# Patient Record
Sex: Female | Born: 1975 | Race: White | Hispanic: No | Marital: Married | Smoking: Never smoker
Health system: Southern US, Community
[De-identification: ages and names within clinical notes are randomized; demographics above are authoritative.]

## PROBLEM LIST (undated history)

## (undated) DIAGNOSIS — N390 Urinary tract infection, site not specified: Secondary | ICD-10-CM

## (undated) DIAGNOSIS — Z992 Dependence on renal dialysis: Secondary | ICD-10-CM

## (undated) DIAGNOSIS — T8859XA Other complications of anesthesia, initial encounter: Secondary | ICD-10-CM

## (undated) DIAGNOSIS — N2581 Secondary hyperparathyroidism of renal origin: Secondary | ICD-10-CM

## (undated) DIAGNOSIS — F419 Anxiety disorder, unspecified: Secondary | ICD-10-CM

## (undated) DIAGNOSIS — IMO0001 Reserved for inherently not codable concepts without codable children: Secondary | ICD-10-CM

## (undated) DIAGNOSIS — G43909 Migraine, unspecified, not intractable, without status migrainosus: Secondary | ICD-10-CM

## (undated) DIAGNOSIS — Z87442 Personal history of urinary calculi: Secondary | ICD-10-CM

## (undated) DIAGNOSIS — R519 Headache, unspecified: Secondary | ICD-10-CM

## (undated) DIAGNOSIS — T4145XA Adverse effect of unspecified anesthetic, initial encounter: Secondary | ICD-10-CM

## (undated) DIAGNOSIS — R51 Headache: Secondary | ICD-10-CM

## (undated) DIAGNOSIS — I10 Essential (primary) hypertension: Secondary | ICD-10-CM

## (undated) DIAGNOSIS — M1990 Unspecified osteoarthritis, unspecified site: Secondary | ICD-10-CM

## (undated) DIAGNOSIS — K219 Gastro-esophageal reflux disease without esophagitis: Secondary | ICD-10-CM

## (undated) DIAGNOSIS — E875 Hyperkalemia: Secondary | ICD-10-CM

## (undated) DIAGNOSIS — N186 End stage renal disease: Secondary | ICD-10-CM

## (undated) DIAGNOSIS — D509 Iron deficiency anemia, unspecified: Secondary | ICD-10-CM

## (undated) DIAGNOSIS — Z87440 Personal history of urinary (tract) infections: Secondary | ICD-10-CM

## (undated) HISTORY — PX: KIDNEY TRANSPLANT: SHX239

## (undated) HISTORY — DX: Essential (primary) hypertension: I10

## (undated) HISTORY — DX: Secondary hyperparathyroidism of renal origin: N25.81

## (undated) HISTORY — DX: Urinary tract infection, site not specified: N39.0

## (undated) HISTORY — PX: DILATION AND CURETTAGE OF UTERUS: SHX78

---

## 1998-09-30 ENCOUNTER — Emergency Department (HOSPITAL_COMMUNITY): Admission: EM | Admit: 1998-09-30 | Discharge: 1998-09-30 | Payer: Self-pay | Admitting: Emergency Medicine

## 1999-02-24 ENCOUNTER — Encounter: Payer: Self-pay | Admitting: Emergency Medicine

## 1999-02-24 ENCOUNTER — Emergency Department (HOSPITAL_COMMUNITY): Admission: EM | Admit: 1999-02-24 | Discharge: 1999-02-24 | Payer: Self-pay | Admitting: Emergency Medicine

## 1999-08-26 ENCOUNTER — Encounter: Payer: Self-pay | Admitting: Emergency Medicine

## 1999-08-26 ENCOUNTER — Emergency Department (HOSPITAL_COMMUNITY): Admission: EM | Admit: 1999-08-26 | Discharge: 1999-08-26 | Payer: Self-pay | Admitting: Emergency Medicine

## 2000-04-01 ENCOUNTER — Emergency Department (HOSPITAL_COMMUNITY): Admission: EM | Admit: 2000-04-01 | Discharge: 2000-04-01 | Payer: Self-pay | Admitting: Emergency Medicine

## 2001-03-09 ENCOUNTER — Encounter: Payer: Self-pay | Admitting: Obstetrics and Gynecology

## 2001-03-09 ENCOUNTER — Encounter (INDEPENDENT_AMBULATORY_CARE_PROVIDER_SITE_OTHER): Payer: Self-pay | Admitting: Specialist

## 2001-03-09 ENCOUNTER — Ambulatory Visit (HOSPITAL_COMMUNITY): Admission: AD | Admit: 2001-03-09 | Discharge: 2001-03-09 | Payer: Self-pay | Admitting: Obstetrics and Gynecology

## 2001-04-24 ENCOUNTER — Inpatient Hospital Stay (HOSPITAL_COMMUNITY): Admission: EM | Admit: 2001-04-24 | Discharge: 2001-04-28 | Payer: Self-pay | Admitting: Emergency Medicine

## 2001-04-27 ENCOUNTER — Encounter: Payer: Self-pay | Admitting: Internal Medicine

## 2001-05-09 HISTORY — PX: TUBAL LIGATION: SHX77

## 2001-08-13 ENCOUNTER — Emergency Department (HOSPITAL_COMMUNITY): Admission: EM | Admit: 2001-08-13 | Discharge: 2001-08-13 | Payer: Self-pay

## 2001-09-07 ENCOUNTER — Encounter: Payer: Self-pay | Admitting: Emergency Medicine

## 2001-09-07 ENCOUNTER — Emergency Department (HOSPITAL_COMMUNITY): Admission: EM | Admit: 2001-09-07 | Discharge: 2001-09-07 | Payer: Self-pay

## 2003-05-10 HISTORY — PX: LAPAROSCOPIC CHOLECYSTECTOMY: SUR755

## 2004-05-09 HISTORY — PX: ABDOMINAL HYSTERECTOMY: SHX81

## 2005-03-20 ENCOUNTER — Emergency Department: Payer: Self-pay | Admitting: Unknown Physician Specialty

## 2005-11-09 ENCOUNTER — Emergency Department: Payer: Self-pay | Admitting: Emergency Medicine

## 2005-11-21 ENCOUNTER — Emergency Department: Payer: Self-pay | Admitting: Unknown Physician Specialty

## 2007-02-03 ENCOUNTER — Emergency Department (HOSPITAL_COMMUNITY): Admission: EM | Admit: 2007-02-03 | Discharge: 2007-02-03 | Payer: Self-pay | Admitting: Emergency Medicine

## 2007-09-28 ENCOUNTER — Emergency Department (HOSPITAL_COMMUNITY): Admission: EM | Admit: 2007-09-28 | Discharge: 2007-09-28 | Payer: Self-pay | Admitting: Emergency Medicine

## 2008-02-29 ENCOUNTER — Emergency Department (HOSPITAL_COMMUNITY): Admission: EM | Admit: 2008-02-29 | Discharge: 2008-02-29 | Payer: Self-pay | Admitting: Emergency Medicine

## 2008-06-16 ENCOUNTER — Ambulatory Visit (HOSPITAL_COMMUNITY): Admission: RE | Admit: 2008-06-16 | Discharge: 2008-06-16 | Payer: Self-pay | Admitting: Neurosurgery

## 2009-04-17 ENCOUNTER — Emergency Department (HOSPITAL_COMMUNITY): Admission: EM | Admit: 2009-04-17 | Discharge: 2009-04-17 | Payer: Self-pay | Admitting: *Deleted

## 2010-02-16 ENCOUNTER — Emergency Department (HOSPITAL_COMMUNITY): Admission: EM | Admit: 2010-02-16 | Discharge: 2010-02-16 | Payer: Self-pay | Admitting: Emergency Medicine

## 2010-05-09 HISTORY — PX: SHOULDER ARTHROSCOPY W/ ROTATOR CUFF REPAIR: SHX2400

## 2010-07-22 LAB — CBC
HCT: 39.9 % (ref 36.0–46.0)
MCH: 31.5 pg (ref 26.0–34.0)
MCV: 90.8 fL (ref 78.0–100.0)
Platelets: 284 10*3/uL (ref 150–400)
RBC: 4.4 MIL/uL (ref 3.87–5.11)
WBC: 6.5 10*3/uL (ref 4.0–10.5)

## 2010-07-22 LAB — URINE CULTURE
Colony Count: NO GROWTH
Culture  Setup Time: 201110120140

## 2010-07-22 LAB — BASIC METABOLIC PANEL
BUN: 11 mg/dL (ref 6–23)
CO2: 23 mEq/L (ref 19–32)
Chloride: 108 mEq/L (ref 96–112)
Creatinine, Ser: 1.43 mg/dL — ABNORMAL HIGH (ref 0.4–1.2)
GFR calc Af Amer: 51 mL/min — ABNORMAL LOW (ref >60)
Potassium: 3 mEq/L — ABNORMAL LOW (ref 3.5–5.1)

## 2010-07-22 LAB — URINE MICROSCOPIC-ADD ON

## 2010-07-22 LAB — URINALYSIS, ROUTINE W REFLEX MICROSCOPIC
Bilirubin Urine: NEGATIVE
Glucose, UA: NEGATIVE mg/dL
Hgb urine dipstick: NEGATIVE
Ketones, ur: NEGATIVE mg/dL
Protein, ur: NEGATIVE mg/dL
pH: 6.5 (ref 5.0–8.0)

## 2010-07-22 LAB — DIFFERENTIAL
Eosinophils Absolute: 0 10*3/uL (ref 0.0–0.7)
Eosinophils Relative: 1 % (ref 0–5)
Lymphocytes Relative: 22 % (ref 12–46)
Lymphs Abs: 1.4 10*3/uL (ref 0.7–4.0)
Monocytes Absolute: 0.4 10*3/uL (ref 0.1–1.0)

## 2010-09-24 NOTE — Op Note (Signed)
[place]  Patient:    Valerie Yates, Valerie Yates Visit Number: [id number] MRN: [id number]          Service Type: OBS Location: [place] Attending Physician:  Renella Cunas Dictated by:   Daleen Bo Lyn Hollingshead, M.D. Proc. Date: 03/09/01 Admit Date:  03/09/2001 Discharge Date: 03/09/2001                             Operative Report  PREOPERATIVE DIAGNOSIS:       Incomplete abortion.  POSTOPERATIVE DIAGNOSIS:      Incomplete abortion.  OPERATION:                    Dilatation and evacuation.  SURGEON:                      Daleen Bo. Lyn Hollingshead, M.D.  ANESTHESIA:                   MAC with 1% Xylocaine paracervical block -                               Heriberto Antigua, M.D.  ESTIMATED BLOOD LOSS:         Less than 50 cc.  INDICATIONS AND CONSENT:      The patient is a 35 year old single white female, gravida 2, para 0, SAB 1, with an incomplete abortion.  Details are dictated in the History and Physical.  Blood type is Rh-positive.  Dilatation and evacuation was discussed with the patient.  Potential risks and complications have been discussed including, but not limited to infection, uterine perforation, bladder, bladder, or ureteral damage, bleeding requiring transfusion of blood products with possible transfusion reaction of HIV and hepatitis acquisition, DVT, PE, and pneumonia, hysterectomy, and synechiae formation.  All questions were answered and consent signed on the chart.  DESCRIPTION OF PROCEDURE:     The patient was taken to the operating room where she was given sedation.  She was then placed in the dorsolithotomy position where she was gently prepped.  The bladder was straight catheterized and she was draped in a sterile fashion.  The patient had been given IV antibiotics preoperatively.  Bivalve speculum was placed in the vagina and the anterior cervical lip was injected with 1% Xylocaine.  It was then grasped with the single tooth  tenaculum.  A total of 20 cc of 1% plain Xylocaine was used to place a paracervical block at the 2, 4, 5, 7, 8, and 10 oclock positions.  The cervix was gently progressively dilated to a 31 Pratt dilator. A #7 curved curet is placed in the uterine cavity and suction curettage was carried out.  Alternating suction and sharp curettage was carried out until the cavity feels clean.  Twenty units of Pitocin are added to a new bag of IV fluids after the initial pass of the suction curet.  Good hemostasis is noted. All instruments are removed.  All counts are correct.  The patient is awakened and taken to the recovery room in stable condition. Blood type is Rh-positive.  The patient will be discharged home on Methergine 0.2 mg p.o. t.i.d. for two days, Darvocet-N 100 #6 one p.o. q.6h. p.r.n., ibuprofen 600 mg p.o. q.6h. p.r.n., and doxycycline 100 mg p.o. b.i.d. Followup is in the office in two weeks. Dictated by:  Daleen Bo Lyn Hollingshead, M.D. Attending Physician:  Renella Cunas DD:  03/09/01 TD:  03/12/01 Job: [id number]

## 2010-09-24 NOTE — Discharge Summary (Signed)
[place]  Patient:    Valerie Yates, Valerie Yates Visit Number: [id number] MRN: [id number]          Service Type: MED Location: [id number] Attending Physician:  Thomes Lolling Dictated by:   Harlow Asa, M.D. Admit Date:  04/24/2001 Discharge Date: 04/28/2001                             Discharge Summary  PROBLEM LIST:  Pyelonephritis.  MEDICATIONS AT DISCHARGE: 1. Tequin 400 mg x5 days. 2. Vicodin 5/500 one tablet q.6h. p.r.n. pain. 3. Tylenol as needed for pain.  The patient was reminded that Vicodin has    Tylenol in it and to include this in the total amount of Tylenol she was    taking in a day. 4. Phenergan 25 mg q.6h. p.r.n. pain.  ADMISSION HISTORY:  Ms. Valerie Yates is a 35 year old woman with little significant past medical history who came to the emergency room with three to four day history of increasing frequency, urgency and dysuria in addition to headache, fever and chills.  These came on very suddenly and got much worse in the time before the admission. She had several episodes of nausea and vomiting the night before admission and was unable to keep anything down. She also reported right flank pain and abdominal pain over the same time.  The pain was crampy but constant.  She reported a history of urinary tract infections but never anything quite that severe.  PHYSICAL EXAMINATION:  GENERAL APPEARANCE:  This is a 35 year old woman in moderate distress. She was sleepy but oriented and appropriate.  VITAL SIGNS:  Admission temperature 102.5 with T-max of 104, blood pressure 122/80, pulse 140, respiratory rate 20, O2 sat 100% on room air.  CHEST:  Clear to auscultation bilaterally, positive CVA tenderness on the right.  CARDIOVASCULAR:  Her heart was tachy but regular.  ABDOMEN:  Tender throughout the right side to deep palpation but was nondistended and had positive bowel sounds and no masses or  organomegaly.  EXTREMITIES: There was no edema.  The remainder of the exam was unremarkable except that she was multiply tattooed.  LABS ON ADMISSION:  White count 7.9, hemoglobin 14.3, platelets 246.  Sodium 135, potassium 3.0, chloride 103, bicarb 22, BUN 5, creatinine 0.7, glucose 120.  HOSPITAL COURSE BY PROBLEM LIST:  FEVER/CHILLS/FLANK PAIN:  This was consistent with pyelonephritis, almost certainly caused by filiform bacteria. She was admitted to the hospital and started on Tequin 400 mg IV q.d. which will be changed to p.o. when she was able to tolerate it.  Blood and urine cultures were also drawn.  Her relative leukopenia of 7.9 was odd given her apparent pyelonephritis.  She consented to an HIV test which was negative.  On the day after admission, her T-max was 101.2 and she felt somewhat better. She had some nausea and vomiting overnight.  Blood and urine cultures were still pending.  On April 26, 2001, she continued to improve slowly and her headache became worse.  She was started on MS-Contin for pain control with IV morphine for breakthrough pain.  On April 27, 2001, the headache remained and had not gotten any better but the pain in her abdomen and flank was nearly gone.  Urine culture showed E. coli and Klebsiella, both of which were broadly sensitive.  Her T-max was 102.4.  Despite her headaches, her neck was supple and she showed no additional signs  of meningitis.  She later complained of point tenderness in the back of her head and epidural abscess was thought to be a remote possibility so an MRI of her head was ordered.  MRI of the head was performed and did not show an epidural abscess. However, it did show a 6 mm nonenhancing abnormality in the left centrum semiovale which followed a fluid signal.  Differential through the radiologist for this included a small area of encephalomalacia secondary to previous trauma or ischemia or possibly a neural  epithelial cyst of benign significance and abscess was thought to be unlikely and the low grade neoplastic process could not be entirely excluded.  The recommendation was to follow up an MRI in four to six months.  With the MRI showing no acute abnormalities and her being afebrile overnight on the April 27, 2001, she was discharged home on April 28, 2001, with a home course of Tequin.  FOLLOW-UP:  Ms. Valerie Yates was to call [phone] to make an appointment in January to see Harlow Asa, M.D., for routine care and for follow-up.  She was also strongly encouraged to remember to follow up on the MRI in four to six months. Dictated by:   Harlow Asa, M.D. Attending Physician:  Thomes Lolling DD:  06/12/01 TD:  06/12/01 Job: [id number]

## 2010-09-24 NOTE — H&P (Signed)
[facility]  Patient:    Valerie Yates, Valerie Yates Visit Number: [id number] MRN: [id number]          Service Type: Attending:  Daleen Bo. Lyn Hollingshead, M.D. Dictated by:   Daleen Bo Lyn Hollingshead, M.D. Adm. Date:  03/09/01                           History and Physical  CHIEF COMPLAINT:              Probable incomplete miscarriage and pelvic pain.  HISTORY OF PRESENT ILLNESS:   This patient is a 35 year old single white female, G2, P0, with an LMP of December 18, 2000.  She initially presented to my office on January 29, 2001 with complaints of cramping but no vaginal bleeding.  Ultrasound on that day revealed an intrauterine gestational sac consistent with 6 weeks and 0 days.  There was a yolk sac seen but no fetal pole.  Blood type is O-positive and quantitative hCG on January 29, 2001 was 18,914.  Repeat hCG on January 31, 2001 was 23,801.  Followup ultrasound on February 05, 2001 reveals probable two yolk sacs consistent with an early twin gestation.  The primary gestational sac measurement is consistent with 7 weeks and 0 days.  A fetal pole could not be seen on the ultrasound of February 05, 2001.  There was a question of a heart rate in the 70s noted on the ultrasound on February 02, 2001.  On February 05, 2001, a right corpus luteum cyst measuring 1.8 cm in greatest dimension was also noted.  Finally, quantitative hCG on February 06, 2001 is [id number].  This was carefully discussed with the patient.  As of February 08, 2001, patient reported a couple of episodes of cramping which have resolved and no heavy bleeding.  Options of management were discussed and the patient wanted to pursue expectant management.  She was asked to return five or six days later for a repeat quantitative hCG.  Patient then presents to our office the next time on March 08, 2001.  She reported pelvic pain and bad dyspareunia upon intercourse one or two days prior.  She reports the  "condom" had broken and [facility]. Quantitative hCG on March 08, 2001 is 42.8.  She was advised not to take the birth control pills at that point.  Evaluation in the office today reveals the patient to be complaining of headaches, nausea, but no vomiting, fatigue with swelling of her legs.  She has had bad pelvic pain which comes and goes, sometimes doubling her up on the bed.  She states her abdomen feels "quishy" when pressure is applied.  She had intercourse, as mentioned above, which was extremely painful.  While she feels chilled sometimes, she denies shaking chills.  Has had normal bowel and bladder function and is able to tolerate liquids, although she does have some nausea with food.  Her last food was a biscuit at 9:30 this morning and a Mountain Dew at 11:30 this morning.  PAST MEDICAL HISTORY:         1. History of "bad nerves" with no current                                  therapy.  2. History of chronic anemia.                               3. All four wisdom teeth are now impacted and                                  need to be removed.  PAST SURGICAL HISTORY:        Surgery on her right arm.  SOCIAL HISTORY:               One pack of cigarettes every four days.  Social consumption of alcohol.  Denies drug abuse.  MEDICATIONS:                  Prenatal vitamins and Advil p.r.n.  ALLERGIES:                    ERYTHROMYCIN and CODEINE leading to unknown reactions; PENICILLIN leading to hives.  She has had Keflex, amoxicillin and Vicodin, all without problems.  FAMILY HISTORY:               Noncontributory.  REVIEW OF SYSTEMS:            Negative except as above.  PHYSICAL EXAMINATION:  VITAL SIGNS:                  Blood pressure 108/64, temperature 99.2 in the office.  HEENT/NECK:                   Without thyromegaly.  LUNGS:                        Clear to  auscultation.  HEART:                        Regular rate and rhythm.  BACK:                         Without CVA tenderness.  BREASTS:                      Not examined.  ABDOMEN:                      Flat, soft, with normal bowel sounds, mild-to-moderate suprapubic tenderness without rebound.  PELVIC:                       Vulva, vagina and cervix without lesion. Malodorous brown discharge from the cervix.  Uterus is anteverted, 4 to 6 weeks in size, mobile and mild-to-moderately tender.  Adnexa mildly tender without masses.  EXTREMITIES:                  Lower extremities without cords, tenderness, edema.  Negative Homans bilaterally.  LABORATORIES:                 Blood type is O-positive.  ASSESSMENT:                   Probable incomplete abortion, possible endometritis.  PLAN:                         Observation, IV fluids, Cefotan 2 g IV q.12h., CBC,  quantitative hCG, hold-clot ordered, gonorrhea and Chlamydia cultures of the cervix will be done prior to antibiotics, pelvic ultrasound, n.p.o., will reevaluate pending above data. Dictated by:   Daleen Bo Lyn Hollingshead, M.D. Attending:  Daleen Bo Lyn Hollingshead, M.D. DD:  03/09/01 TD:  03/09/01 Job: [id number]

## 2011-02-17 LAB — COMPREHENSIVE METABOLIC PANEL
AST: 79 — ABNORMAL HIGH
Albumin: 3.9
BUN: 10
Calcium: 9.1
Chloride: 106
Creatinine, Ser: 0.97
GFR calc Af Amer: >60
GFR calc non Af Amer: >60
Total Bilirubin: 0.7

## 2011-02-17 LAB — CBC
HCT: 41.8
MCHC: 34.3
MCV: 88
Platelets: 264
RDW: 12.3
WBC: 5.6

## 2011-02-17 LAB — DIFFERENTIAL
Basophils Absolute: 0
Lymphocytes Relative: 24
Lymphs Abs: 1.3
Monocytes Absolute: 0.7
Neutro Abs: 3.5

## 2011-02-17 LAB — URINALYSIS, ROUTINE W REFLEX MICROSCOPIC
Nitrite: NEGATIVE
Specific Gravity, Urine: 1.014
Urobilinogen, UA: 1
pH: 6

## 2011-02-17 LAB — URINE MICROSCOPIC-ADD ON

## 2011-02-17 LAB — LIPASE, BLOOD: Lipase: 30

## 2013-08-20 ENCOUNTER — Emergency Department: Payer: Self-pay | Admitting: Emergency Medicine

## 2013-08-21 LAB — URINALYSIS, COMPLETE
BACTERIA: NONE SEEN
BILIRUBIN, UR: NEGATIVE
Glucose,UR: NEGATIVE mg/dL (ref 0–75)
KETONE: NEGATIVE
NITRITE: NEGATIVE
PH: 6 (ref 4.5–8.0)
PROTEIN: NEGATIVE
SPECIFIC GRAVITY: 1.009 (ref 1.003–1.030)
Squamous Epithelial: 1

## 2014-02-09 ENCOUNTER — Emergency Department (HOSPITAL_COMMUNITY)
Admission: EM | Admit: 2014-02-09 | Discharge: 2014-02-09 | Disposition: A | Discharge status: Home / Self Care | Payer: BC Managed Care – PPO | Attending: Emergency Medicine | Admitting: Emergency Medicine

## 2014-02-09 ENCOUNTER — Encounter (HOSPITAL_COMMUNITY): Payer: Self-pay | Admitting: Emergency Medicine

## 2014-02-09 DIAGNOSIS — Y9289 Other specified places as the place of occurrence of the external cause: Secondary | ICD-10-CM | POA: Diagnosis not present

## 2014-02-09 DIAGNOSIS — N189 Chronic kidney disease, unspecified: Secondary | ICD-10-CM | POA: Diagnosis not present

## 2014-02-09 DIAGNOSIS — X58XXXA Exposure to other specified factors, initial encounter: Secondary | ICD-10-CM | POA: Insufficient documentation

## 2014-02-09 DIAGNOSIS — Y9389 Activity, other specified: Secondary | ICD-10-CM | POA: Insufficient documentation

## 2014-02-09 DIAGNOSIS — S39012A Strain of muscle, fascia and tendon of lower back, initial encounter: Secondary | ICD-10-CM | POA: Diagnosis not present

## 2014-02-09 DIAGNOSIS — Z880 Allergy status to penicillin: Secondary | ICD-10-CM | POA: Diagnosis not present

## 2014-02-09 DIAGNOSIS — S3992XA Unspecified injury of lower back, initial encounter: Secondary | ICD-10-CM | POA: Diagnosis present

## 2014-02-09 MED ORDER — DIAZEPAM 5 MG PO TABS
5.0000 mg | ORAL_TABLET | Freq: Once | ORAL | Status: AC
  Administered 2014-02-09: 5 mg via ORAL
  Filled 2014-02-09: qty 1

## 2014-02-09 MED ORDER — DIAZEPAM 5 MG PO TABS: 5.0000 mg | ORAL_TABLET | Freq: Two times a day (BID) | ORAL | Status: DC

## 2014-02-09 MED ORDER — HYDROCODONE-ACETAMINOPHEN 5-325 MG PO TABS
2.0000 | ORAL_TABLET | Freq: Once | ORAL | Status: AC
  Administered 2014-02-09: 2 via ORAL
  Filled 2014-02-09: qty 2

## 2014-02-09 NOTE — ED Notes (Signed)
Declined W/C at D/C and was escorted to lobby by RN. 

## 2014-02-09 NOTE — ED Notes (Signed)
Pt reports back pain since SAT.

## 2014-02-09 NOTE — Discharge Instructions (Signed)
Take Valium as needed as directed for muscle spasm. No driving or operating heavy machinery while taking valium. This medication may cause drowsiness. Rest, continue to apply ice alternated with heat, and take tylenol. Spasticity Spasticity is a condition in which certain muscles contract continuously. This causes stiffness or tightness of the muscles. It may interfere with movement, speech, and manner of walking. CAUSES  This condition is usually caused by damage to the portion of the brain or spinal cord that controls voluntary movement. It may occur in association with:  Spinal cord injury.  Multiple sclerosis.  Cerebral palsy.  Brain damage due to lack of oxygen.  Brain trauma.  Severe head injury.  Metabolic diseases such as:  Adrenoleukodystrophy.  ALS [place] Cerise Gehrig's disease).  Phenylketonuria. SYMPTOMS   Increased muscle tone (hypertonicity).  A series of rapid muscle contractions (clonus).  Exaggerated deep tendon reflexes.  Muscle spasms.  Involuntary crossing of the legs (scissoring).  Fixed joints. The degree of spasticity varies. It ranges from mild muscle stiffness to severe, painful, and uncontrollable muscle spasms. It can interfere with rehabilitation in patients with certain disorders. It often interferes with daily activities. TREATMENT  Treatment may include:  Medications.  Physical therapy regimens. They may include muscle stretching and range of motion exercises. These help prevent shrinkage or shortening of muscles. They also help reduce the severity of symptoms.  Surgery. This may be recommended for tendon release or to sever the nerve-muscle pathway. PROGNOSIS  The outcome for those with spasticity depends on:  Severity of the spasticity.  Associated disorder(s). Document Released: 04/15/2002 Document Revised: 07/18/2011 Document Reviewed: 07/09/2013 [facility] Patient Information 2015 [place], [place]. This information is not intended to  replace advice given to you by your health care provider. Make sure you discuss any questions you have with your health care provider. Lumbosacral Strain Lumbosacral strain is a strain of any of the parts that make up your lumbosacral vertebrae. Your lumbosacral vertebrae are the bones that make up the lower third of your backbone. Your lumbosacral vertebrae are held together by muscles and tough, fibrous tissue (ligaments).  CAUSES  A sudden blow to your back can cause lumbosacral strain. Also, anything that causes an excessive stretch of the muscles in the low back can cause this strain. This is typically seen when people exert themselves strenuously, fall, lift heavy objects, bend, or crouch repeatedly. RISK FACTORS  Physically demanding work.  Participation in pushing or pulling sports or sports that require a sudden twist of the back (tennis, golf, baseball).  Weight lifting.  Excessive lower back curvature.  Forward-tilted pelvis.  Weak back or abdominal muscles or both.  Tight hamstrings. SIGNS AND SYMPTOMS  Lumbosacral strain may cause pain in the area of your injury or pain that moves (radiates) down your leg.  DIAGNOSIS Your health care provider can often diagnose lumbosacral strain through a physical exam. In some cases, you may need tests such as X-ray exams.  TREATMENT  Treatment for your lower back injury depends on many factors that your clinician will have to evaluate. However, most treatment will include the use of anti-inflammatory medicines. HOME CARE INSTRUCTIONS   Avoid hard physical activities (tennis, racquetball, waterskiing) if you are not in proper physical condition for it. This may aggravate or create problems.  If you have a back problem, avoid sports requiring sudden body movements. Swimming and walking are generally safer activities.  Maintain good posture.  Maintain a healthy weight.  For acute conditions, you may put ice on the  injured area.  Put  ice in a plastic bag.  Place a towel between your skin and the bag.  Leave the ice on for 20 minutes, 2-3 times a day.  When the low back starts healing, stretching and strengthening exercises may be recommended. SEEK MEDICAL CARE IF:  Your back pain is getting worse.  You experience severe back pain not relieved with medicines. SEEK IMMEDIATE MEDICAL CARE IF:   You have numbness, tingling, weakness, or problems with the use of your arms or legs.  There is a change in bowel or bladder control.  You have increasing pain in any area of the body, including your belly (abdomen).  You notice shortness of breath, dizziness, or feel faint.  You feel sick to your stomach (nauseous), are throwing up (vomiting), or become sweaty.  You notice discoloration of your toes or legs, or your feet get very cold. MAKE SURE YOU:   Understand these instructions.  Will watch your condition.  Will get help right away if you are not doing well or get worse. Document Released: 02/02/2005 Document Revised: 04/30/2013 Document Reviewed: 12/12/2012 [facility] Patient Information 2015 [place], [place]. This information is not intended to replace advice given to you by your health care provider. Make sure you discuss any questions you have with your health care provider.

## 2014-02-09 NOTE — ED Provider Notes (Signed)
CSN: [id number]     Arrival date & time 02/09/14  [id number] History   First MD Initiated Contact with Patient 02/09/14 0902     Chief Complaint  Patient presents with  . Back Pain     (Consider location/radiation/quality/duration/timing/severity/associated sxs/prior Treatment) HPI Comments: This is a 38 year old female with a past medical history of chronic kidney disease who presents to the emergency department complaining of back pain x1 day. Patient reports yesterday she was bending over to this abrupt her right boot when she felt a "twisting" sensation on the right side of her back. She has been experiencing constant pain since, described as sharp, worse when leaning on her right side, relieved by sitting on the left. Pain radiates into her right thigh.. States this does not feel like her prior kidney pain. Denies saddle anesthesia, numbness or tingling down her extremities, loss of control bowels or bladder. She has tried taking Tylenol with minimal relief.  The history is provided by the patient.    History reviewed. No pertinent past medical history. History reviewed. No pertinent past surgical history. History reviewed. No pertinent family history. History  Substance Use Topics  . Smoking status: Never Smoker   . Smokeless tobacco: Never Used  . Alcohol Use: No   OB History   Grav Para Term Preterm Abortions TAB SAB Ect Mult Living                 Review of Systems  Musculoskeletal: Positive for back pain.  All other systems reviewed and are negative.     Allergies  Penicillins  Home Medications   Prior to Admission medications   Medication Sig Start Date End Date Taking? Authorizing Provider  diazepam (VALIUM) 5 MG tablet Take 1 tablet (5 mg total) by mouth 2 (two) times daily. 02/09/14   Illene Labrador, PA-C   BP 150/100  Pulse 69  Temp(Src) 98.7 F (37.1 C) (Oral)  Resp 14  Ht 5\' 8"  (1.727 m)  Wt 170 lb (77.111 kg)  BMI 25.85 kg/m2  SpO2 100% Physical Exam    Nursing note and vitals reviewed. Constitutional: She is oriented to person, place, and time. She appears well-developed and well-nourished. No distress.  HENT:  Head: Normocephalic and atraumatic.  Mouth/Throat: Oropharynx is clear and moist.  Eyes: Conjunctivae are normal.  Neck: Normal range of motion. Neck supple. No spinous process tenderness and no muscular tenderness present.  Cardiovascular: Normal rate, regular rhythm and normal heart sounds.   Pulmonary/Chest: Effort normal and breath sounds normal. No respiratory distress.  Abdominal: Soft. Bowel sounds are normal. There is no tenderness.  Musculoskeletal: Normal range of motion. She exhibits no edema.       Back:  No spinous process tenderness.  Neurological: She is alert and oriented to person, place, and time. She has normal strength.  Strength lower extremities 5/5 and equal bilateral. Sensation intact. Normal gait.  Skin: Skin is warm and dry. No rash noted. She is not diaphoretic.  Psychiatric: She has a normal mood and affect. Her behavior is normal.    ED Course  Procedures (including critical care time) Labs Review Labs Reviewed - No data to display  Imaging Review No results found.   EKG Interpretation None      MDM   Final diagnoses:  Lumbosacral strain, initial encounter   Patient with back pain after mechanical injury. She is nontoxic appearing and in no apparent distress. Afebrile, hypertensive, vitals otherwise stable. No red flags concerning patient's back  pain. No s/s of central cord compression or cauda equina. Lower extremities are neurovascularly intact and patient is ambulating without difficulty. Treat with valium, continue tylenol. Advised ice/heat. Pain meds given in ED, husband is driving. Stable for d/c. Return precautions given. Patient states understanding of treatment care plan and is agreeable.  Illene Labrador, PA-C 02/09/14 [phone]

## 2014-02-11 NOTE — ED Provider Notes (Signed)
Medical screening examination/treatment/procedure(s) were performed by non-physician practitioner and as supervising physician I was immediately available for consultation/collaboration.  Richarda Blade, MD 02/11/14 [phone]

## 2014-03-26 ENCOUNTER — Encounter: Payer: Self-pay | Admitting: Vascular Surgery

## 2014-03-26 ENCOUNTER — Other Ambulatory Visit: Payer: Self-pay

## 2014-03-26 DIAGNOSIS — Z01810 Encounter for preprocedural cardiovascular examination: Secondary | ICD-10-CM

## 2014-03-26 DIAGNOSIS — N184 Chronic kidney disease, stage 4 (severe): Secondary | ICD-10-CM

## 2014-04-08 HISTORY — PX: TRANSTHORACIC ECHOCARDIOGRAM: SHX275

## 2014-04-25 ENCOUNTER — Encounter (HOSPITAL_COMMUNITY): Payer: BC Managed Care – PPO

## 2014-04-25 ENCOUNTER — Ambulatory Visit: Payer: BC Managed Care – PPO | Admitting: Vascular Surgery

## 2014-04-25 ENCOUNTER — Other Ambulatory Visit (HOSPITAL_COMMUNITY): Payer: BC Managed Care – PPO

## 2014-08-08 HISTORY — PX: NM MYOVIEW LTD: HXRAD82

## 2014-10-07 ENCOUNTER — Encounter (HOSPITAL_COMMUNITY): Payer: Self-pay | Admitting: Emergency Medicine

## 2014-10-07 ENCOUNTER — Emergency Department (HOSPITAL_COMMUNITY): Payer: BLUE CROSS/BLUE SHIELD

## 2014-10-07 DIAGNOSIS — Z87440 Personal history of urinary (tract) infections: Secondary | ICD-10-CM | POA: Insufficient documentation

## 2014-10-07 DIAGNOSIS — Z862 Personal history of diseases of the blood and blood-forming organs and certain disorders involving the immune mechanism: Secondary | ICD-10-CM | POA: Diagnosis not present

## 2014-10-07 DIAGNOSIS — Z8639 Personal history of other endocrine, nutritional and metabolic disease: Secondary | ICD-10-CM | POA: Insufficient documentation

## 2014-10-07 DIAGNOSIS — N184 Chronic kidney disease, stage 4 (severe): Secondary | ICD-10-CM | POA: Insufficient documentation

## 2014-10-07 DIAGNOSIS — R0789 Other chest pain: Secondary | ICD-10-CM | POA: Diagnosis not present

## 2014-10-07 DIAGNOSIS — I129 Hypertensive chronic kidney disease with stage 1 through stage 4 chronic kidney disease, or unspecified chronic kidney disease: Secondary | ICD-10-CM | POA: Diagnosis not present

## 2014-10-07 DIAGNOSIS — R1111 Vomiting without nausea: Secondary | ICD-10-CM | POA: Insufficient documentation

## 2014-10-07 DIAGNOSIS — Z79899 Other long term (current) drug therapy: Secondary | ICD-10-CM | POA: Diagnosis not present

## 2014-10-07 DIAGNOSIS — Z87891 Personal history of nicotine dependence: Secondary | ICD-10-CM | POA: Insufficient documentation

## 2014-10-07 DIAGNOSIS — Z880 Allergy status to penicillin: Secondary | ICD-10-CM | POA: Diagnosis not present

## 2014-10-07 DIAGNOSIS — R079 Chest pain, unspecified: Secondary | ICD-10-CM | POA: Diagnosis present

## 2014-10-07 DIAGNOSIS — R51 Headache: Secondary | ICD-10-CM | POA: Insufficient documentation

## 2014-10-07 LAB — BASIC METABOLIC PANEL
Anion gap: 10 (ref 5–15)
BUN: 59 mg/dL — ABNORMAL HIGH (ref 6–20)
CO2: 23 mmol/L (ref 22–32)
Calcium: 8.2 mg/dL — ABNORMAL LOW (ref 8.9–10.3)
Chloride: 105 mmol/L (ref 101–111)
Creatinine, Ser: 4.42 mg/dL — ABNORMAL HIGH (ref 0.44–1.00)
GFR calc Af Amer: 13 mL/min — ABNORMAL LOW (ref >60)
GFR calc non Af Amer: 12 mL/min — ABNORMAL LOW (ref >60)
GLUCOSE: 92 mg/dL (ref 65–99)
POTASSIUM: 4.2 mmol/L (ref 3.5–5.1)
Sodium: 138 mmol/L (ref 135–145)

## 2014-10-07 LAB — CBC
HEMATOCRIT: 32.2 % — AB (ref 36.0–46.0)
Hemoglobin: 10.4 g/dL — ABNORMAL LOW (ref 12.0–15.0)
MCH: 29.9 pg (ref 26.0–34.0)
MCHC: 32.3 g/dL (ref 30.0–36.0)
MCV: 92.5 fL (ref 78.0–100.0)
PLATELETS: 258 10*3/uL (ref 150–400)
RBC: 3.48 MIL/uL — AB (ref 3.87–5.11)
RDW: 13 % (ref 11.5–15.5)
WBC: 12.9 10*3/uL — ABNORMAL HIGH (ref 4.0–10.5)

## 2014-10-07 LAB — I-STAT TROPONIN, ED: TROPONIN I, POC: 0 ng/mL (ref 0.00–0.08)

## 2014-10-07 NOTE — ED Notes (Signed)
Pt has stage 5 kidney disease and her bp is elevated today. Pt c/o chest heaviness, difficulty breathing and pain all over. Denies fevers/chills.

## 2014-10-08 ENCOUNTER — Emergency Department (HOSPITAL_COMMUNITY)
Admission: EM | Admit: 2014-10-08 | Discharge: 2014-10-08 | Disposition: A | Discharge status: Home / Self Care | Payer: BLUE CROSS/BLUE SHIELD | Attending: Emergency Medicine | Admitting: Emergency Medicine

## 2014-10-08 DIAGNOSIS — R51 Headache: Secondary | ICD-10-CM

## 2014-10-08 DIAGNOSIS — R0789 Other chest pain: Secondary | ICD-10-CM

## 2014-10-08 DIAGNOSIS — R519 Headache, unspecified: Secondary | ICD-10-CM

## 2014-10-08 MED ORDER — ACETAMINOPHEN 325 MG PO TABS
650.0000 mg | ORAL_TABLET | Freq: Once | ORAL | Status: AC
  Administered 2014-10-08: 650 mg via ORAL
  Filled 2014-10-08: qty 2

## 2014-10-08 MED ORDER — DIPHENHYDRAMINE HCL 50 MG/ML IJ SOLN
25.0000 mg | Freq: Once | INTRAMUSCULAR | Status: AC
  Administered 2014-10-08: 25 mg via INTRAVENOUS
  Filled 2014-10-08: qty 1

## 2014-10-08 MED ORDER — HYDROMORPHONE HCL 1 MG/ML IJ SOLN
0.5000 mg | Freq: Once | INTRAMUSCULAR | Status: AC
  Administered 2014-10-08: 0.5 mg via INTRAVENOUS
  Filled 2014-10-08: qty 1

## 2014-10-08 MED ORDER — SODIUM CHLORIDE 0.9 % IV BOLUS (SEPSIS)
500.0000 mL | Freq: Once | INTRAVENOUS | Status: AC
  Administered 2014-10-08: 500 mL via INTRAVENOUS

## 2014-10-08 MED ORDER — METOCLOPRAMIDE HCL 5 MG/ML IJ SOLN
5.0000 mg | Freq: Once | INTRAMUSCULAR | Status: AC
  Administered 2014-10-08: 5 mg via INTRAVENOUS
  Filled 2014-10-08: qty 2

## 2014-10-08 NOTE — ED Provider Notes (Signed)
CSN: [id number]     Arrival date & time 10/07/14  1944 History  This chart was scribed for Valerie Pert, MD by Meriel Pica, ED Scribe. This patient was seen in room A12C/A12C and the patient's care was started 12:46 AM.    Chief Complaint  Patient presents with  . Hypertension  . Chest Pain  . Headache   Patient is a 39 y.o. female presenting with hypertension, chest pain, and headaches. The history is provided by the patient. No language interpreter was used.  Hypertension Associated symptoms include chest pain and headaches. Pertinent negatives include no abdominal pain and no shortness of breath.  Chest Pain Associated symptoms: headache and vomiting   Associated symptoms: no abdominal pain, no back pain, no cough, no fever, no nausea and no shortness of breath   Headache Pain location:  Generalized Radiates to:  Does not radiate Severity currently:  10/10 Onset quality:  Gradual Timing:  Constant Progression:  Worsening Chronicity:  Recurrent Associated symptoms: myalgias and vomiting   Associated symptoms: no abdominal pain, no back pain, no congestion, no cough, no diarrhea, no fever, no nausea, no neck pain and no sore throat     HPI Comments: Valerie Yates is a 39 y.o. female, with a PMHx of migraines and CKD, who presents to the Emergency Department complaining of a gradually worsening, 10/10 headache onset earlier today after she had her blood drawn. Pt notes she is on the transplant list for a kidney. She reports associated vomiting, myalgias, HTN, and CP. Pt's BP is currently 156/52mmHg. The pt notes that she had CP earlier today but that it has since resolved. She reports that she does not believe the myalgias to be related to her current headache when compared with previous similar symptoms. Pt is on 80 mg of Lasix BID. She has not taken anything for her headache pta. She denies fevers, diarrhea, dysuria, or anuria. She reports she has no PMHx of recent surgeries, use of  oral estrogen contraception, history of blood clots, or recent travels. Pt reports she has had a hysterectomy.   Past Medical History  Diagnosis Date  . Chronic kidney disease (CKD)     Stage IV  . Hypertension   . Anemia   . 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 UTI (lower urinary tract infection)     recurrent   History reviewed. No pertinent past surgical history. Family History  Problem Relation Age of Onset  . Cancer Mother     Breast   History  Substance Use Topics  . Smoking status: Former Smoker    Quit date: 11/24/2002  . Smokeless tobacco: Never Used  . Alcohol Use: No   OB History    No data available     Review of Systems  Constitutional: Negative for fever.  HENT: Negative for congestion, rhinorrhea and sore throat.   Eyes: Negative for visual disturbance.  Respiratory: Negative for cough and shortness of breath.   Cardiovascular: Positive for chest pain. Negative for leg swelling.  Gastrointestinal: Positive for vomiting. Negative for nausea, abdominal pain and diarrhea.  Genitourinary: Negative for dysuria.  Musculoskeletal: Positive for myalgias. Negative for back pain and neck pain.  Skin: Negative for rash.  Neurological: Positive for headaches.  Hematological: Does not bruise/bleed easily.  Psychiatric/Behavioral: Negative for confusion.  All other systems reviewed and are negative.   Allergies  Penicillins; Erythromycin; and Morphine sulfate  Home Medications   Prior to Admission medications  Medication Sig Start Date End Date Taking? Authorizing Provider  ALPRAZolam Duanne Moron) 0.5 MG tablet Take 0.5 mg by mouth 3 (three) times daily as needed for anxiety.   Yes Historical Provider, MD  carvedilol (COREG) 25 MG tablet Take 25 mg by mouth 2 (two) times daily with a meal.   Yes Historical Provider, MD  furosemide (LASIX) 40 MG tablet Take 80 mg by mouth 2 (two) times daily.    Yes Historical Provider, MD    BP 146/98 mmHg  Pulse 74  Temp(Src) 97.9 F (36.6 C) (Oral)  Resp 18  Ht 5\' 6"  (1.676 m)  Wt 176 lb 6.4 oz (80.015 kg)  BMI 28.49 kg/m2  SpO2 100%  Physical Exam  Constitutional: She is oriented to person, place, and time. She appears well-developed and well-nourished. No distress.  HENT:  Head: Normocephalic and atraumatic.  Mouth/Throat: Oropharynx is clear and moist.  Eyes: Conjunctivae and EOM are normal. Pupils are equal, round, and reactive to light.  Neck: Normal range of motion. Neck supple. No tracheal deviation present.  Cardiovascular: Normal rate and normal heart sounds.  Exam reveals no gallop and no friction rub.   No murmur heard. Pulmonary/Chest: Breath sounds normal. No respiratory distress.  Abdominal: Soft. She exhibits no distension. There is no tenderness. There is no rebound and no guarding.  Musculoskeletal: Normal range of motion. She exhibits no edema or tenderness.  Neurological: She is alert and oriented to person, place, and time.  alert, oriented x3 speech: normal in context and clarity memory: intact grossly cranial nerves II-XII: intact motor strength: full proximally and distally no involuntary movements or tremors sensation: intact to light touch diffusely  cerebellar: finger-to-nose and heel-to-shin intact gait: normal forwards and backwards  Skin: Skin is warm and dry.  Psychiatric: She has a normal mood and affect. Her behavior is normal.  Nursing note and vitals reviewed.   ED Course  Procedures  DIAGNOSTIC STUDIES: Oxygen Saturation is 100% on RA, normal by my interpretation.    COORDINATION OF CARE: 12:52 AM Discussed treatment plan which includes to give fluids, pain medication, and benadryl with pt. Pt acknowledges and agrees to plan.   Labs Review Labs Reviewed  CBC - Abnormal; Notable for the following:    WBC 12.9 (*)    RBC 3.48 (*)    Hemoglobin 10.4 (*)    HCT 32.2 (*)    All other components within normal limits   BASIC METABOLIC PANEL - Abnormal; Notable for the following:    BUN 59 (*)    Creatinine, Ser 4.42 (*)    Calcium 8.2 (*)    GFR calc non Af Amer 12 (*)    GFR calc Af Amer 13 (*)    All other components within normal limits  I-STAT TROPOININ, ED    Imaging Review Dg Chest 2 View  10/07/2014   CLINICAL DATA:  Substernal chest pain and pressure associated with headache and shortness of breath. Current history of hypertension.  EXAM: CHEST  2 VIEW  COMPARISON:  06/17/2013 and earlier, [place]: Cardiomediastinal silhouette unremarkable, unchanged. Lungs clear. Bronchovascular markings normal. Pulmonary vascularity normal. No visible pleural effusions. No pneumothorax. Visualized bony thorax intact.  IMPRESSION: No acute cardiopulmonary disease.  Stable examination.   Electronically Signed   By: Evangeline Dakin M.D.   On: 10/07/2014 20:59     EKG Interpretation   Date/Time:  Tuesday Oct 07 2014 19:58:45 EDT Ventricular Rate:  79 PR Interval:  146 QRS Duration:  64 QT Interval:  386 QTC Calculation: 442 R Axis:   76 Text Interpretation:  Normal sinus rhythm Normal ECG Confirmed by Carlise Stofer   MD, Rylea Selway ([id number]) on 10/08/2014 12:13:43 AM      MDM   Final diagnoses:  Headache, unspecified headache type  Other chest pain    3:14 AM 39 y.o. female here with gradual onset headache after blood draw today. She has known chronic kidney disease and is on the transplant list. She also notes some chest heaviness earlier in the day which has since resolved. She does have a history of headaches and this headache is consistent with previous headaches although more intense. It was gradual in onset. She denies any fevers. Vital signs unremarkable here. She has a normal neurologic exam. We'll treat with headache cocktail.  3:14 AM: No previous creatinine to compare to but likely consistent with her chronic kidney disease. Headache resolving in the patient's feeling much better  and would like to go home. Low risk for MACE per HEART score. I have discussed the diagnosis/risks/treatment options with the patient and believe the pt to be eligible for discharge home to follow-up with her pcp as needed. We also discussed returning to the ED immediately if new or worsening sx occur. We discussed the sx which are most concerning (e.g., return of cp, sob, fever, worsening HA) that necessitate immediate return. Medications administered to the patient during their visit and any new prescriptions provided to the patient are listed below.  Medications given during this visit Medications  sodium chloride 0.9 % bolus 500 mL (500 mLs Intravenous New Bag/Given 10/08/14 0121)  metoCLOPramide (REGLAN) injection 5 mg (5 mg Intravenous Given 10/08/14 0119)  diphenhydrAMINE (BENADRYL) injection 25 mg (25 mg Intravenous Given 10/08/14 0120)  HYDROmorphone (DILAUDID) injection 0.5 mg (0.5 mg Intravenous Given 10/08/14 0119)  acetaminophen (TYLENOL) tablet 650 mg (650 mg Oral Given 10/08/14 0120)    New Prescriptions   No medications on file      I personally performed the services described in this documentation, which was scribed in my presence. The recorded information has been reviewed and is accurate.    Valerie Pert, MD 10/08/14 [phone]

## 2014-12-05 ENCOUNTER — Other Ambulatory Visit: Payer: Self-pay | Admitting: *Deleted

## 2014-12-05 DIAGNOSIS — Z01810 Encounter for preprocedural cardiovascular examination: Secondary | ICD-10-CM

## 2014-12-05 DIAGNOSIS — N184 Chronic kidney disease, stage 4 (severe): Secondary | ICD-10-CM

## 2015-01-13 ENCOUNTER — Encounter: Payer: Self-pay | Admitting: Vascular Surgery

## 2015-01-13 ENCOUNTER — Encounter: Payer: Self-pay | Admitting: Dietician

## 2015-01-13 ENCOUNTER — Encounter: Payer: BLUE CROSS/BLUE SHIELD | Attending: Family Medicine | Admitting: Dietician

## 2015-01-13 VITALS — Ht 67.5 in | Wt 157.0 lb

## 2015-01-13 DIAGNOSIS — Z713 Dietary counseling and surveillance: Secondary | ICD-10-CM | POA: Insufficient documentation

## 2015-01-13 DIAGNOSIS — N184 Chronic kidney disease, stage 4 (severe): Secondary | ICD-10-CM | POA: Diagnosis present

## 2015-01-13 NOTE — Progress Notes (Addendum)
  Medical Nutrition Therapy:  Appt start time: 1400 end time:  [id number].   Assessment:  Primary concerns today: Patient is here alone.  She has a hx of Stage 4 CKD and hyperparathyroidism.  She is on the transplant list through [facility].  She will be getting a fistula put in for HD access next week.   Hx also includes:  HTN since age 39.  Labs: 12/17/14:  BUN 55, Creat 6.4, Potassium 4.2.  Phos 6.3, Iron study WNL. Glucose 118 but she had a steroid injection about 1 week prior.  Her weight was 157 lbs today.  Decreased 9 lbs in the past 6 weeks and decreased about 19 lbs in the past 3 months.  Weight loss partially due to not knowing what to eat and being overly restrictive.  Poor appetite "for years".  Mainly eats only 1 meal per day and limited snacks if any.  She was 110 lbs 3 years ago and increased to 176 lbs due to unknown cause.    Patient lives with her husband.  She has a 86 yo son not in the house.  She does the shopping and cooking.  Her husband is traveling for work every week.  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  She does all of the shopping and cooking.  Her husband is very supportive.  She does not smoke.    01/20/15:  Patient called with questions regarding specific foods and phosphorus content.  Questions answered.  Preferred Learning Style:   No preference indicated   Learning Readiness:   Ready  In progress  MEDICATIONS: see list   DIETARY INTAKE: Wakes 3 am, at work at 4 am-noon.   She does not eat pork, seafood or eggs, does not eat chocolate. She stopped drinking dark soda.   Likes a lot of vegetables.  24-hr recall:  B ( AM): 1/2 shot espresso with 2 oz. soy milk or on Weekends:  Rice krispies with almond milk Snk ( AM): none L ( PM): none Snk ( PM): none D ( PM): 6 oz steak, lite butter, summer squash, fruit or sherbet Snk ( PM): none Beverages: Water, occasional sprite or gingerale, occasional espresso with soy milk   Usual physical activity: ADL's,  Dance, walks  Estimated energy needs: 2000 calories 50-60 g protein  Progress Towards Goal(s):  In progress.   Nutritional Diagnosis:  NB-1.1 Food and nutrition-related knowledge deficit As related to diet for stage 4 CKD.  As evidenced by patient report.    Intervention:  Nutrition education regarding a 2 gm sodium, low phosphorus, 50 gm protein diet.  For stage 4 CKD.  We discussed the importance of increased nutrition, ideas for packing lunch and snacks and maintaining caloric intake.  She has been on the [employer] and has found recipes appropriate for renal problems.    Continue the 2 gram sodium, low phosphorus, 50 gram protein. Be sure to get in the nutrition that you need to maintain your weight.    Teaching Method Utilized:  Visual Auditory  Handouts given during visit include:  Choose a meal book adapted to current needs  Barriers to learning/adherence to lifestyle change: none  Demonstrated degree of understanding via:  Teach Back   Monitoring/Evaluation:  Dietary intake, exercise, and body weight prn.

## 2015-01-13 NOTE — Patient Instructions (Signed)
Continue the 2 gram sodium, low phosphorus, 50 gram protein. Be sure to get in the nutrition that you need to maintain your weight.

## 2015-01-14 ENCOUNTER — Ambulatory Visit (HOSPITAL_COMMUNITY)
Admission: RE | Admit: 2015-01-14 | Discharge: 2015-01-14 | Disposition: A | Discharge status: Home / Self Care | Payer: BLUE CROSS/BLUE SHIELD | Source: Ambulatory Visit | Attending: Vascular Surgery | Admitting: Vascular Surgery

## 2015-01-14 ENCOUNTER — Ambulatory Visit (INDEPENDENT_AMBULATORY_CARE_PROVIDER_SITE_OTHER): Payer: BLUE CROSS/BLUE SHIELD | Admitting: Vascular Surgery

## 2015-01-14 ENCOUNTER — Ambulatory Visit (INDEPENDENT_AMBULATORY_CARE_PROVIDER_SITE_OTHER)
Admission: RE | Admit: 2015-01-14 | Discharge: 2015-01-14 | Disposition: A | Discharge status: Home / Self Care | Payer: BLUE CROSS/BLUE SHIELD | Source: Ambulatory Visit | Attending: Vascular Surgery | Admitting: Vascular Surgery

## 2015-01-14 ENCOUNTER — Encounter: Payer: Self-pay | Admitting: *Deleted

## 2015-01-14 ENCOUNTER — Encounter: Payer: Self-pay | Admitting: Vascular Surgery

## 2015-01-14 VITALS — BP 132/92 | HR 67 | Temp 97.7°F | Resp 14 | Ht 67.5 in | Wt 158.0 lb

## 2015-01-14 DIAGNOSIS — N184 Chronic kidney disease, stage 4 (severe): Secondary | ICD-10-CM | POA: Diagnosis not present

## 2015-01-14 DIAGNOSIS — Z01810 Encounter for preprocedural cardiovascular examination: Secondary | ICD-10-CM | POA: Insufficient documentation

## 2015-01-14 DIAGNOSIS — Z0279 Encounter for issue of other medical certificate: Secondary | ICD-10-CM

## 2015-01-14 NOTE — Progress Notes (Signed)
[facility]  Patient name: Valerie Yates MRN: [id number] DOB: [birth year] Sex: female  REASON FOR CONSULT: evaluate for hemodialysis access  HPI: Valerie Yates is a 39 y.o. female who was referred for evaluation or hemodialysis access. Her end-stage renal disease is likely secondary to hypertension and non-steroidal anti-inflammatory use for migraines. She has had some fatigue and occasional nausea. She denies any other uremic symptoms.  I have reviewed the records sent from [facility]. She has stage IV chronic kidney disease. This may be related to previous episodes of pyelonephritis or possibly chronic glomerulonephritis. In addition she has a history of hypertension and recurrent UTIs.   Past Medical History  Diagnosis Date  . Chronic kidney disease (CKD)     Stage IV  . Hypertension   . Anemia   . 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 UTI (lower urinary tract infection)     recurrent  . Hyperparathyroidism due to renal insufficiency    Family History  Problem Relation Age of Onset  . Cancer Mother     Breast   SOCIAL HISTORY: Social History  Substance Use Topics  . Smoking status: Former Smoker    Quit date: 11/24/2002  . Smokeless tobacco: Never Used  . Alcohol Use: No   Allergies  Allergen Reactions  . Penicillins Hives  . Erythromycin Rash    Acne Aid-Dermatological  . Morphine Sulfate Rash    Concentrate-Analgesics-Opioid    Current Outpatient Prescriptions  Medication Sig Dispense Refill  . ALPRAZolam (XANAX) 0.5 MG tablet Take 0.5 mg by mouth 3 (three) times daily as needed for anxiety.    . calcitRIOL (ROCALTROL) 0.25 MCG capsule Take 0.25 mcg by mouth daily.    . carvedilol (COREG) 25 MG tablet Take 25 mg by mouth 2 (two) times daily with a meal.    . cloNIDine HCl (KAPVAY) 0.1 MG TB12 ER tablet Take by mouth 3 (three) times daily.    . furosemide (LASIX) 40 MG tablet Take 80  mg by mouth 2 (two) times daily.      No current facility-administered medications for this visit.   REVIEW OF SYSTEMS: Valu.Nieves ] denotes positive finding; [  ] denotes negative finding  CARDIOVASCULAR:  [ ]  chest pain   [ ]  chest pressure   [ ]  palpitations   [ ]  orthopnea   [ ]  dyspnea on exertion   [ ]  claudication   [ ]  rest pain   [ ]  DVT   [ ]  phlebitis PULMONARY:   [ ]  productive cough   [ ]  asthma   [ ]  wheezing NEUROLOGIC:   [ ]  weakness  [ ]  paresthesias  [ ]  aphasia  [ ]  amaurosis  [ ]  dizziness HEMATOLOGIC:   [ ]  bleeding problems   [ ]  clotting disorders MUSCULOSKELETAL:  [ ]  joint pain   [ ]  joint swelling [ ]  leg swelling GASTROINTESTINAL: [ ]   blood in stool  [ ]   hematemesis GENITOURINARY:  [ ]   dysuria  [ ]   hematuria PSYCHIATRIC:  [ ]  history of major depression INTEGUMENTARY:  [ ]  rashes  [ ]  ulcers CONSTITUTIONAL:  [ ]  fever   [ ]  chills  PHYSICAL EXAM: Filed Vitals:   01/14/15 0940  BP: 132/92  Pulse: 67  Temp: 97.7 F (36.5 C)  Resp: 14  Height: 5' 7.5" (1.715 m)  Weight: 158 lb (71.668 kg)  SpO2: 100%   GENERAL: The  patient is a well-nourished female, in no acute distress. The vital signs are documented above. CARDIAC: There is a regular rate and rhythm.  VASCULAR: I do not detect carotid bruits. She has palpable radial pulses bilaterally. PULMONARY: There is good air exchange bilaterally without wheezing or rales. ABDOMEN: Soft and non-tender with normal pitched bowel sounds.  MUSCULOSKELETAL: There are no major deformities or cyanosis. NEUROLOGIC: No focal weakness or paresthesias are detected. SKIN: There are no ulcers or rashes noted. PSYCHIATRIC: The patient has a normal affect.  DATA:   HER GFR ON 10/07/2014 WAS 12  I have independently interpreted her upper extremity vein mapping which shows that on the left side her forearm cephalic vein looks reasonable in size. No upper arm cephalic vein can be identified. The basilic vein on the left looks  reasonable in size. On the right side the cephalic vein looks small in both the forearm and the upper arm. The basilic vein looks reasonable in size.  I have independently interpreted her upper extremity arterial duplex Which shows triphasic Doppler signals in the radial and ulnar positions bilaterally.  MEDICAL ISSUES:  STAGE IV CHRONIC KIDNEY DISEASE: Based on her vein map, she may be a candidate for a left radiocephalic AV fistula. If this vein is not adequate she could potentially have a left basilic vein transposition. If neither are adequate I would recommend placement of a left arm graft given that her GFR is 12. I have explained the indications for placement of an AV fistula or AV graft. I've explained that if at all possible we will place an AV fistula.  I have reviewed the risks of placement of an AV fistula including but not limited to: failure of the fistula to mature, need for subsequent interventions, and thrombosis. In addition I have reviewed the potential complications of placement of an AV graft. These risks include, but are not limited to, graft thrombosis, graft infection, wound healing problems, bleeding, arm swelling, and steal syndrome. All the patient's questions were answered and they are agreeable to proceed with surgery. Her surgery is scheduled for 01/27/2015.   [name] [facility] [place]: [phone]

## 2015-01-15 ENCOUNTER — Other Ambulatory Visit: Payer: Self-pay

## 2015-01-23 ENCOUNTER — Encounter (HOSPITAL_COMMUNITY): Payer: Self-pay

## 2015-01-23 ENCOUNTER — Encounter (HOSPITAL_COMMUNITY)
Admission: RE | Admit: 2015-01-23 | Discharge: 2015-01-23 | Disposition: A | Discharge status: Home / Self Care | Payer: BLUE CROSS/BLUE SHIELD | Source: Ambulatory Visit | Attending: Vascular Surgery | Admitting: Vascular Surgery

## 2015-01-23 DIAGNOSIS — Z01818 Encounter for other preprocedural examination: Secondary | ICD-10-CM | POA: Insufficient documentation

## 2015-01-23 DIAGNOSIS — N184 Chronic kidney disease, stage 4 (severe): Secondary | ICD-10-CM | POA: Insufficient documentation

## 2015-01-23 HISTORY — DX: Other complications of anesthesia, initial encounter: T88.59XA

## 2015-01-23 HISTORY — DX: Anxiety disorder, unspecified: F41.9

## 2015-01-23 HISTORY — DX: Headache, unspecified: R51.9

## 2015-01-23 HISTORY — DX: Headache: R51

## 2015-01-23 HISTORY — DX: Reserved for inherently not codable concepts without codable children: IMO0001

## 2015-01-23 HISTORY — DX: Adverse effect of unspecified anesthetic, initial encounter: T41.45XA

## 2015-01-23 NOTE — Pre-Procedure Instructions (Signed)
    Valerie Yates  01/23/2015      [facility] [place] [address] [place] [id number] [place]) [place] [address] Phone: [phone] Fax: [phone]    Your procedure is scheduled on 01-27-2015   Tuesday .  Report to [facility] Admitting at 12:50 PM .  Call this number if you have problems the morning of surgery:  [phone]   Remember:  Do not eat food or drink liquids after midnight.   Take these medicines the morning of surgery with A SIP OF WATER Alprazolam(xanax) if needed,carvedilol(coreg),Clonidine(Kapvay)   Do not wear jewelry, make-up or nail polish.  Do not wear lotions, powders, or perfumes.  You may not wear deodorant.  Do not shave 48 hours prior to surgery.     Do not bring valuables to the hospital.  [facility] is not responsible for any belongings or valuables.  Contacts, dentures or bridgework may not be worn into surgery.      Patients discharged the day of surgery will not be allowed to drive home.    Special instructions:  See Attached  Sheet :Preparing for Surgery" for instructions on CHG shower      Please read over the following fact sheets that you were given. Pain Booklet and Surgical Site Infection Prevention

## 2015-01-27 ENCOUNTER — Encounter (HOSPITAL_COMMUNITY): Payer: Self-pay | Admitting: *Deleted

## 2015-01-27 ENCOUNTER — Ambulatory Visit (HOSPITAL_COMMUNITY): Payer: BLUE CROSS/BLUE SHIELD | Admitting: Certified Registered"

## 2015-01-27 ENCOUNTER — Ambulatory Visit (HOSPITAL_COMMUNITY)
Admission: RE | Admit: 2015-01-27 | Discharge: 2015-01-27 | Disposition: A | Discharge status: Home / Self Care | Payer: BLUE CROSS/BLUE SHIELD | Source: Ambulatory Visit | Attending: Vascular Surgery | Admitting: Vascular Surgery

## 2015-01-27 ENCOUNTER — Other Ambulatory Visit: Payer: Self-pay | Admitting: *Deleted

## 2015-01-27 ENCOUNTER — Encounter (HOSPITAL_COMMUNITY)
Admission: RE | Disposition: A | Discharge status: Home / Self Care | Payer: Self-pay | Source: Ambulatory Visit | Attending: Vascular Surgery

## 2015-01-27 DIAGNOSIS — N2581 Secondary hyperparathyroidism of renal origin: Secondary | ICD-10-CM | POA: Insufficient documentation

## 2015-01-27 DIAGNOSIS — N184 Chronic kidney disease, stage 4 (severe): Secondary | ICD-10-CM | POA: Insufficient documentation

## 2015-01-27 DIAGNOSIS — Z87891 Personal history of nicotine dependence: Secondary | ICD-10-CM | POA: Insufficient documentation

## 2015-01-27 DIAGNOSIS — K219 Gastro-esophageal reflux disease without esophagitis: Secondary | ICD-10-CM | POA: Diagnosis not present

## 2015-01-27 DIAGNOSIS — I129 Hypertensive chronic kidney disease with stage 1 through stage 4 chronic kidney disease, or unspecified chronic kidney disease: Secondary | ICD-10-CM | POA: Diagnosis not present

## 2015-01-27 DIAGNOSIS — Z79899 Other long term (current) drug therapy: Secondary | ICD-10-CM | POA: Diagnosis not present

## 2015-01-27 DIAGNOSIS — N186 End stage renal disease: Secondary | ICD-10-CM

## 2015-01-27 DIAGNOSIS — Z4931 Encounter for adequacy testing for hemodialysis: Secondary | ICD-10-CM

## 2015-01-27 HISTORY — PX: AV FISTULA PLACEMENT: SHX1204

## 2015-01-27 LAB — POCT I-STAT 4, (NA,K, GLUC, HGB,HCT)
Glucose, Bld: 93 mg/dL (ref 65–99)
HEMATOCRIT: 38 % (ref 36.0–46.0)
HEMOGLOBIN: 12.9 g/dL (ref 12.0–15.0)
Potassium: 3.5 mmol/L (ref 3.5–5.1)
Sodium: 140 mmol/L (ref 135–145)

## 2015-01-27 SURGERY — ARTERIOVENOUS (AV) FISTULA CREATION
Anesthesia: Monitor Anesthesia Care | Laterality: Left

## 2015-01-27 MED ORDER — CHLORHEXIDINE GLUCONATE CLOTH 2 % EX PADS: 6.0000 | MEDICATED_PAD | Freq: Once | CUTANEOUS | Status: DC

## 2015-01-27 MED ORDER — PROMETHAZINE HCL 25 MG/ML IJ SOLN: 6.2500 mg | INTRAMUSCULAR | Status: DC | PRN

## 2015-01-27 MED ORDER — DIPHENHYDRAMINE HCL 25 MG PO CAPS
ORAL_CAPSULE | ORAL | Status: AC
  Filled 2015-01-27: qty 1

## 2015-01-27 MED ORDER — FENTANYL CITRATE (PF) 100 MCG/2ML IJ SOLN
INTRAMUSCULAR | Status: AC
  Administered 2015-01-27: 25 ug via INTRAVENOUS
  Filled 2015-01-27: qty 2

## 2015-01-27 MED ORDER — LIDOCAINE HCL (CARDIAC) 20 MG/ML IV SOLN
INTRAVENOUS | Status: DC | PRN
  Administered 2015-01-27: 50 mg via INTRAVENOUS

## 2015-01-27 MED ORDER — PROPOFOL 10 MG/ML IV BOLUS
INTRAVENOUS | Status: AC
  Filled 2015-01-27: qty 20

## 2015-01-27 MED ORDER — ONDANSETRON HCL 4 MG/2ML IJ SOLN
INTRAMUSCULAR | Status: AC
  Administered 2015-01-27: 8 mg via INTRAVENOUS
  Filled 2015-01-27: qty 2

## 2015-01-27 MED ORDER — THROMBIN 20000 UNITS EX SOLR
CUTANEOUS | Status: AC
  Filled 2015-01-27: qty 20000

## 2015-01-27 MED ORDER — VANCOMYCIN HCL IN DEXTROSE 1-5 GM/200ML-% IV SOLN: 1000.0000 mg | INTRAVENOUS | Status: DC

## 2015-01-27 MED ORDER — METOPROLOL TARTRATE 1 MG/ML IV SOLN
INTRAVENOUS | Status: DC | PRN
  Administered 2015-01-27 (×2): 2.5 mg via INTRAVENOUS

## 2015-01-27 MED ORDER — PROPOFOL INFUSION 10 MG/ML OPTIME
INTRAVENOUS | Status: DC | PRN
  Administered 2015-01-27: 100 ug/kg/min via INTRAVENOUS

## 2015-01-27 MED ORDER — LIDOCAINE-EPINEPHRINE (PF) 1 %-1:200000 IJ SOLN
INTRAMUSCULAR | Status: AC
  Filled 2015-01-27: qty 30

## 2015-01-27 MED ORDER — KETAMINE HCL 100 MG/ML IJ SOLN
INTRAMUSCULAR | Status: AC
  Filled 2015-01-27: qty 1

## 2015-01-27 MED ORDER — GLYCOPYRROLATE 0.2 MG/ML IJ SOLN
INTRAMUSCULAR | Status: DC | PRN
  Administered 2015-01-27: 0.2 mg via INTRAVENOUS

## 2015-01-27 MED ORDER — OXYCODONE-ACETAMINOPHEN 5-325 MG PO TABS: 1.0000 | ORAL_TABLET | Freq: Four times a day (QID) | ORAL | Status: DC | PRN

## 2015-01-27 MED ORDER — VANCOMYCIN HCL IN DEXTROSE 1-5 GM/200ML-% IV SOLN
INTRAVENOUS | Status: AC
  Administered 2015-01-27: 40 mg via INTRAVENOUS
  Filled 2015-01-27: qty 200

## 2015-01-27 MED ORDER — LIDOCAINE-EPINEPHRINE (PF) 1 %-1:200000 IJ SOLN
INTRAMUSCULAR | Status: DC | PRN
  Administered 2015-01-27: 30 mL

## 2015-01-27 MED ORDER — LIDOCAINE HCL (PF) 1 % IJ SOLN
INTRAMUSCULAR | Status: DC | PRN
  Administered 2015-01-27: 30 mL

## 2015-01-27 MED ORDER — HEPARIN SODIUM (PORCINE) 1000 UNIT/ML IJ SOLN
INTRAMUSCULAR | Status: DC | PRN
  Administered 2015-01-27: 6000 [IU] via INTRAVENOUS

## 2015-01-27 MED ORDER — ONDANSETRON HCL 4 MG/2ML IJ SOLN
INTRAMUSCULAR | Status: AC
  Filled 2015-01-27: qty 2

## 2015-01-27 MED ORDER — KETAMINE HCL 10 MG/ML IJ SOLN
INTRAMUSCULAR | Status: DC | PRN
  Administered 2015-01-27 (×2): 10 mg via INTRAVENOUS
  Administered 2015-01-27: 20 mg via INTRAVENOUS
  Administered 2015-01-27: 30 mg via INTRAVENOUS
  Administered 2015-01-27: 10 mg via INTRAVENOUS
  Administered 2015-01-27: 20 mg via INTRAVENOUS

## 2015-01-27 MED ORDER — PROTAMINE SULFATE 10 MG/ML IV SOLN
INTRAVENOUS | Status: DC | PRN
  Administered 2015-01-27: 30 mg via INTRAVENOUS

## 2015-01-27 MED ORDER — MEPERIDINE HCL 25 MG/ML IJ SOLN: 6.2500 mg | INTRAMUSCULAR | Status: DC | PRN

## 2015-01-27 MED ORDER — MIDAZOLAM HCL 2 MG/2ML IJ SOLN: 0.5000 mg | Freq: Once | INTRAMUSCULAR | Status: DC | PRN

## 2015-01-27 MED ORDER — ONDANSETRON HCL 4 MG/2ML IJ SOLN
4.0000 mg | Freq: Once | INTRAMUSCULAR | Status: AC
  Administered 2015-01-27: 4 mg via INTRAVENOUS
  Filled 2015-01-27: qty 2

## 2015-01-27 MED ORDER — PAPAVERINE HCL 30 MG/ML IJ SOLN
INTRAMUSCULAR | Status: DC | PRN
  Administered 2015-01-27: 2 mL via INTRAVENOUS

## 2015-01-27 MED ORDER — PAPAVERINE HCL 30 MG/ML IJ SOLN
INTRAMUSCULAR | Status: AC
  Filled 2015-01-27: qty 2

## 2015-01-27 MED ORDER — LIDOCAINE HCL (PF) 1 % IJ SOLN
INTRAMUSCULAR | Status: AC
  Filled 2015-01-27: qty 30

## 2015-01-27 MED ORDER — SODIUM CHLORIDE 0.9 % IV SOLN
INTRAVENOUS | Status: DC
  Administered 2015-01-27 (×2): via INTRAVENOUS

## 2015-01-27 MED ORDER — FENTANYL CITRATE (PF) 100 MCG/2ML IJ SOLN
25.0000 ug | INTRAMUSCULAR | Status: DC | PRN
  Administered 2015-01-27 (×2): 25 ug via INTRAVENOUS

## 2015-01-27 MED ORDER — PROPOFOL 10 MG/ML IV BOLUS
INTRAVENOUS | Status: DC | PRN
  Administered 2015-01-27: 10 mg via INTRAVENOUS
  Administered 2015-01-27: 50 mg via INTRAVENOUS
  Administered 2015-01-27: 10 mg via INTRAVENOUS
  Administered 2015-01-27: 100 mg via INTRAVENOUS

## 2015-01-27 MED ORDER — 0.9 % SODIUM CHLORIDE (POUR BTL) OPTIME
TOPICAL | Status: DC | PRN
  Administered 2015-01-27: 1000 mL

## 2015-01-27 MED ORDER — SODIUM CHLORIDE 0.9 % IV SOLN
INTRAVENOUS | Status: DC | PRN
  Administered 2015-01-27: 500 mL

## 2015-01-27 MED ORDER — DEXAMETHASONE SODIUM PHOSPHATE 10 MG/ML IJ SOLN
INTRAMUSCULAR | Status: AC
  Administered 2015-01-27: 10 mg via INTRAVENOUS
  Filled 2015-01-27: qty 1

## 2015-01-27 SURGICAL SUPPLY — 32 items
ARMBAND PINK RESTRICT EXTREMIT (MISCELLANEOUS) ×2 IMPLANT
CANISTER SUCTION 2500CC (MISCELLANEOUS) ×2 IMPLANT
CANNULA VESSEL 3MM 2 BLNT TIP (CANNULA) ×4 IMPLANT
CLIP TI MEDIUM 6 (CLIP) ×2 IMPLANT
CLIP TI WIDE RED SMALL 6 (CLIP) ×6 IMPLANT
COVER PROBE W GEL 5X96 (DRAPES) IMPLANT
DECANTER SPIKE VIAL GLASS SM (MISCELLANEOUS) ×2 IMPLANT
ELECT REM PT RETURN 9FT ADLT (ELECTROSURGICAL) ×2
ELECTRODE REM PT RTRN 9FT ADLT (ELECTROSURGICAL) ×1 IMPLANT
GLOVE BIO SURGEON STRL SZ7.5 (GLOVE) ×2 IMPLANT
GLOVE BIOGEL PI IND STRL 6.5 (GLOVE) ×2 IMPLANT
GLOVE BIOGEL PI IND STRL 8 (GLOVE) ×1 IMPLANT
GLOVE BIOGEL PI IND STRL 8.5 (GLOVE) ×1 IMPLANT
GLOVE BIOGEL PI INDICATOR 6.5 (GLOVE) ×2
GLOVE BIOGEL PI INDICATOR 8 (GLOVE) ×1
GLOVE BIOGEL PI INDICATOR 8.5 (GLOVE) ×1
GOWN STRL REUS W/ TWL LRG LVL3 (GOWN DISPOSABLE) ×3 IMPLANT
GOWN STRL REUS W/TWL LRG LVL3 (GOWN DISPOSABLE) ×3
KIT BASIN OR (CUSTOM PROCEDURE TRAY) ×2 IMPLANT
KIT ROOM TURNOVER OR (KITS) ×2 IMPLANT
LIQUID BAND (GAUZE/BANDAGES/DRESSINGS) ×2 IMPLANT
NS IRRIG 1000ML POUR BTL (IV SOLUTION) ×2 IMPLANT
PACK CV ACCESS (CUSTOM PROCEDURE TRAY) ×2 IMPLANT
PAD ARMBOARD 7.5X6 YLW CONV (MISCELLANEOUS) ×4 IMPLANT
SUT PROLENE 6 0 BV (SUTURE) ×4 IMPLANT
SUT SILK 2 0 SH (SUTURE) ×2 IMPLANT
SUT VIC AB 3-0 SH 27 (SUTURE) ×4
SUT VIC AB 3-0 SH 27X BRD (SUTURE) ×4 IMPLANT
SUT VICRYL 4-0 PS2 18IN ABS (SUTURE) ×6 IMPLANT
SYR 30ML LL (SYRINGE) ×2 IMPLANT
UNDERPAD 30X30 INCONTINENT (UNDERPADS AND DIAPERS) ×2 IMPLANT
WATER STERILE IRR 1000ML POUR (IV SOLUTION) ×2 IMPLANT

## 2015-01-27 NOTE — Progress Notes (Signed)
C/o nausea Dr Glennon Mac called and new orders noted. Pt also reports that she has not been taking her coreg because it drops her bp. She has not taken it in a week. Dr Glennon Mac informed and states to hold coreg.

## 2015-01-27 NOTE — OR Nursing (Signed)
Late entry for delay code documentation. 

## 2015-01-27 NOTE — H&P (View-Only) (Signed)
Vascular and Vein Specialist of [facility]  Patient name: Valerie Yates MRN: [id number] DOB: [birth year] Sex: female  REASON FOR CONSULT: evaluate for hemodialysis access  HPI: Valerie Yates is a 39 y.o. female who was referred for evaluation or hemodialysis access. Her end-stage renal disease is likely secondary to hypertension and non-steroidal anti-inflammatory use for migraines. She has had some fatigue and occasional nausea. She denies any other uremic symptoms.  I have reviewed the records sent from [facility]. She has stage IV chronic kidney disease. This may be related to previous episodes of pyelonephritis or possibly chronic glomerulonephritis. In addition she has a history of hypertension and recurrent UTIs.   Past Medical History  Diagnosis Date  . Chronic kidney disease (CKD)     Stage IV  . Hypertension   . Anemia   . 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 UTI (lower urinary tract infection)     recurrent  . Hyperparathyroidism due to renal insufficiency    Family History  Problem Relation Age of Onset  . Cancer Mother     Breast   SOCIAL HISTORY: Social History  Substance Use Topics  . Smoking status: Former Smoker    Quit date: 11/24/2002  . Smokeless tobacco: Never Used  . Alcohol Use: No   Allergies  Allergen Reactions  . Penicillins Hives  . Erythromycin Rash    Acne Aid-Dermatological  . Morphine Sulfate Rash    Concentrate-Analgesics-Opioid    Current Outpatient Prescriptions  Medication Sig Dispense Refill  . ALPRAZolam (XANAX) 0.5 MG tablet Take 0.5 mg by mouth 3 (three) times daily as needed for anxiety.    . calcitRIOL (ROCALTROL) 0.25 MCG capsule Take 0.25 mcg by mouth daily.    . carvedilol (COREG) 25 MG tablet Take 25 mg by mouth 2 (two) times daily with a meal.    . cloNIDine HCl (KAPVAY) 0.1 MG TB12 ER tablet Take by mouth 3 (three) times daily.    . furosemide (LASIX) 40 MG tablet Take 80  mg by mouth 2 (two) times daily.      No current facility-administered medications for this visit.   REVIEW OF SYSTEMS: Valu.Nieves ] denotes positive finding; [  ] denotes negative finding  CARDIOVASCULAR:  [ ]  chest pain   [ ]  chest pressure   [ ]  palpitations   [ ]  orthopnea   [ ]  dyspnea on exertion   [ ]  claudication   [ ]  rest pain   [ ]  DVT   [ ]  phlebitis PULMONARY:   [ ]  productive cough   [ ]  asthma   [ ]  wheezing NEUROLOGIC:   [ ]  weakness  [ ]  paresthesias  [ ]  aphasia  [ ]  amaurosis  [ ]  dizziness HEMATOLOGIC:   [ ]  bleeding problems   [ ]  clotting disorders MUSCULOSKELETAL:  [ ]  joint pain   [ ]  joint swelling [ ]  leg swelling GASTROINTESTINAL: [ ]   blood in stool  [ ]   hematemesis GENITOURINARY:  [ ]   dysuria  [ ]   hematuria PSYCHIATRIC:  [ ]  history of major depression INTEGUMENTARY:  [ ]  rashes  [ ]  ulcers CONSTITUTIONAL:  [ ]  fever   [ ]  chills  PHYSICAL EXAM: Filed Vitals:   01/14/15 0940  BP: 132/92  Pulse: 67  Temp: 97.7 F (36.5 C)  Resp: 14  Height: 5' 7.5" (1.715 m)  Weight: 158 lb (71.668 kg)  SpO2: 100%   GENERAL: The  patient is a well-nourished female, in no acute distress. The vital signs are documented above. CARDIAC: There is a regular rate and rhythm.  VASCULAR: I do not detect carotid bruits. She has palpable radial pulses bilaterally. PULMONARY: There is good air exchange bilaterally without wheezing or rales. ABDOMEN: Soft and non-tender with normal pitched bowel sounds.  MUSCULOSKELETAL: There are no major deformities or cyanosis. NEUROLOGIC: No focal weakness or paresthesias are detected. SKIN: There are no ulcers or rashes noted. PSYCHIATRIC: The patient has a normal affect.  DATA:   HER GFR ON 10/07/2014 WAS 12  I have independently interpreted her upper extremity vein mapping which shows that on the left side her forearm cephalic vein looks reasonable in size. No upper arm cephalic vein can be identified. The basilic vein on the left looks  reasonable in size. On the right side the cephalic vein looks small in both the forearm and the upper arm. The basilic vein looks reasonable in size.  I have independently interpreted her upper extremity arterial duplex Which shows triphasic Doppler signals in the radial and ulnar positions bilaterally.  MEDICAL ISSUES:  STAGE IV CHRONIC KIDNEY DISEASE: Based on her vein map, she may be a candidate for a left radiocephalic AV fistula. If this vein is not adequate she could potentially have a left basilic vein transposition. If neither are adequate I would recommend placement of a left arm graft given that her GFR is 12. I have explained the indications for placement of an AV fistula or AV graft. I've explained that if at all possible we will place an AV fistula.  I have reviewed the risks of placement of an AV fistula including but not limited to: failure of the fistula to mature, need for subsequent interventions, and thrombosis. In addition I have reviewed the potential complications of placement of an AV graft. These risks include, but are not limited to, graft thrombosis, graft infection, wound healing problems, bleeding, arm swelling, and steal syndrome. All the patient's questions were answered and they are agreeable to proceed with surgery. Her surgery is scheduled for 01/27/2015.   [name] [facility] Specialists of [place]: [phone]

## 2015-01-27 NOTE — Progress Notes (Signed)
Pt had report of some stinging in her eyes, rinsed with normal saline, reports this helped a lot, no report of blurry vision

## 2015-01-27 NOTE — Anesthesia Postprocedure Evaluation (Signed)
  Anesthesia Post-op Note  Patient: Valerie Yates  Procedure(s) Performed: Procedure(s):  BASILIC VEIN TRANSPOSITION  (Left)  Patient Location: PACU  Anesthesia Type:MAC  Level of Consciousness: awake, alert , oriented and patient cooperative  Airway and Oxygen Therapy: Patient Spontanous Breathing  Post-op Pain: none  Post-op Assessment: Post-op Vital signs reviewed, Patient's Cardiovascular Status Stable, Respiratory Function Stable, Patent Airway, No signs of Nausea or vomiting and Pain level controlled              Post-op Vital Signs: Reviewed and stable  Last Vitals:  Filed Vitals:   01/27/15 1745  BP:   Pulse: 88  Temp:   Resp: 15    Complications: No apparent anesthesia complications

## 2015-01-27 NOTE — Op Note (Signed)
    NAME: [name]    MRN: [id number] DOB: 02/21/1976    DATE OF OPERATION: 01/27/2015  PREOP DIAGNOSIS: stage IV chronic kidney disease  POSTOP DIAGNOSIS: same  PROCEDURE: left basilic vein transposition  SURGEON: Judeth Cornfield. Scot Dock, MD, FACS  ASSIST: Gerri Lins PA  ANESTHESIA: local with sedation   EBL: minimal  INDICATIONS: Valerie Yates is a 39 y.o. female who is not yet on dialysis. She presents for new access.  FINDINGS: The only option for a fistula was a basilic vein transposition. The basilic vein was reasonable in size proximally 4 mm.  The brachial artery was small and also spasmed.  TECHNIQUE: The patient was taken to the operating room and sedated by anesthesia. The left upper extremity was prepped and draped in usual sterile fashion. After the skin was anesthetized with 1% lidocaine  An incision was made over the basilic vein just above the antecubital level and here the vein was dissected free. Branches were divided between clips and 3-0 silk ties. Using 2 additional incisions along the medial aspect of the left upper arm the basilic vein was harvested from the antecubital level to the axilla. At this level it joined a confluence of veins. Through the distal incision, the brachial artery was dissected free beneath the fascia. This was an approximate 3 mm artery and with dissection there was some spasm. A tunnel was then created from the distal incision to the axillary incision and the vein had been removed from its bed after being ligated distally. It was marked redundant twisting. It was brought to the tunnel and the patient was heparinized. The brachial artery was clamped proximally and distally and a longitudinal arteriotomy was made. The vein was sewn into side to the artery using continuous 60 proline suture. At the completion was a palpable thrill in the fistula. There was a weak radial and weak ulnar signal with the Doppler. Hemostasis was obtained and the wounds.  Each of the wounds was closed with deep 3-0 Vicryl and skin closed with 4-0 Vicryl. Liquiband was applied. The patient tolerated the procedure well transferred to the recovery room in stable condition. All needle and sponge counts were correct.  [name], MD, FACS [facility] of [facility]  DATE OF DICTATION:   01/27/2015

## 2015-01-27 NOTE — Anesthesia Preprocedure Evaluation (Addendum)
Anesthesia Evaluation  Patient identified by MRN, date of birth, ID band Patient awake    Reviewed: Allergy & Precautions, NPO status , Patient's Chart, lab work & pertinent test results  History of Anesthesia Complications Negative for: history of anesthetic complications  Airway Mallampati: II  TM Distance: >3 FB Neck ROM: Full    Dental  (+) Dental Advisory Given   Pulmonary shortness of breath, former smoker (quit '04),    breath sounds clear to auscultation       Cardiovascular hypertension (off of carvedilol for a few weeks), Pt. on medications (-) angina Rhythm:Regular Rate:Normal     Neuro/Psych  Headaches,    GI/Hepatic Neg liver ROS, GERD  Controlled,  Endo/Other  negative endocrine ROS  Renal/GU Renal disease (no dialysis yet, K+ 3.5)     Musculoskeletal   Abdominal   Peds  Hematology negative hematology ROS (+)   Anesthesia Other Findings   Reproductive/Obstetrics                           Anesthesia Physical Anesthesia Plan  ASA: III  Anesthesia Plan: MAC   Post-op Pain Management:    Induction:   Airway Management Planned: Simple Face Mask and Natural Airway  Additional Equipment:   Intra-op Plan:   Post-operative Plan:   Informed Consent: I have reviewed the patients History and Physical, chart, labs and discussed the procedure including the risks, benefits and alternatives for the proposed anesthesia with the patient or authorized representative who has indicated his/her understanding and acceptance.   Dental advisory given  Plan Discussed with: CRNA and Surgeon  Anesthesia Plan Comments: (Plan routine monitors, MAC)        Anesthesia Quick Evaluation

## 2015-01-27 NOTE — Interval H&P Note (Signed)
History and Physical Interval Note:  01/27/2015 2:39 PM  Valerie Yates  has presented today for surgery, with the diagnosis of Stage IV Chronic Kidney Disease N18.4  The various methods of treatment have been discussed with the patient and family. After consideration of risks, benefits and other options for treatment, the patient has consented to  Procedure(s): RADIOCEPHALIC ARTERIOVENOUS (AV) FISTULA CREATION VERSUS BASILIC VEIN TRANSPOSITION VERSUS ARTERIOVENOUS GRAFT INSERTION (Left) as a surgical intervention .  The patient's history has been reviewed, patient examined, no change in status, stable for surgery.  I have reviewed the patient's chart and labs.  Questions were answered to the patient's satisfaction.     [name]

## 2015-01-27 NOTE — Transfer of Care (Signed)
Immediate Anesthesia Transfer of Care Note  Patient: Valerie Yates  Procedure(s) Performed: Procedure(s):  BASILIC VEIN TRANSPOSITION  (Left)  Patient Location: PACU  Anesthesia Type:MAC  Level of Consciousness: awake, alert  and oriented  Airway & Oxygen Therapy: Patient Spontanous Breathing  Post-op Assessment: Report given to RN  Post vital signs: Reviewed and stable  Last Vitals:  Filed Vitals:   01/27/15 1328  BP: 123/62  Pulse: 77  Temp: 36.9 C  Resp: 18    Complications: No apparent anesthesia complications

## 2015-01-28 ENCOUNTER — Telehealth: Payer: Self-pay | Admitting: Vascular Surgery

## 2015-01-28 ENCOUNTER — Encounter (HOSPITAL_COMMUNITY): Payer: Self-pay | Admitting: Vascular Surgery

## 2015-01-28 NOTE — Telephone Encounter (Signed)
-----   Message from Mena Goes, RN sent at 01/27/2015  5:27 PM EDT ----- Regarding: schedule   ----- Message -----    From: Ulyses Amor, PA-C    Sent: 01/27/2015   5:12 PM      To: Vvs Charge Pool  F/U in 6 weeks with Dr. Scot Dock s/p Brachial basilic AV fistula transposition

## 2015-01-28 NOTE — Telephone Encounter (Signed)
Unable to reach pt by phone, no voicemail. Letter sent to home address, dpm

## 2015-03-05 ENCOUNTER — Telehealth: Payer: Self-pay

## 2015-03-05 NOTE — Telephone Encounter (Signed)
Pt. called to report increased pain in left elbow area; described an intermittent "shooting pain from elbow to top incision."  Also c/o intermittent numbness in left middle, ring, and little finger and hand, especially if her left arm is bent for any length of time.  Stated the numbness improves after she straightens out the arm, but reported she has had difficulty straightening out the left arm, and also c/o intermittent weakness of (L) hand, when trying to pick up a glass.  Also reported a small piece of white string that has shown through the incision at the antecubital area.  Reported the antecubital area is painful, if the string is touched.  Verb. Being very worried about something going wrong.  Does state there is still a thrill in the fistula site.  Stated it is the strongest at the antecubital area.  Appt. given @ 3:15 PM, 10/28, with the nurse practitioner.  Agrees with plan.

## 2015-03-06 ENCOUNTER — Encounter: Payer: Self-pay | Admitting: Family

## 2015-03-06 ENCOUNTER — Ambulatory Visit (INDEPENDENT_AMBULATORY_CARE_PROVIDER_SITE_OTHER): Payer: BLUE CROSS/BLUE SHIELD | Admitting: Family

## 2015-03-06 ENCOUNTER — Ambulatory Visit (HOSPITAL_COMMUNITY)
Admission: RE | Admit: 2015-03-06 | Discharge: 2015-03-06 | Disposition: A | Discharge status: Home / Self Care | Payer: BLUE CROSS/BLUE SHIELD | Source: Ambulatory Visit | Attending: Family | Admitting: Family

## 2015-03-06 VITALS — BP 118/79 | HR 91 | Temp 97.6°F | Resp 16 | Ht 67.0 in | Wt 150.0 lb

## 2015-03-06 DIAGNOSIS — R202 Paresthesia of skin: Secondary | ICD-10-CM

## 2015-03-06 DIAGNOSIS — I770 Arteriovenous fistula, acquired: Secondary | ICD-10-CM

## 2015-03-06 DIAGNOSIS — N184 Chronic kidney disease, stage 4 (severe): Secondary | ICD-10-CM

## 2015-03-06 MED ORDER — OXYCODONE-ACETAMINOPHEN 5-325 MG PO TABS
1.0000 | ORAL_TABLET | Freq: Four times a day (QID) | ORAL | Status: DC | PRN
Start: 2015-03-06 — End: 2015-09-14

## 2015-03-06 NOTE — Progress Notes (Addendum)
    Postoperative Access Visit   History of Present Illness  Valerie Yates is a 39 y.o. year old female patient of Dr. Scot Dock who is s/p left basilic vein transposition on 01/27/2015 for creation of an AV fistula  to use for HD. She is is stage 4 CKD. 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 In addition she has a history of hypertension and recurrent UTIs.  She has had some fatigue and occasional nausea. She denies any other uremic symptoms.  The patient is able to complete their activities of daily living.    She returns today with c/o increased pain in left elbow area; described an intermittent "shooting pain from elbow to top incision."  This pain is 9/10.  She reports taking 1 gram of acetaminophen every 6-8 hours for this pain. The oxycodone/APAP that she was prescribed postoperatively addressed her pain adequately but she has none left. Also c/o intermittent numbness in left middle, ring, and little finger and hand, especially if her left arm is bent for any length of time. Stated the numbness improves after she straightens out the arm, but reported she has had difficulty straightening out the left arm, and also c/o intermittent weakness of (L) hand, when trying to pick up a glass.  Her left hand does not feel cool nor painful. She is right hand dominant. Her left arm incision are healed. She denies fever or chills.  Pt reports that Dr. Clover Mealy told her that as soon as the AV fistula is ready, that she needs to be dialyzed.   For VQI Use Only  PRE-ADM LIVING: Home  AMB STATUS: Ambulatory  Physical Examination Filed Vitals:   03/06/15 1501  BP: 118/79  Pulse: 91  Temp: 97.6 F (36.4 C)  TempSrc: Oral  Resp: 16  Height: 5\' 7"  (1.702 m)  Weight: 150 lb (68.04 kg)  SpO2: 100%   Body mass index is 23.49 kg/(m^2).  Left UE: Incisions are well healed,  left hand/arm skin feels warm, left  hand grip is 3/5, sensation in left digits is intact, palpable thrill at left AVF. Left radial pulse is 1+ palpable.   Medical Decision Making  Valerie Yates is a 39 y.o. year old female who presents s/p  left basilic vein transposition on 01/27/2015 for creation of an AV fistula  to use for HD. She is is stage 4.  Dr. Bridgett Larsson spoke with and examined pt. Oxycodone/APAP 5/325 , one tab po every 6 hours prn pain, disp #20, 0 refills. Surgical site pain vs steal symptoms; will add steal study to already scheduled testing for dialysis fistula duplex and evaluation by Dr. Scot Dock. Our non invasive vascular lab was unable to perform a steal study late this Friday afternoon; pt advised that if her left arm pain worsens despite prescribed analgesic use, or if her left hand becomes cool or more numb or dusky, that she go to the nearest ED and be evaluated. Follow up with Dr. Scot Dock as scheduled 03/10/13 with dialysis fistula duplex, will add steal study.    NICKEL, Sharmon Leyden, RN, MSN, FNP-C [facility] [place] Office: [phone]  03/06/2015, 3:23 PM  Clinic MD: Bridgett Larsson

## 2015-03-06 NOTE — Patient Instructions (Signed)
AV Fistula, Care After Refer to this sheet in the next few weeks. These instructions provide you with information on caring for yourself after your procedure. Your caregiver may also give you more specific instructions. Your treatment has been planned according to current medical practices, but problems sometimes occur. Call your caregiver if you have any problems or questions after your procedure. HOME CARE INSTRUCTIONS   Do not drive a car or take public transportation alone.  Do not drink alcohol.  Only take medicine that has been prescribed by your caregiver.  Do not sign important papers or make important decisions.  Have a responsible person with you.  Ask your caregiver to show you how to check your access at home for a vibration (called a "thrill") or for a sound (called a "bruit" pronounced brew-ee).  Your vein will need time to enlarge and mature so needles can be inserted for dialysis. Follow your caregiver's instructions about what you need to do to make this happen.  Keep dressings clean and dry.  Keep the arm elevated above your heart. Use a pillow.  Rest.  Use the arm as usual for all activities.  Have the stitches or tape closures removed in 10 to 14 days, or as directed by your caregiver.  Do not sleep or lie on the area of the fistula or that arm. This may decrease or stop the blood flow through your fistula.  Do not allow blood pressures to be taken on this arm.  Do not allow blood drawing to be done from the graft.  Do not wear tight clothing around the access site or on the arm.  Avoid lifting heavy objects with the arm that has the fistula.  Do not use creams or lotions over the access site. SEEK MEDICAL CARE IF:   You have a fever.  You have swelling around the fistula that gets worse, or you have new pain.  You have unusual bleeding at the fistula site or from any other area.  You have pus or other drainage at the fistula site.  You have skin  redness or red streaking on the skin around, above, or below the fistula site.  Your access site feels warm.  You have any flu-like symptoms. SEEK IMMEDIATE MEDICAL CARE IF:   You have pain, numbness, or an unusual pale skin on the hand or on the side of your fistula.  You have dizziness or weakness that you have not had before.  You have shortness of breath.  You have chest pain.  Your fistula disconnects or breaks, and there is bleeding that cannot be easily controlled. Call for local emergency medical help. Do not try to drive yourself to the hospital. MAKE SURE YOU  Understand these instructions.  Will watch your condition.  Will get help right away if you are not doing well or get worse.   This information is not intended to replace advice given to you by your health care provider. Make sure you discuss any questions you have with your health care provider.   Document Released: 04/25/2005 Document Revised: 05/16/2014 Document Reviewed: 10/13/2010 Elsevier Interactive Patient Education 2016 [employer]  

## 2015-03-09 ENCOUNTER — Ambulatory Visit (HOSPITAL_COMMUNITY)
Admission: RE | Admit: 2015-03-09 | Discharge: 2015-03-09 | Disposition: A | Discharge status: Home / Self Care | Payer: BLUE CROSS/BLUE SHIELD | Source: Ambulatory Visit | Attending: Vascular Surgery | Admitting: Vascular Surgery

## 2015-03-09 ENCOUNTER — Encounter: Payer: Self-pay | Admitting: Vascular Surgery

## 2015-03-09 DIAGNOSIS — I120 Hypertensive chronic kidney disease with stage 5 chronic kidney disease or end stage renal disease: Secondary | ICD-10-CM | POA: Diagnosis not present

## 2015-03-09 DIAGNOSIS — R938 Abnormal findings on diagnostic imaging of other specified body structures: Secondary | ICD-10-CM | POA: Insufficient documentation

## 2015-03-09 DIAGNOSIS — N186 End stage renal disease: Secondary | ICD-10-CM | POA: Diagnosis not present

## 2015-03-09 DIAGNOSIS — Z4931 Encounter for adequacy testing for hemodialysis: Secondary | ICD-10-CM | POA: Insufficient documentation

## 2015-03-11 ENCOUNTER — Encounter (HOSPITAL_COMMUNITY): Payer: BLUE CROSS/BLUE SHIELD

## 2015-03-11 ENCOUNTER — Encounter: Payer: Self-pay | Admitting: *Deleted

## 2015-03-11 ENCOUNTER — Encounter: Payer: Self-pay | Admitting: Vascular Surgery

## 2015-03-11 ENCOUNTER — Ambulatory Visit (INDEPENDENT_AMBULATORY_CARE_PROVIDER_SITE_OTHER): Payer: BLUE CROSS/BLUE SHIELD | Admitting: Vascular Surgery

## 2015-03-11 VITALS — BP 101/77 | HR 80 | Ht 67.0 in | Wt 151.3 lb

## 2015-03-11 DIAGNOSIS — N184 Chronic kidney disease, stage 4 (severe): Secondary | ICD-10-CM

## 2015-03-11 NOTE — Progress Notes (Signed)
   Patient name: Valerie Yates MRN: [id number] DOB: 1975-06-10 Sex: female  REASON FOR VISIT: Follow up after left basilic vein transposition  HPI: Valerie Yates is a 39 y.o. female who underwent a left basilic vein transposition on 01/27/2015. She was seen on 03/06/2015 in our office by the nurse practitioner. She had some steal symptoms and comes in for a follow up visit. She experiences some paresthesias in her hand after sleeping at night. There is some pain associated with the incision or upper arm but no significant hand pain. She is not yet on dialysis.  Current Outpatient Prescriptions  Medication Sig Dispense Refill  . acetaminophen (TYLENOL) 500 MG tablet Take 1,000 mg by mouth daily as needed for mild pain.    Marland Kitchen ALPRAZolam (XANAX) 0.5 MG tablet Take 0.5 mg by mouth 3 (three) times daily as needed for anxiety.    . calcitRIOL (ROCALTROL) 0.25 MCG capsule Take 0.25 mcg by mouth daily.    . carvedilol (COREG) 25 MG tablet Take 25 mg by mouth 2 (two) times daily with a meal.    . cloNIDine HCl (KAPVAY) 0.1 MG TB12 ER tablet Take 0.1 mg by mouth 2 (two) times daily.     . furosemide (LASIX) 40 MG tablet Take 80 mg by mouth 2 (two) times daily.     . multivitamin (RENA-VIT) TABS tablet Take 1 tablet by mouth daily.    Marland Kitchen oxyCODONE-acetaminophen (PERCOCET/ROXICET) 5-325 MG tablet Take 1 tablet by mouth every 6 (six) hours as needed. 20 tablet 0  . sucroferric oxyhydroxide (VELPHORO) 500 MG chewable tablet Chew 500 mg by mouth 3 (three) times daily with meals.     No current facility-administered medications for this visit.    REVIEW OF SYSTEMS:  [X]  denotes positive finding, [ ]  denotes negative finding Cardiac  Comments:  Chest pain or chest pressure:    Shortness of breath upon exertion:    Short of breath when lying flat:    Irregular heart rhythm:    Constitutional    Fever or chills:      PHYSICAL EXAM: Filed Vitals:   03/11/15 1233  BP: 101/77  Pulse: 80  Height: 5\' 7"  (1.702  m)  Weight: 151 lb 4.8 oz (68.629 kg)  SpO2: 100%    GENERAL: The patient is a well-nourished female, in no acute distress. The vital signs are documented above. CARDIOVASCULAR: There is a regular rate and rhythm. PULMONARY: There is good air exchange bilaterally without wheezing or rales. She has a palpable left radial pulse. She has an excellent thrill in her fistula. Her hand appears adequately perfused.  I reviewed her duplex scan from 03/09/15. Diameters of the fistula range from 0.78-1.1 cm. There was some elevated velocities in the left distal brachial level that may be related to a valve leaflet.  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 We have discussed the option of fistulae ligation versus banding. Currently her symptoms are tolerable and we will try to ride this out. I plan on seeing her back in 3 weeks. She knows to call sooner she has problems. I've encouraged her to continue to exercise her hand and not to elevate her arm.  [name] [facility] of [place]: [phone]

## 2015-03-11 NOTE — Progress Notes (Addendum)
Per Dr. Scot Dock, Patient may return to work on 03-17-15 with no restrictions.

## 2015-04-06 ENCOUNTER — Encounter: Payer: Self-pay | Admitting: Vascular Surgery

## 2015-04-08 ENCOUNTER — Encounter: Payer: Self-pay | Admitting: Vascular Surgery

## 2015-04-08 ENCOUNTER — Ambulatory Visit (INDEPENDENT_AMBULATORY_CARE_PROVIDER_SITE_OTHER): Payer: BLUE CROSS/BLUE SHIELD | Admitting: Vascular Surgery

## 2015-04-08 VITALS — BP 140/97 | HR 83 | Ht 67.0 in | Wt 148.6 lb

## 2015-04-08 DIAGNOSIS — N184 Chronic kidney disease, stage 4 (severe): Secondary | ICD-10-CM

## 2015-04-08 NOTE — Progress Notes (Signed)
Patient name: Valerie Yates MRN: [id number] DOB: 1975-08-17 Sex: female  REASON FOR VISIT: Follow up of left basilic vein transposition  HPI: Valerie Yates is a 39 y.o. female who underwent a left basilic vein transposition on 01/27/2015. I last saw her on 11-16. She had been experiencing some paresthesias in her hand after sleeping at night and also some pain at the incision but no hand pain. She is not yet on dialysis. I reviewed her duplex scan from 03/09/2015. The diameters of the fistula looked very reasonable. There were some elevated velocities at the distal brachial level possibly related to a valve leaflet. I brought her back for a 3 week follow up visit given her steal symptoms.  Her steal symptoms have resolved. She is hopefully to begin dialysis in. She has had some nausea.  Current Outpatient Prescriptions  Medication Sig Dispense Refill  . acetaminophen (TYLENOL) 500 MG tablet Take 1,000 mg by mouth daily as needed for mild pain.    Marland Kitchen ALPRAZolam (XANAX) 0.5 MG tablet Take 0.5 mg by mouth 3 (three) times daily as needed for anxiety.    . B Complex-C-Folic Acid (RENA-VITE RX) 1 MG TABS   1  . calcitRIOL (ROCALTROL) 0.25 MCG capsule Take by mouth daily. 1/2 tablet daily    . carvedilol (COREG) 25 MG tablet Take 25 mg by mouth 2 (two) times daily with a meal.    . cloNIDine HCl (KAPVAY) 0.1 MG TB12 ER tablet Take 0.1 mg by mouth 2 (two) times daily.     . furosemide (LASIX) 40 MG tablet Take 80 mg by mouth 2 (two) times daily.     . multivitamin (RENA-VIT) TABS tablet Take 1 tablet by mouth daily.    Marland Kitchen oxyCODONE-acetaminophen (PERCOCET/ROXICET) 5-325 MG tablet Take 1 tablet by mouth every 6 (six) hours as needed. 20 tablet 0  . sucroferric oxyhydroxide (VELPHORO) 500 MG chewable tablet Chew 500 mg by mouth 3 (three) times daily with meals.     No current facility-administered medications for this visit.    REVIEW OF SYSTEMS:  [X]  denotes positive finding, [ ]  denotes negative  finding Cardiac  Comments:  Chest pain or chest pressure:    Shortness of breath upon exertion:    Short of breath when lying flat:    Irregular heart rhythm:    Constitutional    Fever or chills:      PHYSICAL EXAM: Filed Vitals:   04/08/15 0845  BP: 140/97  Pulse: 83  Height: 5\' 7"  (1.702 m)  Weight: 148 lb 9.6 oz (67.405 kg)  SpO2: 100%    GENERAL: The patient is a well-nourished female, in no acute distress. The vital signs are documented above. CARDIOVASCULAR: There is a regular rate and rhythm. PULMONARY: There is good air exchange bilaterally without wheezing or rales. Her left upper arm fistula has an excellent thrill. She has a palpable left radial pulse.  MEDICAL ISSUES:  STAGE IV CHRONIC KIDNEY DISEASE: Her fistula was placed on 01/27/2015. Normally, especially for a basilic vein transposition, I would prefer to wait 3 months before using it. However, as Dr. Moshe Cipro would like to start dialysis soon I think he would be safe to begin using the fistula on 04/20/2015. I will see her back as needed.  HYPERTENSION: The patient's initial blood pressure today was elevated. We repeated this and this was still elevated. We have encouraged the patient to follow up with their primary care physician for management of their blood pressure.   Scot Dock,  [facility] [employer]: [phone]

## 2015-06-22 ENCOUNTER — Telehealth: Payer: Self-pay

## 2015-06-22 NOTE — Telephone Encounter (Signed)
Called pt. Back.  Advised that the dialysis center will assess fistula site in the AM.  Pt. C/o black and blue discoloration surrounding the left arm with a hard knot on top of the fistula.  Reported that the area is more painful.   Advised if her symptoms are worsening, she will need to go to the ER tonight.   Pt. verb. understanding.

## 2015-06-22 NOTE — Telephone Encounter (Signed)
Phone call from pt.  Reported that her left arm AVF infiltrated at Dialysis on Saturday.  Reported that the infiltration occurred at the end of the treatment, and there was "quite a lot of bleeding, and it swelled as large as a softball."   Reported that today, the fistula area has a "knot that is golf ball sized, is black and blue, hot to touch, and very tender."  Stated there has been 3 episodes that the AVF got infiltrated since she stared HD 7 weeks ago.  Phone call to [facility].  Spoke with nurse, that reported at end of the treatment on Saturday, the pt. Jerked, and the arterial side bled.  Reported there was immediate swelling, and ice was applied.  Stated the swelling had decreased some.  Also reported that the pt. Was advised to come into the Dialysis center today, to have site checked, but was not able to, since she had no transportation.  The nurse from [facility] advised that they would assess it tomorrow, but to go to the ER if symptoms worsened.

## 2015-09-14 ENCOUNTER — Emergency Department (HOSPITAL_COMMUNITY): Payer: Medicare Other

## 2015-09-14 ENCOUNTER — Encounter (HOSPITAL_COMMUNITY): Payer: Self-pay | Admitting: Emergency Medicine

## 2015-09-14 ENCOUNTER — Observation Stay (HOSPITAL_COMMUNITY)
Admission: EM | Admit: 2015-09-14 | Discharge: 2015-09-16 | Disposition: A | Discharge status: Home / Self Care | Payer: Medicare Other | Attending: Oncology | Admitting: Oncology

## 2015-09-14 DIAGNOSIS — A084 Viral intestinal infection, unspecified: Principal | ICD-10-CM | POA: Insufficient documentation

## 2015-09-14 DIAGNOSIS — I120 Hypertensive chronic kidney disease with stage 5 chronic kidney disease or end stage renal disease: Secondary | ICD-10-CM | POA: Insufficient documentation

## 2015-09-14 DIAGNOSIS — D631 Anemia in chronic kidney disease: Secondary | ICD-10-CM | POA: Insufficient documentation

## 2015-09-14 DIAGNOSIS — N186 End stage renal disease: Secondary | ICD-10-CM | POA: Diagnosis present

## 2015-09-14 DIAGNOSIS — Z79899 Other long term (current) drug therapy: Secondary | ICD-10-CM | POA: Insufficient documentation

## 2015-09-14 DIAGNOSIS — K529 Noninfective gastroenteritis and colitis, unspecified: Secondary | ICD-10-CM | POA: Diagnosis present

## 2015-09-14 DIAGNOSIS — F411 Generalized anxiety disorder: Secondary | ICD-10-CM | POA: Insufficient documentation

## 2015-09-14 DIAGNOSIS — R509 Fever, unspecified: Secondary | ICD-10-CM | POA: Diagnosis present

## 2015-09-14 DIAGNOSIS — R079 Chest pain, unspecified: Secondary | ICD-10-CM | POA: Diagnosis present

## 2015-09-14 DIAGNOSIS — E8889 Other specified metabolic disorders: Secondary | ICD-10-CM | POA: Diagnosis not present

## 2015-09-14 DIAGNOSIS — I10 Essential (primary) hypertension: Secondary | ICD-10-CM | POA: Diagnosis present

## 2015-09-14 DIAGNOSIS — R1115 Cyclical vomiting syndrome unrelated to migraine: Secondary | ICD-10-CM | POA: Diagnosis present

## 2015-09-14 DIAGNOSIS — Z992 Dependence on renal dialysis: Secondary | ICD-10-CM | POA: Diagnosis not present

## 2015-09-14 DIAGNOSIS — N2581 Secondary hyperparathyroidism of renal origin: Secondary | ICD-10-CM | POA: Insufficient documentation

## 2015-09-14 HISTORY — DX: End stage renal disease: N18.6

## 2015-09-14 HISTORY — DX: Gastro-esophageal reflux disease without esophagitis: K21.9

## 2015-09-14 HISTORY — DX: Migraine, unspecified, not intractable, without status migrainosus: G43.909

## 2015-09-14 HISTORY — DX: Dependence on renal dialysis: Z99.2

## 2015-09-14 HISTORY — DX: Iron deficiency anemia, unspecified: D50.9

## 2015-09-14 LAB — CBC
HCT: 35.6 % — ABNORMAL LOW (ref 36.0–46.0)
Hemoglobin: 11.4 g/dL — ABNORMAL LOW (ref 12.0–15.0)
MCH: 30.8 pg (ref 26.0–34.0)
MCHC: 32 g/dL (ref 30.0–36.0)
MCV: 96.2 fL (ref 78.0–100.0)
PLATELETS: 233 10*3/uL (ref 150–400)
RBC: 3.7 MIL/uL — AB (ref 3.87–5.11)
RDW: 16 % — ABNORMAL HIGH (ref 11.5–15.5)
WBC: 5.5 10*3/uL (ref 4.0–10.5)

## 2015-09-14 LAB — COMPREHENSIVE METABOLIC PANEL
ALT: 17 U/L (ref 14–54)
AST: 26 U/L (ref 15–41)
Albumin: 3.6 g/dL (ref 3.5–5.0)
Alkaline Phosphatase: 53 U/L (ref 38–126)
Anion gap: 18 — ABNORMAL HIGH (ref 5–15)
BUN: 25 mg/dL — ABNORMAL HIGH (ref 6–20)
CHLORIDE: 99 mmol/L — AB (ref 101–111)
CO2: 23 mmol/L (ref 22–32)
CREATININE: 8.91 mg/dL — AB (ref 0.44–1.00)
Calcium: 10.3 mg/dL (ref 8.9–10.3)
GFR, EST AFRICAN AMERICAN: 6 mL/min — AB (ref >60)
GFR, EST NON AFRICAN AMERICAN: 5 mL/min — AB (ref >60)
Glucose, Bld: 102 mg/dL — ABNORMAL HIGH (ref 65–99)
POTASSIUM: 4.4 mmol/L (ref 3.5–5.1)
Sodium: 140 mmol/L (ref 135–145)
Total Bilirubin: 1 mg/dL (ref 0.3–1.2)
Total Protein: 6.6 g/dL (ref 6.5–8.1)

## 2015-09-14 LAB — URINALYSIS, ROUTINE W REFLEX MICROSCOPIC
Bilirubin Urine: NEGATIVE
GLUCOSE, UA: 100 mg/dL — AB
Ketones, ur: NEGATIVE mg/dL
LEUKOCYTES UA: NEGATIVE
Nitrite: NEGATIVE
PROTEIN: 100 mg/dL — AB
Specific Gravity, Urine: 1.009 (ref 1.005–1.030)
pH: 8.5 — ABNORMAL HIGH (ref 5.0–8.0)

## 2015-09-14 LAB — URINE MICROSCOPIC-ADD ON

## 2015-09-14 LAB — LIPASE, BLOOD: LIPASE: 22 U/L (ref 11–51)

## 2015-09-14 LAB — I-STAT CG4 LACTIC ACID, ED: Lactic Acid, Venous: 1.44 mmol/L (ref 0.5–2.0)

## 2015-09-14 MED ORDER — NEPRO/CARBSTEADY PO LIQD
237.0000 mL | Freq: Two times a day (BID) | ORAL | Status: DC
Start: 2015-09-15 — End: 2015-09-15
  Filled 2015-09-14 (×4): qty 237

## 2015-09-14 MED ORDER — METOCLOPRAMIDE HCL 5 MG/ML IJ SOLN
10.0000 mg | Freq: Once | INTRAMUSCULAR | Status: AC
  Administered 2015-09-14: 10 mg via INTRAVENOUS
  Filled 2015-09-14: qty 2

## 2015-09-14 MED ORDER — OXYCODONE HCL 5 MG PO TABS: 5.0000 mg | ORAL_TABLET | Freq: Two times a day (BID) | ORAL | Status: DC

## 2015-09-14 MED ORDER — ACETAMINOPHEN 500 MG PO TABS
1000.0000 mg | ORAL_TABLET | Freq: Four times a day (QID) | ORAL | Status: DC | PRN
  Administered 2015-09-14 – 2015-09-16 (×2): 1000 mg via ORAL
  Filled 2015-09-14 (×4): qty 2

## 2015-09-14 MED ORDER — SODIUM CHLORIDE 0.9% FLUSH: 3.0000 mL | INTRAVENOUS | Status: DC | PRN

## 2015-09-14 MED ORDER — TRAMADOL HCL 50 MG PO TABS: 50.0000 mg | ORAL_TABLET | Freq: Two times a day (BID) | ORAL | Status: DC | PRN

## 2015-09-14 MED ORDER — HYDROMORPHONE HCL 1 MG/ML IJ SOLN
0.5000 mg | Freq: Once | INTRAMUSCULAR | Status: AC
  Administered 2015-09-14: 0.5 mg via INTRAVENOUS
  Filled 2015-09-14: qty 1

## 2015-09-14 MED ORDER — SEVELAMER CARBONATE 800 MG PO TABS
1600.0000 mg | ORAL_TABLET | Freq: Three times a day (TID) | ORAL | Status: DC
  Administered 2015-09-14 – 2015-09-16 (×5): 1600 mg via ORAL
  Filled 2015-09-14 (×5): qty 2

## 2015-09-14 MED ORDER — SODIUM CHLORIDE 0.9 % IV SOLN: INTRAVENOUS | Status: DC

## 2015-09-14 MED ORDER — OXYCODONE HCL 5 MG PO TABS
5.0000 mg | ORAL_TABLET | Freq: Two times a day (BID) | ORAL | Status: DC | PRN
  Administered 2015-09-14 – 2015-09-16 (×3): 5 mg via ORAL
  Filled 2015-09-14 (×3): qty 1

## 2015-09-14 MED ORDER — SODIUM CHLORIDE 0.9 % IV SOLN: 250.0000 mL | INTRAVENOUS | Status: DC | PRN

## 2015-09-14 MED ORDER — DOCUSATE SODIUM 100 MG PO CAPS
100.0000 mg | ORAL_CAPSULE | Freq: Two times a day (BID) | ORAL | Status: DC
  Filled 2015-09-14 (×3): qty 1

## 2015-09-14 MED ORDER — DEXTROSE 5 % IV SOLN
1.0000 g | Freq: Once | INTRAVENOUS | Status: AC
  Administered 2015-09-14: 1 g via INTRAVENOUS
  Filled 2015-09-14: qty 10

## 2015-09-14 MED ORDER — RENA-VITE PO TABS
1.0000 | ORAL_TABLET | Freq: Every day | ORAL | Status: DC
  Filled 2015-09-14 (×2): qty 1

## 2015-09-14 MED ORDER — ACETAMINOPHEN 650 MG RE SUPP
650.0000 mg | Freq: Once | RECTAL | Status: AC
  Administered 2015-09-14: 650 mg via RECTAL
  Filled 2015-09-14: qty 1

## 2015-09-14 MED ORDER — HYDRALAZINE HCL 25 MG PO TABS
25.0000 mg | ORAL_TABLET | Freq: Two times a day (BID) | ORAL | Status: DC
  Administered 2015-09-14 – 2015-09-16 (×4): 25 mg via ORAL
  Filled 2015-09-14 (×4): qty 1

## 2015-09-14 MED ORDER — ONDANSETRON HCL 4 MG/2ML IJ SOLN
4.0000 mg | Freq: Once | INTRAMUSCULAR | Status: AC
  Administered 2015-09-14: 4 mg via INTRAVENOUS
  Filled 2015-09-14: qty 2

## 2015-09-14 MED ORDER — ONDANSETRON HCL 4 MG/2ML IJ SOLN
4.0000 mg | Freq: Four times a day (QID) | INTRAMUSCULAR | Status: DC | PRN
  Administered 2015-09-14 – 2015-09-15 (×2): 4 mg via INTRAVENOUS
  Filled 2015-09-14 (×2): qty 2

## 2015-09-14 MED ORDER — TRAMADOL HCL 50 MG PO TABS: 50.0000 mg | ORAL_TABLET | Freq: Four times a day (QID) | ORAL | Status: DC | PRN

## 2015-09-14 MED ORDER — SODIUM CHLORIDE 0.9 % IV BOLUS (SEPSIS)
1000.0000 mL | Freq: Once | INTRAVENOUS | Status: AC
  Administered 2015-09-14: 1000 mL via INTRAVENOUS

## 2015-09-14 MED ORDER — CARVEDILOL 25 MG PO TABS
25.0000 mg | ORAL_TABLET | Freq: Two times a day (BID) | ORAL | Status: DC
  Administered 2015-09-14 – 2015-09-16 (×4): 25 mg via ORAL
  Filled 2015-09-14 (×4): qty 1

## 2015-09-14 MED ORDER — CLONIDINE HCL ER 0.1 MG PO TB12
0.1000 mg | ORAL_TABLET | Freq: Two times a day (BID) | ORAL | Status: DC
  Administered 2015-09-15 – 2015-09-16 (×3): 0.1 mg via ORAL
  Filled 2015-09-14 (×6): qty 1

## 2015-09-14 MED ORDER — HEPARIN SODIUM (PORCINE) 5000 UNIT/ML IJ SOLN
5000.0000 [IU] | Freq: Three times a day (TID) | INTRAMUSCULAR | Status: DC
Start: 2015-09-14 — End: 2015-09-16
  Administered 2015-09-14 – 2015-09-16 (×6): 5000 [IU] via SUBCUTANEOUS
  Filled 2015-09-14 (×6): qty 1

## 2015-09-14 MED ORDER — SODIUM CHLORIDE 0.9% FLUSH
3.0000 mL | Freq: Two times a day (BID) | INTRAVENOUS | Status: DC
  Administered 2015-09-14 – 2015-09-15 (×3): 3 mL via INTRAVENOUS

## 2015-09-14 MED ORDER — ALPRAZOLAM 0.5 MG PO TABS
0.5000 mg | ORAL_TABLET | Freq: Three times a day (TID) | ORAL | Status: DC | PRN
  Administered 2015-09-14 – 2015-09-16 (×5): 0.5 mg via ORAL
  Filled 2015-09-14 (×6): qty 1

## 2015-09-14 MED ORDER — ONDANSETRON HCL 4 MG/2ML IJ SOLN: 4.0000 mg | Freq: Three times a day (TID) | INTRAMUSCULAR | Status: DC | PRN

## 2015-09-14 NOTE — ED Notes (Signed)
Pt oob to br walks with steady gait

## 2015-09-14 NOTE — Progress Notes (Addendum)
Patient complaining of nausea and vomiting unrelieved by IV Zofran.  She reports that Reglan helps her more than Zofran.  Also reporting severe abdominal pain;unable to tolerate tylenol or oxycodone at this time due to vomiting.  Patient is also requesting medication to help her sleep.Dr. Benjamine Mola notified.

## 2015-09-14 NOTE — Progress Notes (Signed)
Pt. Asked if she could take a shower . I asked the RN and she said yes as long as we cover her IV with aqua guard. I was going to her ready for her shower and pt. Stated she wanted to wait for later on during the day once she rested for a bit

## 2015-09-14 NOTE — H&P (Signed)
Date: 09/14/2015               Patient Name:  Shauniece Yates MRN: [id number]  DOB: 12-07-1975 Age / Sex: 40 y.o., female   PCP: Enid Skeens, MD         Medical Service: Internal Medicine Teaching Service         Attending Physician: Dr. Annia Belt, MD    First Contact: Dr. Blane Ohara Pager: [phone]  Second Contact: Dr. Jacques Earthly Pager: [phone]       After Hours (After 5p/  First Contact Pager: [phone]  weekends / holidays): Second Contact Pager: [phone]   Chief Complaint: Nausea, vomiting, abdominal pain and subjective fevers  History of Present Illness: Valerie Yates is a 39 year old with HTN complicated by ESRD on TTS HD, as well as chronic anxiety who presents with nausea, vomiting, abdominal pain and subjective fevers that started yesterday. She felt well when going to dialysis on Saturday, with only a slight generalized headache that is common after her sessions. However, starting yesterday she started to have nausea and NBNB vomiting as well as subjective fevers. After multiple episodes of vomiting, she also started noticing sharp abdominal pain in the subxiphoid region that is nonradiating as well as intermittent squeezing abdominal pain in her right lower quadrant that is nonradiating. Her PCP gave her Zofran, which did not help and prompted her to come into the ED. She has never had these symptoms before. She denies other symptoms, including chest pain, palpitations, cough, any urinary frequency, urgency, dysuria, change in odor, diarrhea (she has chronic constipation, last bowel movement 2 days ago), or vaginal discharge. She denies any recent travel or sick contacts. She is a nonsmoker, denies any alcohol abuse, or any other recreational drug use. She is compliant with all of her medications. She has no other concerns at this time.  Meds: Current Facility-Administered Medications  Medication Dose Route Frequency Provider Last Rate Last Dose  . 0.9 %  sodium  chloride infusion  250 mL Intravenous PRN Milagros Loll, MD      . acetaminophen (TYLENOL) tablet 1,000 mg  1,000 mg Oral Q6H PRN Norval Gable, MD      . ALPRAZolam Duanne Moron) tablet 0.5 mg  0.5 mg Oral TID PRN Norval Gable, MD      . carvedilol (COREG) tablet 25 mg  25 mg Oral BID WC Milagros Loll, MD      . cloNIDine HCl [facility]) ER tablet 0.1 mg  0.1 mg Oral BID Milagros Loll, MD      . docusate sodium (COLACE) capsule 100 mg  100 mg Oral BID Milagros Loll, MD      . heparin injection 5,000 Units  5,000 Units Subcutaneous Q8H Milagros Loll, MD      . hydrALAZINE (APRESOLINE) tablet 25 mg  25 mg Oral BID Milagros Loll, MD      . ondansetron [facility]) injection 4 mg  4 mg Intravenous Q6H PRN Norval Gable, MD      . oxyCODONE (Oxy IR/ROXICODONE) immediate release tablet 5 mg  5 mg Oral BID PRN Norval Gable, MD      . sevelamer carbonate (RENVELA) tablet 1,600 mg  1,600 mg Oral TID WC Norval Gable, MD      . sodium chloride flush (NS) 0.9 % injection 3 mL  3 mL Intravenous Q12H Milagros Loll, MD      . sodium chloride flush (NS)  0.9 % injection 3 mL  3 mL Intravenous PRN Milagros Loll, MD        Allergies: Allergies as of 09/14/2015 - Review Complete 09/14/2015  Allergen Reaction Noted  . Azithromycin Anaphylaxis and Rash 04/08/2015  . Erythromycin Hives, Shortness Of Breath, and Rash 03/26/2014  . Penicillins Hives and Shortness Of Breath 02/09/2014  . Morphine sulfate Rash 03/26/2014   Past Medical History  Diagnosis Date  . Hypertension   . Anemia   . 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 UTI (lower urinary tract infection)     recurrent  . Hyperparathyroidism due to renal insufficiency ([address])   . Complication of anesthesia     woke up before they took the endotra. tube, once was slow to wake up  . Shortness of breath dyspnea     with exertion  . Anxiety   . Chronic kidney disease (CKD)     not  on dialysis yet  Stage IV  . Headache     migraines   Past Surgical History  Procedure Laterality Date  . Abdominal hysterectomy    . Cholecystectomy    . Tubal ligation    . Arthroscopic shoulder Left   . Av fistula placement Left 01/27/2015    Procedure:  BASILIC VEIN TRANSPOSITION ;  Surgeon: Angelia Mould, MD;  Location: [facility] OR;  Service: Vascular;  Laterality: Left;   Family History  Problem Relation Age of Onset  . Cancer Mother     Breast   Social History   Social History  . Marital Status: Married    Spouse Name: N/A  . Number of Children: N/A  . Years of Education: N/A   Occupational History  . Not on file.   Social History Main Topics  . Smoking status: Never Smoker   . Smokeless tobacco: Never Used  . Alcohol Use: No  . Drug Use: No  . Sexual Activity: Not on file   Other Topics Concern  . Not on file   Social History Narrative    Review of Systems: Pertinent items noted in HPI and remainder of comprehensive ROS otherwise negative.  Physical Exam: Blood pressure 137/86, pulse 89, temperature 98.6 F (37 C), temperature source Oral, resp. rate 18, height 5\' 7"  (1.702 m), weight 128 lb (58.06 kg), SpO2 97 %.   Gen: Well-appearing, alert and oriented to person, place, and time HEENT: Oropharynx clear without erythema or exudate.  Neck: No cervical LAD, no thyromegaly or nodules, no JVD noted. CV: Normal rate, regular rhythm, no murmurs, rubs, or gallops Pulmonary: Normal effort, CTA bilaterally, no crackles or wheezes Abdominal: Mild point tenderness in the subxiphoid region. Moderate tenderness to palpation in the right lower quadrant without rebound, guarding. Bowel sounds heard. Murphy's sign negative. No hepatosplenomegaly Extremities: Distal pulses 2+ in upper and lower extremities bilaterally, no tenderness, erythema or edema. LUE AVF with palpable thrill.  Neuro: CN II-XII grossly intact, no focal weakness or sensory deficits noted Skin:  No atypical appearing moles. No rashes  Lab results: Basic Metabolic Panel:  Recent Labs  09/14/15 0640  NA 140  K 4.4  CL 99*  CO2 23  GLUCOSE 102*  BUN 25*  CREATININE 8.91*  CALCIUM 10.3   Liver Function Tests:  Recent Labs  09/14/15 0640  AST 26  ALT 17  ALKPHOS 53  BILITOT 1.0  PROT 6.6  ALBUMIN 3.6    Recent Labs  09/14/15 0640  LIPASE 22  CBC:  Recent Labs  09/14/15 0640  WBC 5.5  HGB 11.4*  HCT 35.6*  MCV 96.2  PLT 233   Urinalysis:  Recent Labs  09/14/15 0630  COLORURINE YELLOW  LABSPEC 1.009  PHURINE 8.5*  GLUCOSEU 100*  HGBUR SMALL*  BILIRUBINUR NEGATIVE  KETONESUR NEGATIVE  PROTEINUR 100*  NITRITE NEGATIVE  LEUKOCYTESUR NEGATIVE   Imaging results:  Dg Chest Port 1 View  09/14/2015  CLINICAL DATA:  Fever and chest pain with vomiting for 2 days EXAM: PORTABLE CHEST 1 VIEW COMPARISON:  February 16, 2015 FINDINGS: There is no edema or consolidation. Heart is borderline enlarged with pulmonary vascularity within normal limits. No adenopathy. No bone lesions. IMPRESSION: Borderline cardiac enlargement.  No edema or consolidation. Electronically Signed   By: Lowella Grip III M.D.   On: 09/14/2015 07:38   Ct Renal Stone Study  09/14/2015  CLINICAL DATA:  Fever and lower abdominal pain. Vomiting. Chronic renal failure EXAM: CT ABDOMEN AND PELVIS WITHOUT CONTRAST TECHNIQUE: Multidetector CT imaging of the abdomen and pelvis was performed following the standard protocol without oral or intravenous contrast material administration. COMPARISON:  January 15, 2013 FINDINGS: Lower chest: There is patchy bibasilar atelectasis. There is cardiomegaly. The pericardium is not appreciably thickened. Hepatobiliary: No focal liver lesions are identified on this noncontrast enhanced study. Gallbladder is absent. Common bile duct measures 10 mm. Upper normal given post cholecystectomy state. No biliary duct mass or calculus is evident on this noncontrast  enhanced study. Pancreas: No pancreatic mass or inflammatory focus is evident. Spleen: No splenic lesions are evident. Spleen is prominent measuring 12.6 x 12.6 x 5.7 cm. Measured splenic volume is 452 cubic cm. Adrenals/Urinary Tract: Adrenals appear normal bilaterally. Kidneys are somewhat atrophic with renal cortical thinning and scarring bilaterally. There is no renal mass or hydronephrosis on either side. There is no renal or ureteral calculus on either side. Urinary bladder is midline with wall thickness within normal limits. Stomach/Bowel: Rectum is distended with air and stool. There is no rectal wall thickening. Elsewhere, there is no bowel wall or mesenteric thickening. No bowel obstruction. No free air or portal venous air. Vascular/Lymphatic: There is no abdominal aortic aneurysm. No vascular lesions are evident on this noncontrast enhanced study. There is no appreciable adenopathy in the abdomen or pelvis. Reproductive: Uterus is retroverted. There is no pelvic mass or pelvic fluid collection. Other: The appendix appears normal. No ascites or abscess in the abdomen or pelvis. Musculoskeletal: There are no blastic or lytic bone lesions. There is no abdominal wall or intramuscular lesion. IMPRESSION: Renal scarring with cortical thinning bilaterally. No renal or ureteral calculus. No hydronephrosis on either side. No urinary bladder wall thickening. No bowel wall thickening or bowel obstruction. Rectum distended with stool and air without rectal wall thickening. No abscess.  Appendix appears normal. Gallbladder absent. Common bile duct upper normal for post cholecystectomy state. Spleen mildly prominent without focal lesion evident. Electronically Signed   By: Lowella Grip III M.D.   On: 09/14/2015 09:02   Other results: EKG: sinus tachycardia.  Assessment & Plan by Problem: 1. Viral gastroenteritis - patient with nausea, vomiting, and abdominal pain as well as subjective fevers for the past 2  days. Here, patient is afebrile with mild tachycardia but otherwise hemodynamically stable. Patient is not uremic and does not have a leukocytosis. UA is dirty with few bacteria, few squames, but nitrite negative and in the setting of ESRD without any urinary symptoms, this is likely not a UTI. CT  abdomen pelvis in the ED shows no identifiable cause for her symptoms. Patient with crampy right lower quadrant pain, but exam and CT not suggestive of appendicitis. Patient with surgically removed gallbladder, ovaries, and uterus approximately 10 years ago. Presentation most likely viral gastroenteritis.  -Zofran IV 4 mg every 6 hours as needed -Acetaminophen 5000 mg every 6 hours as needed -Restart home oxy IR 5 mg every 12 hours as needed for breakthrough pain -Renal diet for now; advance diet as tolerated -We'll hold off on an additional IV fluids for now -Patient without any other signs or symptoms suggestive of flu at this time, so will defer testing at this time  2. 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 Last went to dialysis on Saturday. Currently undergoing transplant evaluation at [facility] home hydralazine, clonidine, and carvedilol -Continue home sevelamer -We'll contact nephrology today; HD as scheduled tomorrow  3. Generalized anxiety disorder -Continue home alprazolam 0.5 mg 3 times a day when necessary  DVT PPX - heparin  Dispo: Disposition is deferred at this time, awaiting improvement of current medical problems. Anticipated discharge in approximately 1-2 day(s).   The patient does have a current PCP (Enid Skeens, MD) and does need an [facility] hospital follow-up appointment after discharge.  The patient does not have transportation limitations that hinder transportation to clinic appointments.  Signed: Norval Gable, MD 09/14/2015, 1:29 PM

## 2015-09-14 NOTE — Consult Note (Signed)
[facility] Renal Consultation Note  Indication for Consultation:  Management of ESRD/hemodialysis; anemia, hypertension/volume and secondary hyperparathyroidism  HPI: Valerie Yates is a 40 y.o. female with ESRD TTS / Chronic Anxiety Disorder/ presented to ER with nausea, vomiting, abdominal pain and subjective fevers that started yesterday . Tolerated last HD Saturday. Admitted with viral gastroenteritis . Denies any sob,cough  chest pain, dizziness,sick contacts or recent travel. Denies uti symptoms . We are asked to consult for HD and ESRD needs.      Past Medical History  Diagnosis Date  . Hypertension   . Anemia   . 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 UTI (lower urinary tract infection)     recurrent  . Hyperparathyroidism due to renal insufficiency ([place])   . Complication of anesthesia     woke up before they took the endotra. tube, once was slow to wake up  . Shortness of breath dyspnea     with exertion  . Anxiety   . Chronic kidney disease (CKD)     not on dialysis yet  Stage IV  . Headache     migraines    Past Surgical History  Procedure Laterality Date  . Abdominal hysterectomy    . Cholecystectomy    . Tubal ligation    . Arthroscopic shoulder Left   . Av fistula placement Left 01/27/2015    Procedure:  BASILIC VEIN TRANSPOSITION ;  Surgeon: Angelia Mould, MD;  Location: [facility] OR;  Service: Vascular;  Laterality: Left;      Family History  Problem Relation Age of Onset  . Cancer Mother     Breast      reports that she has never smoked. She has never used smokeless tobacco. She reports that she does not drink alcohol or use illicit drugs.   Allergies  Allergen Reactions  . Azithromycin Anaphylaxis and Rash  . Erythromycin Hives, Shortness Of Breath and Rash    Acne Aid-Dermatological.  . Penicillins Hives and Shortness Of Breath  . Morphine Sulfate Rash    Concentrate-Analgesics-Opioid      Prior to Admission medications   Medication Sig Start Date End Date Taking? Authorizing Provider  ALPRAZolam Duanne Moron) 0.5 MG tablet Take 0.5 mg by mouth 3 (three) times daily as needed for anxiety.   Yes Historical Provider, MD  carvedilol (COREG) 25 MG tablet Take 25 mg by mouth 2 (two) times daily with a meal.   Yes Historical Provider, MD  cloNIDine HCl (KAPVAY) 0.1 MG TB12 ER tablet Take 0.1 mg by mouth 2 (two) times daily.    Yes Historical Provider, MD  hydrALAZINE (APRESOLINE) 25 MG tablet Take 25 mg by mouth 2 (two) times daily. 09/09/15  Yes Historical Provider, MD  ondansetron (ZOFRAN) 4 MG tablet Take 4 mg by mouth every 8 (eight) hours as needed for nausea or vomiting.   Yes Historical Provider, MD  oxyCODONE (OXY IR/ROXICODONE) 5 MG immediate release tablet Take 5 mg by mouth 2 (two) times daily. 09/09/15  Yes Historical Provider, MD  RENVELA 800 MG tablet Take 1,600 mg by mouth 3 (three) times daily before meals. 09/07/15  Yes Historical Provider, MD    FYB:OFBPZW chloride, acetaminophen, ALPRAZolam, ondansetron (ZOFRAN) IV, oxyCODONE, sodium chloride flush  Results for orders placed or performed during the hospital encounter of 09/14/15 (from the past 48 hour(s))  Urinalysis, Routine w reflex microscopic     Status: Abnormal   Collection Time: 09/14/15  6:30 AM  Result Value Ref Range   Color, Urine YELLOW YELLOW   APPearance CLOUDY (A) CLEAR   Specific Gravity, Urine 1.009 1.005 - 1.030   pH 8.5 (H) 5.0 - 8.0   Glucose, UA 100 (A) NEGATIVE mg/dL   Hgb urine dipstick SMALL (A) NEGATIVE   Bilirubin Urine NEGATIVE NEGATIVE   Ketones, ur NEGATIVE NEGATIVE mg/dL   Protein, ur 100 (A) NEGATIVE mg/dL   Nitrite NEGATIVE NEGATIVE   Leukocytes, UA NEGATIVE NEGATIVE  Urine microscopic-add on     Status: Abnormal   Collection Time: 09/14/15  6:30 AM  Result Value Ref Range   Squamous Epithelial / LPF 6-30 (A) NONE SEEN   WBC, UA 6-30 0 - 5 WBC/hpf   RBC / HPF 0-5 0 - 5 RBC/hpf    Bacteria, UA FEW (A) NONE SEEN  Lipase, blood     Status: None   Collection Time: 09/14/15  6:40 AM  Result Value Ref Range   Lipase 22 11 - 51 U/L  Comprehensive metabolic panel     Status: Abnormal   Collection Time: 09/14/15  6:40 AM  Result Value Ref Range   Sodium 140 135 - 145 mmol/L   Potassium 4.4 3.5 - 5.1 mmol/L   Chloride 99 (L) 101 - 111 mmol/L   CO2 23 22 - 32 mmol/L   Glucose, Bld 102 (H) 65 - 99 mg/dL   BUN 25 (H) 6 - 20 mg/dL   Creatinine, Ser 8.91 (H) 0.44 - 1.00 mg/dL   Calcium 10.3 8.9 - 10.3 mg/dL   Total Protein 6.6 6.5 - 8.1 g/dL   Albumin 3.6 3.5 - 5.0 g/dL   AST 26 15 - 41 U/L   ALT 17 14 - 54 U/L   Alkaline Phosphatase 53 38 - 126 U/L   Total Bilirubin 1.0 0.3 - 1.2 mg/dL   GFR calc non Af Amer 5 (L) >60 mL/min   GFR calc Af Amer 6 (L) >60 mL/min    Comment: (NOTE) The eGFR has been calculated using the CKD EPI equation. This calculation has not been validated in all clinical situations. eGFR's persistently <60 mL/min signify possible Chronic Kidney Disease.    Anion gap 18 (H) 5 - 15  CBC     Status: Abnormal   Collection Time: 09/14/15  6:40 AM  Result Value Ref Range   WBC 5.5 4.0 - 10.5 K/uL   RBC 3.70 (L) 3.87 - 5.11 MIL/uL   Hemoglobin 11.4 (L) 12.0 - 15.0 g/dL   HCT 35.6 (L) 36.0 - 46.0 %   MCV 96.2 78.0 - 100.0 fL   MCH 30.8 26.0 - 34.0 pg   MCHC 32.0 30.0 - 36.0 g/dL   RDW 16.0 (H) 11.5 - 15.5 %   Platelets 233 150 - 400 K/uL  I-Stat CG4 Lactic Acid, ED     Status: None   Collection Time: 09/14/15  7:58 AM  Result Value Ref Range   Lactic Acid, Venous 1.44 0.5 - 2.0 mmol/L    ROS:  See hpi  Physical Exam: Filed Vitals:   09/14/15 1115 09/14/15 1206  BP: 111/76 137/86  Pulse: 94 89  Temp:  98.6 F (37 C)  Resp: 18 18     General: alert thin WF NAD  HEENT: [place] , MM dry , EOMI   Neck: supple no jvd Heart: RRR, no mur, rub or gallop Lungs: CTA Abdomen: BS pos SOft , tender epigastric, no rebound nondistend Extremities:   No pedal edema Skin:  No overt rash Neuro: alert walking in rm ,no overt focal defiicts Dialysis Access: Pos bruit LUA AVF  Dialysis Orders: Center: [facility]   on TTS . EDW 60 HD Bath 2k. 2.25ca   Time 3hr Heparin 2000. Access LUA AVF BFR 350  DFR AF 1.5     Mircera 75 mcg last on 5/02  q 2wks   Units IV/HD  Venofer  68m qweek   Other  Op labs  HGB 9.6  5/04  Ca 9.7 phos4.3 PTH 167  Assessment/Plan 1. Viral gastroenteritis- per admit team 2. ESRD -  HD TTS  HD in AM (needs admit state before in pt hd done) fu am labs  3. Hypertension/volume  - bp stable .makes urine and usually less than 1luf as op / will keep even no UF sec to N/v. On 3 bp medsas op   =controlled bp  now element ,may need taper down in hosp.  At op kid center bp mostly elevated ?? compliance as op with bp meds 4. Anemia   Of ESRD-esa / fe as op, hold for hgb 11.4  follow up hgb  trend 5. Metabolic bone disease -   Ca /phos stable as op  No vit d on hd/ on renvela as binder 6.  Generalized Anxiety  Disorder- on meds per admit   DErnest Haber PA-C [place]ine [phone]/12/2015, 1:38 PM

## 2015-09-14 NOTE — ED Notes (Signed)
From home reports vomiting since 1000 am yesterday.  Started having epigastric pain around 0300.  Also reports right lower quad abdominal pain and fever since yesterday.  Highest temp reported 102.3.

## 2015-09-14 NOTE — ED Notes (Signed)
MD at bedside. 

## 2015-09-14 NOTE — ED Provider Notes (Signed)
CSN: [id number]     Arrival date & time 09/14/15  [id number] History   First MD Initiated Contact with Patient 09/14/15 [phone]     Chief Complaint  Patient presents with  . Chest Pain  . Abdominal Pain     (Consider location/radiation/quality/duration/timing/severity/associated sxs/prior Treatment) HPI Comments: 40 year old female with history of renal disease secondary to uncontrolled high blood pressure, thyroid disease, anemia, recent dialysis last dialyzed Saturday resents with fever and abdominal pain. Yesterday patient started with nausea vomiting and right abdominal discomfort. Today patient has right lower abdominal discomfort and epigastric pain worse with vomiting and palpation. Patient has never had this before. Patient gallbladder removed in the past but still has her appendix. No urinary symptoms patient does still produce significant amount urine.  Patient is a 40 y.o. female presenting with chest pain and abdominal pain. The history is provided by the patient.  Chest Pain Associated symptoms: abdominal pain, fever, headache, nausea and vomiting   Associated symptoms: no back pain and no shortness of breath   Abdominal Pain Associated symptoms: chest pain, chills, fever, nausea and vomiting   Associated symptoms: no dysuria and no shortness of breath     Past Medical History  Diagnosis Date  . Hypertension   . Anemia   . 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 UTI (lower urinary tract infection)     recurrent  . Hyperparathyroidism due to renal insufficiency ([place])   . Complication of anesthesia     woke up before they took the endotra. tube, once was slow to wake up  . Shortness of breath dyspnea     with exertion  . Anxiety   . Chronic kidney disease (CKD)     not on dialysis yet  Stage IV  . Headache     migraines   Past Surgical History  Procedure Laterality Date  . Abdominal hysterectomy    . Cholecystectomy    . Tubal  ligation    . Arthroscopic shoulder Left   . Av fistula placement Left 01/27/2015    Procedure:  BASILIC VEIN TRANSPOSITION ;  Surgeon: Angelia Mould, MD;  Location: [facility] OR;  Service: Vascular;  Laterality: Left;   Family History  Problem Relation Age of Onset  . Cancer Mother     Breast   Social History  Substance Use Topics  . Smoking status: Never Smoker   . Smokeless tobacco: Never Used  . Alcohol Use: No   OB History    No data available     Review of Systems  Constitutional: Positive for fever, chills and appetite change.  HENT: Negative for congestion.   Eyes: Negative for visual disturbance.  Respiratory: Negative for shortness of breath.   Cardiovascular: Positive for chest pain.  Gastrointestinal: Positive for nausea, vomiting and abdominal pain.  Genitourinary: Negative for dysuria and flank pain.  Musculoskeletal: Negative for back pain, neck pain and neck stiffness.  Skin: Negative for rash.  Neurological: Positive for light-headedness and headaches.      Allergies  Azithromycin; Erythromycin; Penicillins; and Morphine sulfate  Home Medications   Prior to Admission medications   Medication Sig Start Date End Date Taking? Authorizing Provider  ALPRAZolam Duanne Moron) 0.5 MG tablet Take 0.5 mg by mouth 3 (three) times daily as needed for anxiety.   Yes Historical Provider, MD  carvedilol (COREG) 25 MG tablet Take 25 mg by mouth 2 (two) times daily with a meal.   Yes Historical Provider, MD  cloNIDine HCl (KAPVAY) 0.1 MG TB12 ER tablet Take 0.1 mg by mouth 2 (two) times daily.    Yes Historical Provider, MD  hydrALAZINE (APRESOLINE) 25 MG tablet Take 25 mg by mouth 2 (two) times daily. 09/09/15  Yes Historical Provider, MD  ondansetron (ZOFRAN) 4 MG tablet Take 4 mg by mouth every 8 (eight) hours as needed for nausea or vomiting.   Yes Historical Provider, MD  oxyCODONE (OXY IR/ROXICODONE) 5 MG immediate release tablet Take 5 mg by mouth 2 (two) times daily.  09/09/15  Yes Historical Provider, MD  RENVELA 800 MG tablet Take 1,600 mg by mouth 3 (three) times daily before meals. 09/07/15  Yes Historical Provider, MD  oxyCODONE-acetaminophen (PERCOCET/ROXICET) 5-325 MG tablet Take 1 tablet by mouth every 6 (six) hours as needed. Patient not taking: Reported on 09/14/2015 03/06/15   Sharmon Leyden Nickel, NP   BP 136/105 mmHg  Pulse 98  Temp(Src) 99 F (37.2 C) (Oral)  Resp 16  Ht 5\' 7"  (1.702 m)  Wt 128 lb 1 oz (58.089 kg)  BMI 20.05 kg/m2  SpO2 97% Physical Exam  Constitutional: She is oriented to person, place, and time. She appears well-developed and well-nourished.  HENT:  Head: Normocephalic and atraumatic.  Dry mucous membranes  Eyes: Right eye exhibits no discharge. Left eye exhibits no discharge.  Neck: Normal range of motion. Neck supple. No tracheal deviation present.  Cardiovascular: Regular rhythm.  Tachycardia present.   Pulmonary/Chest: Effort normal and breath sounds normal.  Abdominal: Soft. She exhibits no distension. There is tenderness (ild epigastric and right lower quadrant). There is no guarding.  Musculoskeletal: She exhibits no edema.  Neurological: She is alert and oriented to person, place, and time.  Skin: Skin is warm. No rash noted.  Psychiatric: She has a normal mood and affect.  Nursing note and vitals reviewed.   ED Course  Procedures (including critical care time) Labs Review Labs Reviewed  COMPREHENSIVE METABOLIC PANEL - Abnormal; Notable for the following:    Chloride 99 (*)    Glucose, Bld 102 (*)    BUN 25 (*)    Creatinine, Ser 8.91 (*)    GFR calc non Af Amer 5 (*)    GFR calc Af Amer 6 (*)    Anion gap 18 (*)    All other components within normal limits  CBC - Abnormal; Notable for the following:    RBC 3.70 (*)    Hemoglobin 11.4 (*)    HCT 35.6 (*)    RDW 16.0 (*)    All other components within normal limits  URINALYSIS, ROUTINE W REFLEX MICROSCOPIC (NOT AT [facility]) - Abnormal; Notable for the  following:    APPearance CLOUDY (*)    pH 8.5 (*)    Glucose, UA 100 (*)    Hgb urine dipstick SMALL (*)    Protein, ur 100 (*)    All other components within normal limits  URINE MICROSCOPIC-ADD ON - Abnormal; Notable for the following:    Squamous Epithelial / LPF 6-30 (*)    Bacteria, UA FEW (*)    All other components within normal limits  CULTURE, BLOOD (ROUTINE X 2)  URINE CULTURE  CULTURE, BLOOD (ROUTINE X 2)  LIPASE, BLOOD  I-STAT CG4 LACTIC ACID, ED  I-STAT CG4 LACTIC ACID, ED  I-STAT CG4 LACTIC ACID, ED    Imaging Review Dg Chest Port 1 View  09/14/2015  CLINICAL DATA:  Fever and chest pain with vomiting for 2 days EXAM: PORTABLE  CHEST 1 VIEW COMPARISON:  February 16, 2015 FINDINGS: There is no edema or consolidation. Heart is borderline enlarged with pulmonary vascularity within normal limits. No adenopathy. No bone lesions. IMPRESSION: Borderline cardiac enlargement.  No edema or consolidation. Electronically Signed   By: Lowella Grip III M.D.   On: 09/14/2015 07:38   Ct Renal Stone Study  09/14/2015  CLINICAL DATA:  Fever and lower abdominal pain. Vomiting. Chronic renal failure EXAM: CT ABDOMEN AND PELVIS WITHOUT CONTRAST TECHNIQUE: Multidetector CT imaging of the abdomen and pelvis was performed following the standard protocol without oral or intravenous contrast material administration. COMPARISON:  January 15, 2013 FINDINGS: Lower chest: There is patchy bibasilar atelectasis. There is cardiomegaly. The pericardium is not appreciably thickened. Hepatobiliary: No focal liver lesions are identified on this noncontrast enhanced study. Gallbladder is absent. Common bile duct measures 10 mm. Upper normal given post cholecystectomy state. No biliary duct mass or calculus is evident on this noncontrast enhanced study. Pancreas: No pancreatic mass or inflammatory focus is evident. Spleen: No splenic lesions are evident. Spleen is prominent measuring 12.6 x 12.6 x 5.7 cm. Measured  splenic volume is 452 cubic cm. Adrenals/Urinary Tract: Adrenals appear normal bilaterally. Kidneys are somewhat atrophic with renal cortical thinning and scarring bilaterally. There is no renal mass or hydronephrosis on either side. There is no renal or ureteral calculus on either side. Urinary bladder is midline with wall thickness within normal limits. Stomach/Bowel: Rectum is distended with air and stool. There is no rectal wall thickening. Elsewhere, there is no bowel wall or mesenteric thickening. No bowel obstruction. No free air or portal venous air. Vascular/Lymphatic: There is no abdominal aortic aneurysm. No vascular lesions are evident on this noncontrast enhanced study. There is no appreciable adenopathy in the abdomen or pelvis. Reproductive: Uterus is retroverted. There is no pelvic mass or pelvic fluid collection. Other: The appendix appears normal. No ascites or abscess in the abdomen or pelvis. Musculoskeletal: There are no blastic or lytic bone lesions. There is no abdominal wall or intramuscular lesion. IMPRESSION: Renal scarring with cortical thinning bilaterally. No renal or ureteral calculus. No hydronephrosis on either side. No urinary bladder wall thickening. No bowel wall thickening or bowel obstruction. Rectum distended with stool and air without rectal wall thickening. No abscess.  Appendix appears normal. Gallbladder absent. Common bile duct upper normal for post cholecystectomy state. Spleen mildly prominent without focal lesion evident. Electronically Signed   By: Lowella Grip III M.D.   On: 09/14/2015 09:02   I have personally reviewed and evaluated these images and lab results as part of my medical decision-making.   EKG Interpretation   Date/Time:  Monday Sep 14 2015 06:11:41 EDT Ventricular Rate:  108 PR Interval:  144 QRS Duration: 64 QT Interval:  344 QTC Calculation: 460 R Axis:   45 Text Interpretation:  Sinus tachycardia Otherwise normal ECG No  significant  change since last tracing Confirmed by FLOYD MD, DANIEL  [id number]) on 09/14/2015 6:21:20 AM      MDM   Final diagnoses:  Persistent recurrent vomiting  ESRD on dialysis [facility])  Other specified fever   Patient presents with fever vomiting and abdominal discomfort. Discussed differential diagnosis including urine infection/ , viral, appendicitis or other etiology. Plan for blood work, IV fluids, sepsis screen and CT scan without contrast due to renal disease.  Patient had persistent vomiting despite Zofran. Heart rate mild improved on reassessment. No signs infection externally at fistula site in left arm. No definite source of  infection at this time. Rocephin and blood cultures ordered. Plan to discuss observation for recurrent vomiting and fever in a dialysis patient with the resident physician.  The patients results and plan were reviewed and discussed.   Any x-rays performed were independently reviewed by myself.   Differential diagnosis were considered with the presenting HPI.  Medications  0.9 %  sodium chloride infusion (not administered)  ondansetron (ZOFRAN) injection 4 mg (not administered)  sodium chloride 0.9 % bolus 1,000 mL (0 mLs Intravenous Stopped 09/14/15 0942)  ondansetron (ZOFRAN) injection 4 mg (4 mg Intravenous Given 09/14/15 0817)  HYDROmorphone (DILAUDID) injection 0.5 mg (0.5 mg Intravenous Given 09/14/15 0821)  acetaminophen (TYLENOL) suppository 650 mg (650 mg Rectal Given 09/14/15 0823)  cefTRIAXone (ROCEPHIN) 1 g in dextrose 5 % 50 mL IVPB (1 g Intravenous New Bag/Given 09/14/15 1035)  metoCLOPramide (REGLAN) injection 10 mg (10 mg Intravenous Given 09/14/15 1027)    Filed Vitals:   09/14/15 0930 09/14/15 1000 09/14/15 1030 09/14/15 1036  BP: 134/87 134/93 136/105   Pulse: 100 96 98   Temp:    99 F (37.2 C)  TempSrc:    Oral  Resp: 22 17 16    Height:      Weight:      SpO2: 90% 96% 97%     Final diagnoses:  Persistent recurrent vomiting  ESRD on dialysis  [facility])  Other specified fever    Admission/ observation were discussed with the admitting physician, patient and/or family and they are comfortable with the plan.    Elnora Morrison, MD 09/14/15 [phone]

## 2015-09-14 NOTE — ED Notes (Signed)
Attempted to call report

## 2015-09-15 DIAGNOSIS — A084 Viral intestinal infection, unspecified: Secondary | ICD-10-CM | POA: Diagnosis not present

## 2015-09-15 DIAGNOSIS — F411 Generalized anxiety disorder: Secondary | ICD-10-CM

## 2015-09-15 DIAGNOSIS — I120 Hypertensive chronic kidney disease with stage 5 chronic kidney disease or end stage renal disease: Secondary | ICD-10-CM | POA: Diagnosis not present

## 2015-09-15 DIAGNOSIS — Z992 Dependence on renal dialysis: Secondary | ICD-10-CM | POA: Diagnosis not present

## 2015-09-15 DIAGNOSIS — N186 End stage renal disease: Secondary | ICD-10-CM

## 2015-09-15 DIAGNOSIS — R1115 Cyclical vomiting syndrome unrelated to migraine: Secondary | ICD-10-CM | POA: Diagnosis present

## 2015-09-15 LAB — RENAL FUNCTION PANEL
Albumin: 2.8 g/dL — ABNORMAL LOW (ref 3.5–5.0)
Anion gap: 16 — ABNORMAL HIGH (ref 5–15)
BUN: 38 mg/dL — AB (ref 6–20)
CALCIUM: 9.4 mg/dL (ref 8.9–10.3)
CHLORIDE: 103 mmol/L (ref 101–111)
CO2: 22 mmol/L (ref 22–32)
CREATININE: 11.32 mg/dL — AB (ref 0.44–1.00)
GFR calc non Af Amer: 4 mL/min — ABNORMAL LOW (ref >60)
GFR, EST AFRICAN AMERICAN: 4 mL/min — AB (ref >60)
GLUCOSE: 130 mg/dL — AB (ref 65–99)
Phosphorus: 2.9 mg/dL (ref 2.5–4.6)
Potassium: 3.9 mmol/L (ref 3.5–5.1)
SODIUM: 141 mmol/L (ref 135–145)

## 2015-09-15 LAB — CBC
HCT: 29.2 % — ABNORMAL LOW (ref 36.0–46.0)
Hemoglobin: 9.1 g/dL — ABNORMAL LOW (ref 12.0–15.0)
MCH: 30.3 pg (ref 26.0–34.0)
MCHC: 31.2 g/dL (ref 30.0–36.0)
MCV: 97.3 fL (ref 78.0–100.0)
PLATELETS: 160 10*3/uL (ref 150–400)
RBC: 3 MIL/uL — AB (ref 3.87–5.11)
RDW: 16.9 % — ABNORMAL HIGH (ref 11.5–15.5)
WBC: 4.3 10*3/uL (ref 4.0–10.5)

## 2015-09-15 LAB — MRSA PCR SCREENING: MRSA BY PCR: NEGATIVE

## 2015-09-15 MED ORDER — HYDROMORPHONE HCL 1 MG/ML IJ SOLN
INTRAMUSCULAR | Status: AC
  Administered 2015-09-15: 0.5 mg via INTRAVENOUS
  Filled 2015-09-15: qty 1

## 2015-09-15 MED ORDER — PENTAFLUOROPROP-TETRAFLUOROETH EX AERO
INHALATION_SPRAY | CUTANEOUS | Status: AC
  Administered 2015-09-15: 15:00:00
  Filled 2015-09-15: qty 103.5

## 2015-09-15 MED ORDER — METOCLOPRAMIDE HCL 10 MG PO TABS: 10.0000 mg | ORAL_TABLET | Freq: Three times a day (TID) | ORAL | Status: DC | PRN

## 2015-09-15 MED ORDER — METOCLOPRAMIDE HCL 5 MG/ML IJ SOLN
10.0000 mg | Freq: Three times a day (TID) | INTRAMUSCULAR | Status: DC | PRN
  Administered 2015-09-15 (×2): 10 mg via INTRAVENOUS
  Filled 2015-09-15 (×2): qty 2

## 2015-09-15 MED ORDER — HYDROMORPHONE HCL 1 MG/ML IJ SOLN
0.5000 mg | Freq: Three times a day (TID) | INTRAMUSCULAR | Status: DC | PRN
  Administered 2015-09-15: 0.5 mg via INTRAVENOUS
  Filled 2015-09-15: qty 1

## 2015-09-15 NOTE — Progress Notes (Signed)
Subjective:  Threw up breakfast  Feel better now / fro HD todayt  Objective Vital signs in last 24 hours: Filed Vitals:   09/14/15 1206 09/14/15 2152 09/15/15 0641 09/15/15 0856  BP: 137/86 144/98 139/94   Pulse: 89 95 82   Temp: 98.6 F (37 C) 99.4 F (37.4 C) 98.4 F (36.9 C) 97.3 F (36.3 C)  TempSrc: Oral Oral Oral   Resp: 18 18 17    Height: 5\' 7"  (1.702 m)     Weight: 58.06 kg (128 lb)     SpO2: 97% 100% 98%    Weight change: -0.028 kg (-1 oz)  Physical Exam: General: alert thin WF NAD  Heart: RRR, no mur, rub or gallop Lungs: CTA Abdomen: BS pos , soft, nondistend ,Slighly tender epigastric, no rebound Extremities: No pedal edema Dialysis Access: Pos bruit LUA AVF   OP Dialysis Orders: Center: [facility] on TTS . EDW 60 HD Bath 2k. 2.25ca Time 3hr Heparin 2000. Access LUA AVF BFR 350 DFR AF 1.5  Mircera 75 mcg last on 5/02 q 2wks Units IV/HD Venofer 50mg  qweek  Other Op labs HGB 9.6 5/04 Ca 9.7 phos4.3 PTH 167  Problem/Plan: 1. Viral gastroenteritis- per admit team 2. ESRD - HD TTS HD  Today no uf  fu labs pre hd 3. Hypertension/volume - bp stable /  will keep even no UF sec to N/V/ .makes urine and usually less than 1luf [facility] /On 3 bp meds as opBetter  controlled bp with  Admit,/ ?? Element dehydration  Also /may need to  taper down in hosp. At op kid center bp mostly elevated ?? compliance as op with bp meds 4. Anemia Of ESRD- hgb 11.4 on esa / fe as op, hold for follow up hgb trend 5. Metabolic bone disease - Ca /phos stable as op No vit d on hd/ on renvela as binder 6. Generalized Anxiety Disorder- on meds per admit   Ernest Haber, PA-C [facility] [phone] 09/15/2015,12:43 PM    Labs: Basic Metabolic Panel:  Recent Labs Lab 09/14/15 0640  NA 140  K 4.4  CL 99*  CO2 23  GLUCOSE 102*  BUN 25*  CREATININE 8.91*  CALCIUM 10.3   Liver Function Tests:  Recent Labs Lab  09/14/15 0640  AST 26  ALT 17  ALKPHOS 53  BILITOT 1.0  PROT 6.6  ALBUMIN 3.6    Recent Labs Lab 09/14/15 0640  LIPASE 22   No results for input(s): AMMONIA in the last 168 hours. CBC:  Recent Labs Lab 09/14/15 0640  WBC 5.5  HGB 11.4*  HCT 35.6*  MCV 96.2  PLT 233   Cardiac Enzymes: No results for input(s): CKTOTAL, CKMB, CKMBINDEX, TROPONINI in the last 168 hours. CBG: No results for input(s): GLUCAP in the last 168 hours.  Studies/Results: Dg Chest Port 1 View  09/14/2015  CLINICAL DATA:  Fever and chest pain with vomiting for 2 days EXAM: PORTABLE CHEST 1 VIEW COMPARISON:  February 16, 2015 FINDINGS: There is no edema or consolidation. Heart is borderline enlarged with pulmonary vascularity within normal limits. No adenopathy. No bone lesions. IMPRESSION: Borderline cardiac enlargement.  No edema or consolidation. Electronically Signed   By: Lowella Grip III M.D.   On: 09/14/2015 07:38   Ct Renal Stone Study  09/14/2015  CLINICAL DATA:  Fever and lower abdominal pain. Vomiting. Chronic renal failure EXAM: CT ABDOMEN AND PELVIS WITHOUT CONTRAST TECHNIQUE: Multidetector CT imaging of the abdomen and  pelvis was performed following the standard protocol without oral or intravenous contrast material administration. COMPARISON:  January 15, 2013 FINDINGS: Lower chest: There is patchy bibasilar atelectasis. There is cardiomegaly. The pericardium is not appreciably thickened. Hepatobiliary: No focal liver lesions are identified on this noncontrast enhanced study. Gallbladder is absent. Common bile duct measures 10 mm. Upper normal given post cholecystectomy state. No biliary duct mass or calculus is evident on this noncontrast enhanced study. Pancreas: No pancreatic mass or inflammatory focus is evident. Spleen: No splenic lesions are evident. Spleen is prominent measuring 12.6 x 12.6 x 5.7 cm. Measured splenic volume is 452 cubic cm. Adrenals/Urinary Tract: Adrenals appear normal  bilaterally. Kidneys are somewhat atrophic with renal cortical thinning and scarring bilaterally. There is no renal mass or hydronephrosis on either side. There is no renal or ureteral calculus on either side. Urinary bladder is midline with wall thickness within normal limits. Stomach/Bowel: Rectum is distended with air and stool. There is no rectal wall thickening. Elsewhere, there is no bowel wall or mesenteric thickening. No bowel obstruction. No free air or portal venous air. Vascular/Lymphatic: There is no abdominal aortic aneurysm. No vascular lesions are evident on this noncontrast enhanced study. There is no appreciable adenopathy in the abdomen or pelvis. Reproductive: Uterus is retroverted. There is no pelvic mass or pelvic fluid collection. Other: The appendix appears normal. No ascites or abscess in the abdomen or pelvis. Musculoskeletal: There are no blastic or lytic bone lesions. There is no abdominal wall or intramuscular lesion. IMPRESSION: Renal scarring with cortical thinning bilaterally. No renal or ureteral calculus. No hydronephrosis on either side. No urinary bladder wall thickening. No bowel wall thickening or bowel obstruction. Rectum distended with stool and air without rectal wall thickening. No abscess.  Appendix appears normal. Gallbladder absent. Common bile duct upper normal for post cholecystectomy state. Spleen mildly prominent without focal lesion evident. Electronically Signed   By: Lowella Grip III M.D.   On: 09/14/2015 09:02   Medications:   . carvedilol  25 mg Oral BID WC  . cloNIDine HCl  0.1 mg Oral BID  . docusate sodium  100 mg Oral BID  . feeding supplement (NEPRO CARB STEADY)  237 mL Oral BID BM  . heparin  5,000 Units Subcutaneous Q8H  . hydrALAZINE  25 mg Oral BID  . multivitamin  1 tablet Oral QHS  . sevelamer carbonate  1,600 mg Oral TID WC  . sodium chloride flush  3 mL Intravenous Q12H

## 2015-09-15 NOTE — Progress Notes (Signed)
Nutrition Brief Note  Patient identified on the Malnutrition Screening Tool (MST) Report  Wt Readings from Last 15 Encounters:  09/14/15 128 lb (58.06 kg)  04/08/15 148 lb 9.6 oz (67.405 kg)  03/11/15 151 lb 4.8 oz (68.629 kg)  03/06/15 150 lb (68.04 kg)  01/27/15 154 lb (69.854 kg)  01/23/15 154 lb (69.854 kg)  01/14/15 158 lb (71.668 kg)  01/13/15 157 lb (71.215 kg)  10/07/14 176 lb 6.4 oz (80.015 kg)  02/09/14 170 lb (77.111 kg)   Valerie Yates is a 40 year old with HTN complicated by ESRD on TTS HD, as well as chronic anxiety who presents with nausea, vomiting, abdominal pain and subjective fevers that started yesterday.   Pt admitted with viral gastroenteritis.   Pt reports poor appetite for the past 3-4 days as a result of nausea and vomiting. Prior to this, she reports good appetite, consuming 3 meals daily with snacks. She confirms hx of distant wt loss, related to ESRD. She also shares with this RD that she worked with a "very strict RD", who prescribed her diet diet which may her lose a significant amount of weight, "but my kidney doctor took me off that".   Pt reports her UBW is around 130# and has typically been petite and small framed her entire life. Confirmed with outpatient HD records that dry wt is 60 kg.   Pt denies consuming nutritional supplements at home. Both pt and RN confirm refusal of Nepro. She politely declined offer of additional supplements.   Nutrition-Focused physical exam completed. Findings are no fat depletion, no muscle depletion, and no edema.   Case discussed with RN.  Body mass index is 20.04 kg/(m^2). Patient meets criteria for normal weight range based on current BMI.   Current diet order is renal with 1200 ml fluid restriction, patient is consuming approximately n/a% of meals at this time. Labs and medications reviewed.   No nutrition interventions warranted at this time. If nutrition issues arise, please consult RD.   Valerie Yates A. Jimmye Norman, RD,  LDN, CDE Pager: [phone] After hours Pager: [phone]

## 2015-09-15 NOTE — Care Management Obs Status (Signed)
[facility] NOTIFICATION   Patient Details  Name: Valerie Yates MRN: [id number] Date of Birth: 30-Sep-1975   Medicare Observation Status Notification Given:  Yes (Viral gastroenteritis )    Marilu Favre, RN 09/15/2015, 10:02 AM

## 2015-09-15 NOTE — Progress Notes (Signed)
Subjective: Valerie Yates continued to have nausea/vomiting and abdominal pain overnight. These were well-relieved with IV reglan and dilaudid. When she is able to take her home oxycodone, it also helps, but her N/V has prevented this at times. She started having some loose brown BMs, roughly 6 times since admission, which is new. She endorses subjective fevers but no objective fevers have been recorded. She denies any other new symptoms, including any cough, SOB, or urinary symptoms.  Objective: Vital signs in last 24 hours: Filed Vitals:   09/14/15 1206 09/14/15 2152 09/15/15 0641 09/15/15 0856  BP: 137/86 144/98 139/94   Pulse: 89 95 82   Temp: 98.6 F (37 C) 99.4 F (37.4 C) 98.4 F (36.9 C) 97.3 F (36.3 C)  TempSrc: Oral Oral Oral   Resp: 18 18 17    Height: 5\' 7"  (1.702 m)     Weight: 128 lb (58.06 kg)     SpO2: 97% 100% 98%    Weight change: -1 oz (-0.028 kg)  Intake/Output Summary (Last 24 hours) at 09/15/15 0916 Last data filed at 09/14/15 2046  Gross per 24 hour  Intake    360 ml  Output      1 ml  Net    359 ml     Gen: Well-appearing, alert and oriented to person, place, and time CV: Normal rate, regular rhythm, no murmurs, rubs, or gallops Pulmonary: Normal effort, CTA bilaterally, no crackles or wheezes Abdominal: No further. point tenderness in the subxiphoid region. Moderate tenderness to palpation in the right lower quadrant without rebound, guarding. Also with moderate RUQ tenderness. Bowel sounds heard. Murphy's sign negative. No hepatosplenomegaly Extremities: Distal pulses 2+ in upper and lower extremities bilaterally, no tenderness, erythema or edema Neuro: CN II-XII grossly intact, no focal weakness or sensory deficits noted  Lab Results: Basic Metabolic Panel:  Recent Labs Lab 09/14/15 0640  NA 140  K 4.4  CL 99*  CO2 23  GLUCOSE 102*  BUN 25*  CREATININE 8.91*  CALCIUM 10.3   Liver Function Tests:  Recent Labs Lab 09/14/15 0640  AST 26   ALT 17  ALKPHOS 53  BILITOT 1.0  PROT 6.6  ALBUMIN 3.6    Recent Labs Lab 09/14/15 0640  LIPASE 22   CBC:  Recent Labs Lab 09/14/15 0640  WBC 5.5  HGB 11.4*  HCT 35.6*  MCV 96.2  PLT 233   Urinalysis:  Recent Labs Lab 09/14/15 0630  COLORURINE YELLOW  LABSPEC 1.009  PHURINE 8.5*  GLUCOSEU 100*  HGBUR SMALL*  BILIRUBINUR NEGATIVE  KETONESUR NEGATIVE  PROTEINUR 100*  NITRITE NEGATIVE  LEUKOCYTESUR NEGATIVE   Studies/Results: Dg Chest Port 1 View  09/14/2015  CLINICAL DATA:  Fever and chest pain with vomiting for 2 days EXAM: PORTABLE CHEST 1 VIEW COMPARISON:  February 16, 2015 FINDINGS: There is no edema or consolidation. Heart is borderline enlarged with pulmonary vascularity within normal limits. No adenopathy. No bone lesions. IMPRESSION: Borderline cardiac enlargement.  No edema or consolidation. Electronically Signed   By: Lowella Grip III M.D.   On: 09/14/2015 07:38   Ct Renal Stone Study  09/14/2015  CLINICAL DATA:  Fever and lower abdominal pain. Vomiting. Chronic renal failure EXAM: CT ABDOMEN AND PELVIS WITHOUT CONTRAST TECHNIQUE: Multidetector CT imaging of the abdomen and pelvis was performed following the standard protocol without oral or intravenous contrast material administration. COMPARISON:  January 15, 2013 FINDINGS: Lower chest: There is patchy bibasilar atelectasis. There is cardiomegaly. The pericardium is not appreciably  thickened. Hepatobiliary: No focal liver lesions are identified on this noncontrast enhanced study. Gallbladder is absent. Common bile duct measures 10 mm. Upper normal given post cholecystectomy state. No biliary duct mass or calculus is evident on this noncontrast enhanced study. Pancreas: No pancreatic mass or inflammatory focus is evident. Spleen: No splenic lesions are evident. Spleen is prominent measuring 12.6 x 12.6 x 5.7 cm. Measured splenic volume is 452 cubic cm. Adrenals/Urinary Tract: Adrenals appear normal  bilaterally. Kidneys are somewhat atrophic with renal cortical thinning and scarring bilaterally. There is no renal mass or hydronephrosis on either side. There is no renal or ureteral calculus on either side. Urinary bladder is midline with wall thickness within normal limits. Stomach/Bowel: Rectum is distended with air and stool. There is no rectal wall thickening. Elsewhere, there is no bowel wall or mesenteric thickening. No bowel obstruction. No free air or portal venous air. Vascular/Lymphatic: There is no abdominal aortic aneurysm. No vascular lesions are evident on this noncontrast enhanced study. There is no appreciable adenopathy in the abdomen or pelvis. Reproductive: Uterus is retroverted. There is no pelvic mass or pelvic fluid collection. Other: The appendix appears normal. No ascites or abscess in the abdomen or pelvis. Musculoskeletal: There are no blastic or lytic bone lesions. There is no abdominal wall or intramuscular lesion. IMPRESSION: Renal scarring with cortical thinning bilaterally. No renal or ureteral calculus. No hydronephrosis on either side. No urinary bladder wall thickening. No bowel wall thickening or bowel obstruction. Rectum distended with stool and air without rectal wall thickening. No abscess.  Appendix appears normal. Gallbladder absent. Common bile duct upper normal for post cholecystectomy state. Spleen mildly prominent without focal lesion evident. Electronically Signed   By: Lowella Grip III M.D.   On: 09/14/2015 09:02   Assessment/Plan: 1. Viral gastroenteritis - patient with nausea, vomiting, and abdominal pain as well as subjective fevers for the past 2 days. Here, patient is afebrile with mild tachycardia but otherwise hemodynamically stable. Patient is not uremic and does not have a leukocytosis. UA is dirty with few bacteria, few squames, but nitrite negative and in the setting of ESRD without any urinary symptoms, this is likely not a UTI. CT abdomen pelvis  in the ED shows no identifiable cause for her symptoms. Patient with crampy right lower quadrant pain, but exam and CT not suggestive of appendicitis. Patient with surgically removed gallbladder, ovaries, and uterus approximately 10 years ago. Presentation most likely viral gastroenteritis, however, adhesions may be a less likely but possible alternative given her multiple abdominal surgeries ~10-15 years ago. Still with persistent symptoms, slowly improving. -Reglan 10mg  PO or IV q 8 hours (Zofran d/c'ed as not helping) -Oxycodone IR 5mg  PO q 12 hours PRN for breakthrough pain; Dilaudid 0.5mg  IV q8 PRN for breakthrough when not tolerating PO -Acetaminophen 1000 mg every 6 hours as needed -Renal diet for now; advance diet as tolerated -We'll hold off on an additional IV fluids for now -Patient without any other signs or symptoms suggestive of flu at this time, so will defer testing at this time  2. 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 Last went to dialysis on Saturday. Currently undergoing transplant evaluation at [facility] home hydralazine, clonidine, and carvedilol -Continue home sevelamer -HD as scheduled today  Dispo: Disposition is deferred at this time, awaiting improvement of current medical problems.  Anticipated discharge in approximately 1 day(s).   The patient does have a current  PCP (Enid Skeens, MD) and does need an [facility] hospital follow-up appointment after discharge.  The patient does not have transportation limitations that hinder transportation to clinic appointments.    Norval Gable, MD 09/15/2015, 9:16 AM

## 2015-09-16 DIAGNOSIS — A084 Viral intestinal infection, unspecified: Secondary | ICD-10-CM | POA: Diagnosis not present

## 2015-09-16 DIAGNOSIS — I10 Essential (primary) hypertension: Secondary | ICD-10-CM | POA: Diagnosis present

## 2015-09-16 LAB — URINE CULTURE: Culture: NO GROWTH

## 2015-09-16 MED ORDER — OXYCODONE HCL 5 MG PO TABS: 5.0000 mg | ORAL_TABLET | Freq: Two times a day (BID) | ORAL | Status: DC | PRN

## 2015-09-16 MED ORDER — METOCLOPRAMIDE HCL 10 MG PO TABS: 10.0000 mg | ORAL_TABLET | Freq: Three times a day (TID) | ORAL | Status: DC | PRN

## 2015-09-16 NOTE — Progress Notes (Signed)
Subjective: Valerie Yates feels much better with a lighter diet with only intermittent nausea but no vomiting or diarrhea. She still has some cramping abdominal pain in her RLQ which is much improved from the last couple of days. She denies any other abdominal pain. She tolerated dialysis well yesterday. She wants to go home.  Objective: Vital signs in last 24 hours: Filed Vitals:   09/15/15 1844 09/15/15 2157 09/16/15 0200 09/16/15 0556  BP: 139/84 103/70 100/64 136/78  Pulse: 92 80 81 88  Temp: 98.8 F (37.1 C) 98.3 F (36.8 C) 98.2 F (36.8 C) 97.9 F (36.6 C)  TempSrc: Oral Oral Oral Oral  Resp: 18 20 18 19   Height:      Weight:      SpO2: 99% 97% 98% 99%   Weight change: -9.2 oz (-0.261 kg)  Intake/Output Summary (Last 24 hours) at 09/16/15 0947 Last data filed at 09/15/15 1712  Gross per 24 hour  Intake      0 ml  Output    -79 ml  Net     79 ml     Gen: Well-appearing, alert and oriented to person, place, and time CV: Normal rate, regular rhythm, no murmurs, rubs, or gallops Pulmonary: Normal effort, CTA bilaterally, no crackles or wheezes Abdominal: No further. point tenderness in the subxiphoid region. Moderate tenderness to palpation in the right lower quadrant without rebound, guarding. No further RUQ tenderness. Bowel sounds heard. Murphy's sign negative. No hepatosplenomegaly Extremities: Distal pulses 2+ in upper and lower extremities bilaterally, no tenderness, erythema or edema Neuro: CN II-XII grossly intact, no focal weakness or sensory deficits noted  Lab Results: Basic Metabolic Panel:  Recent Labs Lab 09/14/15 0640 09/15/15 1609  NA 140 141  K 4.4 3.9  CL 99* 103  CO2 23 22  GLUCOSE 102* 130*  BUN 25* 38*  CREATININE 8.91* 11.32*  CALCIUM 10.3 9.4  PHOS  --  2.9   Liver Function Tests:  Recent Labs Lab 09/14/15 0640 09/15/15 1609  AST 26  --   ALT 17  --   ALKPHOS 53  --   BILITOT 1.0  --   PROT 6.6  --   ALBUMIN 3.6 2.8*     Recent Labs Lab 09/14/15 0640  LIPASE 22   CBC:  Recent Labs Lab 09/14/15 0640 09/15/15 1610  WBC 5.5 4.3  HGB 11.4* 9.1*  HCT 35.6* 29.2*  MCV 96.2 97.3  PLT 233 160   Urinalysis:  Recent Labs Lab 09/14/15 0630  COLORURINE YELLOW  LABSPEC 1.009  PHURINE 8.5*  GLUCOSEU 100*  HGBUR SMALL*  BILIRUBINUR NEGATIVE  KETONESUR NEGATIVE  PROTEINUR 100*  NITRITE NEGATIVE  LEUKOCYTESUR NEGATIVE   Studies/Results: No results found. Assessment/Plan: 1. Viral gastroenteritis - patient with nausea, vomiting, and abdominal pain as well as subjective fevers for the past 2 days. Here, patient is afebrile with mild tachycardia but otherwise hemodynamically stable. Patient is not uremic and does not have a leukocytosis. UA is dirty with few bacteria, few squames, but nitrite negative and in the setting of ESRD without any urinary symptoms, this is likely not a UTI. CT abdomen pelvis in the ED shows no identifiable cause for her symptoms. Patient with crampy right lower quadrant pain, but exam and CT not suggestive of appendicitis. Patient with surgically removed gallbladder, ovaries, and uterus approximately 10 years ago. Presentation most likely viral gastroenteritis, however, adhesions may be a less likely but possible alternative given her multiple abdominal surgeries ~10-15 years  ago. Still with persistent symptoms, slowly improving. -Reglan 10mg  PO or IV q 8 hours (Zofran d/c'ed as not helping) -Oxycodone IR 5mg  PO q 12 hours PRN for breakthrough pain; Dilaudid 0.5mg  IV q8 PRN for breakthrough when not tolerating PO -Acetaminophen 1000 mg every 6 hours as needed -Renal diet for now; advance diet as tolerated -We'll hold off on an additional IV fluids for now -Patient without any other signs or symptoms suggestive of flu at this time, so will defer testing at this time  2. 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 Last went to  dialysis on Saturday. Currently undergoing transplant evaluation at [place] home hydralazine, clonidine, and carvedilol -Continue home sevelamer -HD as scheduled yesterday  Dispo: Disposition is deferred at this time, awaiting improvement of current medical problems.  Anticipated discharge today.  The patient does have a current PCP (Enid Skeens, MD) and does need an [facility] hospital follow-up appointment after discharge.  The patient does not have transportation limitations that hinder transportation to clinic appointments.    Norval Gable, MD 09/16/2015, 9:47 AM

## 2015-09-16 NOTE — Discharge Summary (Signed)
Name: Valerie Yates MRN: [id number] DOB: 12/12/1975 40 y.o. PCP: Enid Skeens, MD  Date of Admission: 09/14/2015  6:20 AM Date of Discharge: 09/16/2015 Attending Physician: No att. providers found  Discharge Diagnosis: 1. Viral gastroenteritis  Active Problems:   Fever   Gastroenteritis   ESRD on dialysis (HCC)   Persistent recurrent vomiting   HTN (hypertension), benign  Discharge Medications:   Medication List    TAKE these medications        ALPRAZolam 0.5 MG tablet  Commonly known as:  XANAX  Take 0.5 mg by mouth 3 (three) times daily as needed for anxiety.     carvedilol 25 MG tablet  Commonly known as:  COREG  Take 25 mg by mouth 2 (two) times daily with a meal.     cloNIDine HCl 0.1 MG Tb12 ER tablet  Commonly known as:  KAPVAY  Take 0.1 mg by mouth 2 (two) times daily.     hydrALAZINE 25 MG tablet  Commonly known as:  APRESOLINE  Take 25 mg by mouth 2 (two) times daily.     metoCLOPramide 10 MG tablet  Commonly known as:  REGLAN  Take 1 tablet (10 mg total) by mouth every 8 (eight) hours as needed for nausea.     ondansetron 4 MG tablet  Commonly known as:  ZOFRAN  Take 4 mg by mouth every 8 (eight) hours as needed for nausea or vomiting.     oxyCODONE 5 MG immediate release tablet  Commonly known as:  Oxy IR/ROXICODONE  Take 5 mg by mouth 2 (two) times daily.     oxyCODONE 5 MG immediate release tablet  Commonly known as:  Oxy IR/ROXICODONE  Take 1 tablet (5 mg total) by mouth 2 (two) times daily as needed for severe pain or breakthrough pain.     RENVELA 800 MG tablet  Generic drug:  sevelamer carbonate  Take 1,600 mg by mouth 3 (three) times daily before meals.        Disposition and follow-up:   Ms.Valerie Yates was discharged from [facility] in Good condition.  At the hospital follow up visit please address:  1.  Is she tolerating a regular diet now (or still on BRAT/clears diet)? Resolution of her abdominal pain?  2.   Labs / imaging needed at time of follow-up: None  3.  Pending labs/ test needing follow-up: None  Follow-up Appointments:     Follow-up Information    Schedule an appointment as soon as possible for a visit with Enid Skeens., MD.   Specialty:  Family Medicine   Why:  As needed   Contact information:   [address] [place] [address] [phone]       Discharge Instructions: Discharge Instructions    Increase activity slowly    Complete by:  As directed            Consultations: Treatment Team:  Edrick Oh, MD  Procedures Performed:  Dg Chest Port 1 View  09/14/2015  CLINICAL DATA:  Fever and chest pain with vomiting for 2 days EXAM: PORTABLE CHEST 1 VIEW COMPARISON:  February 16, 2015 FINDINGS: There is no edema or consolidation. Heart is borderline enlarged with pulmonary vascularity within normal limits. No adenopathy. No bone lesions. IMPRESSION: Borderline cardiac enlargement.  No edema or consolidation. Electronically Signed   By: Lowella Grip III M.D.   On: 09/14/2015 07:38   Ct Renal Stone Study  09/14/2015  CLINICAL DATA:  Fever  and lower abdominal pain. Vomiting. Chronic renal failure EXAM: CT ABDOMEN AND PELVIS WITHOUT CONTRAST TECHNIQUE: Multidetector CT imaging of the abdomen and pelvis was performed following the standard protocol without oral or intravenous contrast material administration. COMPARISON:  January 15, 2013 FINDINGS: Lower chest: There is patchy bibasilar atelectasis. There is cardiomegaly. The pericardium is not appreciably thickened. Hepatobiliary: No focal liver lesions are identified on this noncontrast enhanced study. Gallbladder is absent. Common bile duct measures 10 mm. Upper normal given post cholecystectomy state. No biliary duct mass or calculus is evident on this noncontrast enhanced study. Pancreas: No pancreatic mass or inflammatory focus is evident. Spleen: No splenic lesions are evident. Spleen is prominent measuring 12.6 x  12.6 x 5.7 cm. Measured splenic volume is 452 cubic cm. Adrenals/Urinary Tract: Adrenals appear normal bilaterally. Kidneys are somewhat atrophic with renal cortical thinning and scarring bilaterally. There is no renal mass or hydronephrosis on either side. There is no renal or ureteral calculus on either side. Urinary bladder is midline with wall thickness within normal limits. Stomach/Bowel: Rectum is distended with air and stool. There is no rectal wall thickening. Elsewhere, there is no bowel wall or mesenteric thickening. No bowel obstruction. No free air or portal venous air. Vascular/Lymphatic: There is no abdominal aortic aneurysm. No vascular lesions are evident on this noncontrast enhanced study. There is no appreciable adenopathy in the abdomen or pelvis. Reproductive: Uterus is retroverted. There is no pelvic mass or pelvic fluid collection. Other: The appendix appears normal. No ascites or abscess in the abdomen or pelvis. Musculoskeletal: There are no blastic or lytic bone lesions. There is no abdominal wall or intramuscular lesion. IMPRESSION: Renal scarring with cortical thinning bilaterally. No renal or ureteral calculus. No hydronephrosis on either side. No urinary bladder wall thickening. No bowel wall thickening or bowel obstruction. Rectum distended with stool and air without rectal wall thickening. No abscess.  Appendix appears normal. Gallbladder absent. Common bile duct upper normal for post cholecystectomy state. Spleen mildly prominent without focal lesion evident. Electronically Signed   By: Lowella Grip III M.D.   On: 09/14/2015 09:02    2D Echo: None  Cardiac Cath: None  Admission HPI: Valerie Yates is a 40 year old with HTN complicated by ESRD on TTS HD, as well as chronic anxiety who presents with nausea, vomiting, abdominal pain and subjective fevers that started yesterday. She felt well when going to dialysis on Saturday, with only a slight generalized headache that is  common after her sessions. However, starting yesterday she started to have nausea and NBNB vomiting as well as subjective fevers. After multiple episodes of vomiting, she also started noticing sharp abdominal pain in the subxiphoid region that is nonradiating as well as intermittent squeezing abdominal pain in her right lower quadrant that is nonradiating. Her PCP gave her Zofran, which did not help and prompted her to come into the ED. She has never had these symptoms before. She denies other symptoms, including chest pain, palpitations, cough, any urinary frequency, urgency, dysuria, change in odor, diarrhea (she has chronic constipation, last bowel movement 2 days ago), or vaginal discharge. She denies any recent travel or sick contacts. She is a nonsmoker, denies any alcohol abuse, or any other recreational drug use. She is compliant with all of her medications. She has no other concerns at this time.  Hospital Course by problem list: Active Problems:   Fever   Gastroenteritis   ESRD on dialysis ([facility])   Persistent recurrent vomiting   HTN (hypertension), benign  1. Viral gastroenteritis  - the patient initially presented with nausea, vomiting, right lower quadrant abdominal pain and subjective fevers and did develop diarrhea upon admission, although she did not have symptoms on initial assessment. Her CT abdomen and pelvis was negative for any acute abdominal pathology with changes from her prior cholecystectomy and hysterectomy, with normal-appearing appendix. The patient remained afebrile without a leukocytosis throughout her admission. Her symptoms improved greatly over the next 2 days with as needed antiemetics and analgesics as well as a clear diet, initially requiring IV administration. When she was able to tolerate a more substantial diet and tolerate oral medications, she was discharged with instructions to slowly advance her diet as well as short courses of as needed antiemetics and  analgesics and to follow-up with her PCP as needed  2. ESRD on TTS HD - nephrology was consulted while the patient was admitted and the patient received dialysis on Tuesday, 09/15/2015 as scheduled, which she tolerated well.  Discharge Vitals:   BP 126/87 mmHg  Pulse 78  Temp(Src) 97.8 F (36.6 C) (Oral)  Resp 18  Ht 5\' 7"  (1.702 m)  Wt 127 lb 10.3 oz (57.9 kg)  BMI 19.99 kg/m2  SpO2 99%  Discharge Labs:  Results for orders placed or performed during the hospital encounter of 09/14/15 (from the past 24 hour(s))  Renal function panel     Status: Abnormal   Collection Time: 09/15/15  4:09 PM  Result Value Ref Range   Sodium 141 135 - 145 mmol/L   Potassium 3.9 3.5 - 5.1 mmol/L   Chloride 103 101 - 111 mmol/L   CO2 22 22 - 32 mmol/L   Glucose, Bld 130 (H) 65 - 99 mg/dL   BUN 38 (H) 6 - 20 mg/dL   Creatinine, Ser 11.32 (H) 0.44 - 1.00 mg/dL   Calcium 9.4 8.9 - 10.3 mg/dL   Phosphorus 2.9 2.5 - 4.6 mg/dL   Albumin 2.8 (L) 3.5 - 5.0 g/dL   GFR calc non Af Amer 4 (L) >60 mL/min   GFR calc Af Amer 4 (L) >60 mL/min   Anion gap 16 (H) 5 - 15  CBC     Status: Abnormal   Collection Time: 09/15/15  4:10 PM  Result Value Ref Range   WBC 4.3 4.0 - 10.5 K/uL   RBC 3.00 (L) 3.87 - 5.11 MIL/uL   Hemoglobin 9.1 (L) 12.0 - 15.0 g/dL   HCT 29.2 (L) 36.0 - 46.0 %   MCV 97.3 78.0 - 100.0 fL   MCH 30.3 26.0 - 34.0 pg   MCHC 31.2 30.0 - 36.0 g/dL   RDW 16.9 (H) 11.5 - 15.5 %   Platelets 160 150 - 400 K/uL    Signed: Norval Gable, MD 09/16/2015, 3:26 PM    Services Ordered on Discharge: None Equipment Ordered on Discharge: None

## 2015-09-16 NOTE — Progress Notes (Signed)
[facility] ROUNDING NOTE   Subjective:   Interval History: Hoping to go home  Feels much better this am  Objective:  Vital signs in last 24 hours:  Temp:  [97.6 F (36.4 C)-98.8 F (37.1 C)] 97.9 F (36.6 C) (05/10 0556) Pulse Rate:  [79-96] 88 (05/10 0556) Resp:  [18-20] 19 (05/10 0556) BP: (100-157)/(58-101) 136/78 mmHg (05/10 0556) SpO2:  [97 %-99 %] 99 % (05/10 0556) Weight:  [57.8 kg (127 lb 6.8 oz)-57.9 kg (127 lb 10.3 oz)] 57.9 kg (127 lb 10.3 oz) (05/09 1712)  Weight change: -0.261 kg (-9.2 oz) Filed Weights   09/14/15 1206 09/15/15 1421 09/15/15 1712  Weight: 58.06 kg (128 lb) 57.8 kg (127 lb 6.8 oz) 57.9 kg (127 lb 10.3 oz)    Intake/Output: I/O last 3 completed shifts: In: -  Out: -78 [Emesis/NG output:1]   Intake/Output this shift:     General: alert thin WF NAD  Heart: RRR, no mur, rub or gallop Lungs: CTA Abdomen: BS pos , soft, nondistend ,Slighly tender epigastric, no rebound Extremities: No pedal edema Dialysis Access: Pos bruit LUA AVF  OP Dialysis Orders: Center: [facility] on TTS . EDW 60 HD Bath 2k. 2.25ca Time 3hr Heparin 2000. Access LUA AVF BFR 350 DFR AF 1.5  Mircera 75 mcg last on 5/02 q 2wks Units IV/HD Venofer 50mg  qweek    Basic Metabolic Panel:  Recent Labs Lab 09/14/15 0640 09/15/15 1609  NA 140 141  K 4.4 3.9  CL 99* 103  CO2 23 22  GLUCOSE 102* 130*  BUN 25* 38*  CREATININE 8.91* 11.32*  CALCIUM 10.3 9.4  PHOS  --  2.9    Liver Function Tests:  Recent Labs Lab 09/14/15 0640 09/15/15 1609  AST 26  --   ALT 17  --   ALKPHOS 53  --   BILITOT 1.0  --   PROT 6.6  --   ALBUMIN 3.6 2.8*    Recent Labs Lab 09/14/15 0640  LIPASE 22   No results for input(s): AMMONIA in the last 168 hours.  CBC:  Recent Labs Lab 09/14/15 0640 09/15/15 1610  WBC 5.5 4.3  HGB 11.4* 9.1*  HCT 35.6* 29.2*  MCV 96.2 97.3  PLT 233 160    Cardiac Enzymes: No results for input(s):  CKTOTAL, CKMB, CKMBINDEX, TROPONINI in the last 168 hours.  BNP: Invalid input(s): POCBNP  CBG: No results for input(s): GLUCAP in the last 168 hours.  Microbiology: Results for orders placed or performed during the hospital encounter of 09/14/15  Culture, blood (Routine X 2) w Reflex to ID Panel     Status: None (Preliminary result)   Collection Time: 09/14/15  7:49 AM  Result Value Ref Range Status   Specimen Description BLOOD RIGHT HAND  Final   Special Requests BOTTLES DRAWN AEROBIC AND ANAEROBIC 5CC  Final   Culture NO GROWTH 1 DAY  Final   Report Status PENDING  Incomplete  Blood culture (routine x 2)     Status: None (Preliminary result)   Collection Time: 09/14/15  9:47 AM  Result Value Ref Range Status   Specimen Description BLOOD RIGHT HAND  Final   Special Requests BOTTLES DRAWN AEROBIC AND ANAEROBIC 5CC  Final   Culture NO GROWTH 1 DAY  Final   Report Status PENDING  Incomplete  MRSA PCR Screening     Status: None   Collection Time: 09/14/15  9:24 PM  Result Value Ref Range Status   MRSA by  PCR NEGATIVE NEGATIVE Final    Comment:        The GeneXpert MRSA Assay (FDA approved for NASAL specimens only), is one component of a comprehensive MRSA colonization surveillance program. It is not intended to diagnose MRSA infection nor to guide or monitor treatment for MRSA infections.     Coagulation Studies: No results for input(s): LABPROT, INR in the last 72 hours.  Urinalysis:  Recent Labs  09/14/15 0630  COLORURINE YELLOW  LABSPEC 1.009  PHURINE 8.5*  GLUCOSEU 100*  HGBUR SMALL*  BILIRUBINUR NEGATIVE  KETONESUR NEGATIVE  PROTEINUR 100*  NITRITE NEGATIVE  LEUKOCYTESUR NEGATIVE      Imaging: No results found.   Medications:     . carvedilol  25 mg Oral BID WC  . cloNIDine HCl  0.1 mg Oral BID  . docusate sodium  100 mg Oral BID  . heparin  5,000 Units Subcutaneous Q8H  . hydrALAZINE  25 mg Oral BID  . multivitamin  1 tablet Oral QHS  .  sevelamer carbonate  1,600 mg Oral TID WC  . sodium chloride flush  3 mL Intravenous Q12H   sodium chloride, acetaminophen, ALPRAZolam, HYDROmorphone (DILAUDID) injection, metoCLOPramide (REGLAN) injection **OR** metoCLOPramide, oxyCODONE, sodium chloride flush  Assessment/ Plan:  1. Viral gastroenteritis- per admit team  CT negative 2. ESRD - HD TTS HD Today no uf fu labs pre hd 3. Hypertension/volume - bp stable / will keep even no UF sec to N/V/ .makes urine and usually less than 1luf [facility] /On 3 bp meds as opBetter  controlled bp with Admit,/ ?? Element dehydration Also /may need to taper down in hosp. At op kid center bp mostly elevated ?? 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 Metabolic bone disease - Ca 9.4  Phos 2.9  Alb 2.9   No vit d on hd/ on renvela as binder 6. Generalized Anxiety Disorder- on meds per admit   LOS:  Manuel Dall W @TODAY @9 :03 AM

## 2015-09-16 NOTE — Discharge Instructions (Signed)
Mrs. Groskopf,  Dennis Bast should continue to feel less nausea and pain over the next few days. The most important thing at this time is to stick with a light diet, mostly things like soups at this time. You can also try a BRAT diet (bananas-rice-applesauce-toast) and see if that works. If your pain or nausea is bad, I have prescribed some Reglan as needed and some extra of your home pain medicine to get you through the next few days. Follow-up with your primary doctor as needed.   I'm happy you're starting to feel better!  Blane Ohara

## 2015-09-19 LAB — CULTURE, BLOOD (ROUTINE X 2)
CULTURE: NO GROWTH
Culture: NO GROWTH

## 2015-10-27 HISTORY — PX: TRANSTHORACIC ECHOCARDIOGRAM: SHX275

## 2015-10-28 ENCOUNTER — Telehealth: Payer: Self-pay | Admitting: Cardiology

## 2015-10-28 NOTE — Telephone Encounter (Signed)
Received records from [facility] for appointment on 10/29/15 with Dr Percival Spanish.  Records given to [facility] (medical records) for Dr Hochrein's schedule on 10/29/15. lp

## 2015-10-28 NOTE — Progress Notes (Signed)
No show  This encounter was created in error - please disregard.

## 2015-10-29 ENCOUNTER — Encounter: Payer: Medicare Other | Admitting: Cardiology

## 2015-10-29 ENCOUNTER — Encounter: Payer: Self-pay | Admitting: *Deleted

## 2015-11-11 ENCOUNTER — Other Ambulatory Visit: Payer: Self-pay | Admitting: Nephrology

## 2015-11-11 DIAGNOSIS — R109 Unspecified abdominal pain: Secondary | ICD-10-CM

## 2015-11-16 ENCOUNTER — Observation Stay (HOSPITAL_COMMUNITY)
Admission: EM | Admit: 2015-11-16 | Discharge: 2015-11-17 | Disposition: A | Discharge status: Home / Self Care | Payer: Medicare Other | Attending: Internal Medicine | Admitting: Internal Medicine

## 2015-11-16 ENCOUNTER — Emergency Department (HOSPITAL_COMMUNITY): Payer: Medicare Other

## 2015-11-16 ENCOUNTER — Encounter (HOSPITAL_COMMUNITY): Payer: Self-pay

## 2015-11-16 DIAGNOSIS — E440 Moderate protein-calorie malnutrition: Secondary | ICD-10-CM | POA: Insufficient documentation

## 2015-11-16 DIAGNOSIS — R109 Unspecified abdominal pain: Secondary | ICD-10-CM

## 2015-11-16 DIAGNOSIS — I120 Hypertensive chronic kidney disease with stage 5 chronic kidney disease or end stage renal disease: Secondary | ICD-10-CM | POA: Diagnosis not present

## 2015-11-16 DIAGNOSIS — D509 Iron deficiency anemia, unspecified: Secondary | ICD-10-CM | POA: Diagnosis not present

## 2015-11-16 DIAGNOSIS — J90 Pleural effusion, not elsewhere classified: Secondary | ICD-10-CM | POA: Diagnosis not present

## 2015-11-16 DIAGNOSIS — E8770 Fluid overload, unspecified: Secondary | ICD-10-CM | POA: Diagnosis not present

## 2015-11-16 DIAGNOSIS — Z880 Allergy status to penicillin: Secondary | ICD-10-CM | POA: Diagnosis not present

## 2015-11-16 DIAGNOSIS — K219 Gastro-esophageal reflux disease without esophagitis: Secondary | ICD-10-CM | POA: Insufficient documentation

## 2015-11-16 DIAGNOSIS — Z79899 Other long term (current) drug therapy: Secondary | ICD-10-CM | POA: Insufficient documentation

## 2015-11-16 DIAGNOSIS — N186 End stage renal disease: Secondary | ICD-10-CM

## 2015-11-16 DIAGNOSIS — I10 Essential (primary) hypertension: Secondary | ICD-10-CM | POA: Diagnosis present

## 2015-11-16 DIAGNOSIS — R112 Nausea with vomiting, unspecified: Secondary | ICD-10-CM | POA: Diagnosis present

## 2015-11-16 DIAGNOSIS — G43909 Migraine, unspecified, not intractable, without status migrainosus: Secondary | ICD-10-CM | POA: Insufficient documentation

## 2015-11-16 DIAGNOSIS — Z681 Body mass index (BMI) 19 or less, adult: Secondary | ICD-10-CM | POA: Insufficient documentation

## 2015-11-16 DIAGNOSIS — F419 Anxiety disorder, unspecified: Secondary | ICD-10-CM | POA: Diagnosis not present

## 2015-11-16 DIAGNOSIS — R0600 Dyspnea, unspecified: Secondary | ICD-10-CM | POA: Diagnosis present

## 2015-11-16 DIAGNOSIS — Z881 Allergy status to other antibiotic agents status: Secondary | ICD-10-CM | POA: Insufficient documentation

## 2015-11-16 DIAGNOSIS — Z888 Allergy status to other drugs, medicaments and biological substances status: Secondary | ICD-10-CM | POA: Diagnosis not present

## 2015-11-16 DIAGNOSIS — N2581 Secondary hyperparathyroidism of renal origin: Secondary | ICD-10-CM | POA: Insufficient documentation

## 2015-11-16 DIAGNOSIS — Z992 Dependence on renal dialysis: Secondary | ICD-10-CM | POA: Diagnosis not present

## 2015-11-16 DIAGNOSIS — Z885 Allergy status to narcotic agent status: Secondary | ICD-10-CM | POA: Diagnosis not present

## 2015-11-16 LAB — CBC
HEMATOCRIT: 39.3 % (ref 36.0–46.0)
Hemoglobin: 12.2 g/dL (ref 12.0–15.0)
MCH: 32.2 pg (ref 26.0–34.0)
MCHC: 31 g/dL (ref 30.0–36.0)
MCV: 103.7 fL — ABNORMAL HIGH (ref 78.0–100.0)
Platelets: 294 10*3/uL (ref 150–400)
RBC: 3.79 MIL/uL — ABNORMAL LOW (ref 3.87–5.11)
RDW: 15.6 % — AB (ref 11.5–15.5)
WBC: 6.4 10*3/uL (ref 4.0–10.5)

## 2015-11-16 LAB — BASIC METABOLIC PANEL
Anion gap: 15 (ref 5–15)
BUN: 28 mg/dL — AB (ref 6–20)
CALCIUM: 9.5 mg/dL (ref 8.9–10.3)
CO2: 22 mmol/L (ref 22–32)
Chloride: 101 mmol/L (ref 101–111)
Creatinine, Ser: 9.81 mg/dL — ABNORMAL HIGH (ref 0.44–1.00)
GFR calc Af Amer: 5 mL/min — ABNORMAL LOW (ref >60)
GFR, EST NON AFRICAN AMERICAN: 4 mL/min — AB (ref >60)
GLUCOSE: 91 mg/dL (ref 65–99)
Potassium: 5 mmol/L (ref 3.5–5.1)
Sodium: 138 mmol/L (ref 135–145)

## 2015-11-16 LAB — HEPATIC FUNCTION PANEL
ALT: 11 U/L — ABNORMAL LOW (ref 14–54)
AST: 17 U/L (ref 15–41)
Albumin: 3.5 g/dL (ref 3.5–5.0)
Alkaline Phosphatase: 62 U/L (ref 38–126)
BILIRUBIN DIRECT: 0.1 mg/dL (ref 0.1–0.5)
Indirect Bilirubin: 0.8 mg/dL (ref 0.3–0.9)
TOTAL PROTEIN: 6.3 g/dL — AB (ref 6.5–8.1)
Total Bilirubin: 0.9 mg/dL (ref 0.3–1.2)

## 2015-11-16 LAB — VITAMIN B12: VITAMIN B 12: 554 pg/mL (ref 180–914)

## 2015-11-16 LAB — SEDIMENTATION RATE: Sed Rate: 15 mm/hr (ref 0–22)

## 2015-11-16 LAB — I-STAT TROPONIN, ED: Troponin i, poc: 0 ng/mL (ref 0.00–0.08)

## 2015-11-16 LAB — TSH: TSH: 1.396 u[IU]/mL (ref 0.350–4.500)

## 2015-11-16 LAB — BRAIN NATRIURETIC PEPTIDE: B Natriuretic Peptide: 2745.1 pg/mL — ABNORMAL HIGH (ref 0.0–100.0)

## 2015-11-16 LAB — C-REACTIVE PROTEIN: CRP: 2 mg/dL — AB (ref <1.0)

## 2015-11-16 LAB — MRSA PCR SCREENING: MRSA by PCR: NEGATIVE

## 2015-11-16 LAB — LIPASE, BLOOD: LIPASE: 21 U/L (ref 11–51)

## 2015-11-16 MED ORDER — CLONIDINE HCL 0.3 MG/24HR TD PTWK: 0.3000 mg | MEDICATED_PATCH | TRANSDERMAL | Status: DC

## 2015-11-16 MED ORDER — NA FERRIC GLUC CPLX IN SUCROSE 12.5 MG/ML IV SOLN
125.0000 mg | INTRAVENOUS | Status: DC
Start: 2015-11-18 — End: 2015-11-17
  Filled 2015-11-16: qty 10

## 2015-11-16 MED ORDER — ONDANSETRON HCL 4 MG/2ML IJ SOLN
4.0000 mg | Freq: Once | INTRAMUSCULAR | Status: AC
  Administered 2015-11-16: 4 mg via INTRAVENOUS
  Filled 2015-11-16: qty 2

## 2015-11-16 MED ORDER — HEPARIN SODIUM (PORCINE) 5000 UNIT/ML IJ SOLN
5000.0000 [IU] | Freq: Three times a day (TID) | INTRAMUSCULAR | Status: DC
  Administered 2015-11-16 – 2015-11-17 (×3): 5000 [IU] via SUBCUTANEOUS
  Filled 2015-11-16 (×3): qty 1

## 2015-11-16 MED ORDER — FENTANYL CITRATE (PF) 100 MCG/2ML IJ SOLN
50.0000 ug | Freq: Once | INTRAMUSCULAR | Status: AC
  Administered 2015-11-16: 50 ug via INTRAVENOUS
  Filled 2015-11-16: qty 2

## 2015-11-16 MED ORDER — ALPRAZOLAM 0.5 MG PO TABS
ORAL_TABLET | ORAL | Status: AC
  Filled 2015-11-16: qty 1

## 2015-11-16 MED ORDER — ONDANSETRON HCL 4 MG/2ML IJ SOLN
4.0000 mg | Freq: Three times a day (TID) | INTRAMUSCULAR | Status: AC | PRN
Start: 2015-11-16 — End: 2015-11-17

## 2015-11-16 MED ORDER — ACETAMINOPHEN 500 MG PO TABS
1000.0000 mg | ORAL_TABLET | Freq: Four times a day (QID) | ORAL | Status: DC | PRN
Start: 2015-11-16 — End: 2015-11-16

## 2015-11-16 MED ORDER — SEVELAMER CARBONATE 800 MG PO TABS
1600.0000 mg | ORAL_TABLET | Freq: Three times a day (TID) | ORAL | Status: DC
  Administered 2015-11-16 – 2015-11-17 (×4): 1600 mg via ORAL
  Filled 2015-11-16 (×4): qty 2

## 2015-11-16 MED ORDER — CARVEDILOL 25 MG PO TABS
37.5000 mg | ORAL_TABLET | Freq: Two times a day (BID) | ORAL | Status: DC
  Administered 2015-11-16 – 2015-11-17 (×2): 37.5 mg via ORAL
  Filled 2015-11-16 (×2): qty 1

## 2015-11-16 MED ORDER — ALPRAZOLAM 0.5 MG PO TABS
0.5000 mg | ORAL_TABLET | Freq: Three times a day (TID) | ORAL | Status: DC | PRN
  Administered 2015-11-16 (×2): 0.5 mg via ORAL
  Filled 2015-11-16: qty 1

## 2015-11-16 MED ORDER — HYDROCODONE-ACETAMINOPHEN 5-325 MG PO TABS
1.0000 | ORAL_TABLET | ORAL | Status: DC | PRN
  Administered 2015-11-16 – 2015-11-17 (×4): 1 via ORAL
  Filled 2015-11-16 (×4): qty 1

## 2015-11-16 MED ORDER — METOCLOPRAMIDE HCL 10 MG PO TABS
10.0000 mg | ORAL_TABLET | Freq: Three times a day (TID) | ORAL | Status: DC | PRN
  Administered 2015-11-17: 10 mg via ORAL
  Filled 2015-11-16: qty 1

## 2015-11-16 NOTE — H&P (Signed)
Date: 11/16/2015               Patient Name:  Valerie Yates MRN: [id number]  DOB: 1976-03-03 Age / Sex: 40 y.o., female   PCP: Enid Skeens, MD         Medical Service: Internal Medicine Teaching Service         Attending Physician: Dr. Bartholomew Crews, MD    First Contact: Dr. Philipp Ovens Pager: [phone]  Second Contact: Dr. Randell Patient Pager: [phone]       After Hours (After 5p/  First Contact Pager: [phone]  weekends / holidays): Second Contact Pager: [phone]   Chief Complaint: shortness of breath  History of Present Illness: Valerie Yates is a 40 yo female with PMHx of ESRD (unknown cause) on HD MWF and HTN who presented to the ED with shortness of breath. Patient states that one month ago she developed worsening shortness of breath, DOE, orthopnea and swelling in her ankles. She was first evaluated by her PCP who was concerned for bronchitis and gave her an albuterol inhaler without relief. She was later seen by her nephrologist at her HD session who felt her symptoms were due to volume overload despite being compliant with her medication and her HD sessions. They worked to increase fluid removal and establish her at a new dry weight of 58 kg. Her respiratory symptoms improved after HD. Over the last one week, patient states she has developed recurring shortness of breath, orthopnea, DOE, decreased appetite and malaise. She feels more swollen in her cheeks than normal. She also has recurrent pain located over her lower left rib cage which can switch sides to the right side. It is worse with inspiration, but persists all the time. This morning, patient developed nausea with dry heaves with progressive shortness of breath. Patient reports compliance with her medications and with her HD sessions. She is below her dry weight.   She denies fever, chills, headache, chest pain, cough, wheezing, abdominal pain, diarrhea, dysuria, recent illness or swelling in her legs.   In the ED, patient was  afebrile, hypertensive at 159/131, HR 93, RR 22, satting 99% on room air. BMET showed elevated BUN/Creatinine at 28/9.81 in the setting of ESRD, but normal electrolytes. CBC shows macrocytic mild anemia at 12.2 above baseline. BNP elevated at 2,445.1. CXR with small bilateral pleural effusions with bilateral atelectasis versus infiltrate. Troponin negative. EKG NSR with TWI in I, aVL, V5 and V6 which is new since May 2017, but evident on EKG from June 2017.   Meds: Current Facility-Administered Medications  Medication Dose Route Frequency Provider Last Rate Last Dose  . acetaminophen (TYLENOL) tablet 1,000 mg  1,000 mg Oral Q6H PRN Alexa Angela Burke, MD      . ALPRAZolam Duanne Moron) tablet 0.5 mg  0.5 mg Oral TID PRN Florinda Marker, MD   0.5 mg at 11/16/15 1341  . carvedilol (COREG) tablet 37.5 mg  37.5 mg Oral BID Alexa Angela Burke, MD      . Derrill Memo ON 11/21/2015] cloNIDine (CATAPRES - Dosed in mg/24 hr) patch 0.3 mg  0.3 mg Transdermal Weekly Alexa R Burns, MD      . heparin injection 5,000 Units  5,000 Units Subcutaneous Q8H Alexa R Burns, MD      . metoCLOPramide (REGLAN) tablet 10 mg  10 mg Oral Q8H PRN Alexa R Burns, MD      . ondansetron (ZOFRAN) injection 4 mg  4 mg Intravenous Q8H PRN Carmin Muskrat,  MD      . sevelamer carbonate (RENVELA) tablet 1,600 mg  1,600 mg Oral TID WC Alexa Angela Burke, MD        Allergies: Allergies as of 11/16/2015 - Review Complete 11/16/2015  Allergen Reaction Noted  . Azithromycin Anaphylaxis and Rash 04/08/2015  . Erythromycin Hives, Shortness Of Breath, and Rash 03/26/2014  . Penicillins Hives and Shortness Of Breath 02/09/2014  . Morphine sulfate Rash 03/26/2014   Past Medical History  Diagnosis Date  . Hypertension   . 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 UTI (lower urinary tract infection)     recurrent  . Hyperparathyroidism due to renal insufficiency ([place])   . Shortness of breath dyspnea     with exertion  .  Anxiety   . Complication of anesthesia     woke up before they took the endotra. tube, once was slow to wake up  . ESRD (end stage renal disease) on dialysis [facility])     "TTS; [facility]" (09/14/2015)  . Iron deficiency anemia   . GERD (gastroesophageal reflux disease)   . Headache     "after dialysis" (09/14/2015)  . Migraine     "a few/year": (09/14/2015)    Family History:  Grandmother: T2DM Grandfather: Heart Disease  Social History:  Tobacco Use: None Alcohol [id number] Illicit Drug Use: None  Review of Systems: A complete ROS was negative except as per HPI.   Physical Exam: Filed Vitals:   11/16/15 1115 11/16/15 1243 11/16/15 1320 11/16/15 1327  BP: 158/112 149/112 153/110 156/115  Pulse: 86 86 90 89  Temp:  98.2 F (36.8 C) 98.4 F (36.9 C)   TempSrc:   Oral   Resp: 26 18 26 24   Height:      Weight:   126 lb 5.2 oz (57.3 kg)   SpO2: 99% 100% 100%    General: Vital signs reviewed.  Patient is well-developed and well-nourished, in no acute distress and cooperative with exam.  Head: Normocephalic and atraumatic. Eyes: PERRLA, no scleral icterus.  Mouth: Normal oropharynx. Neck: Supple, trachea midline, no carotid bruit present.  Cardiovascular: RRR, S1 normal, S2 normal, 3/6 blowing systolic murmur best auscultated in LUSB. Pulmonary/Chest: Inspiratory crackles in bilateral lower lung fields, no wheezes or rhonchi. No accessory muscle use, speaking in full sentences. Abdominal: Soft, non-tender, non-distended, BS +.  Musculoskeletal: LUE AVF with good thrill and bruit. Extremities: No lower extremity edema bilaterally, pulses symmetric and intact bilaterally.  Neurological: A&O x3 Skin: Warm, dry and intact. Telangiectasias on upper back. Psychiatric: Normal mood and affect. speech and behavior is normal. Cognition and memory are normal.   EKG: EKG NSR with TWI in I, aVL, V5 and V6 which is new since May 2017, but evident on EKG from June 2017.   CXR: CXR with small  bilateral pleural effusions with bilateral atelectasis versus infiltrate.  Assessment & Plan by Problem: Principal Problem:   Volume overload Active Problems:   ESRD on dialysis ([place])   HTN (hypertension), benign   Nausea with vomiting  Ms. Stigler is a 40 yo female with PMHx of ESRD (unknown cause) on HD MWF and HTN who presented to the ED with shortness of breath.   Volume Overload: Patient presents with progressive shortness of breath, DOE and orthopnea over the past one week. This is similar to her presentation of volume overload one month ago which was relieved by HD. Nephrology has been working to adjust her dry weight. She  is currently below her dry of 58 kg and is 57.3 kg on presentation. Physical exam reveals bilateral rales, but no lower extremity edema or JVD. BNP elevated at 2,445.1. CXR with small bilateral pleural effusions with bilateral atelectasis versus infiltrate. Patient is new to HD in the last 6 months and her fluctuations in symptoms may be secondary to adjusting to HD as she has been compliant with HD, her diet and with her medications. Her ESRD has no known cause per patient. Patient is stable on room air with plans to dialyze today. Will treat symptomatically for nausea and vomiting.   -Admit to inpatient -Renal Diet -Repeat CBC/BMET tomorrow am -Nephrology following, appreciate recommendations -Reglan 10 mg Q8H prn (per patient this works for her nausea not zofran) -Would ambulate with pulse ox tomorrow -Daily weights -Continue renvela TID  HTN: Hypertensive on admission at 159/131. She reports compliance with her medications. BP is normally well controlled at home in the 130s/80s. She occasionally has to take an additional clonidine twice a week for elevated BP.  -Continue home medications of clonidine 0.3 mg patch Qweek (change Saturday) -Continue home Carvedilol 37.5 mg BID -Hold prn clonidine  Anxiety: Patient takes Xanax 0.5 mg TID prn for anxiety. -Continue  Xanax as above  Systolic Murmur: Patient has a 3/6 systolic murmur at LUSB on exam. She reports this was first noted several months ago. She has undergone TTE and is unsure of the results. Her PCP has reviewed them. No TTE in our chart or care everywhere. -Consider contacting PCP for records   EKG Changes: EKG shows TWI in leads I, aVL, V4 and V5. New on comparison to May 2017, but similar to June 2017. She denies any chest pain. Troponin negative.  -Follow clinically  DVT/PE ppx: Heparin TID FEN: Renal Code: FULL  Dispo: Admit patient to observation, expected stay less than two midnights.  Signed: Martyn Malay, DO PGY-3 Internal Medicine Resident Pager # [phone] 11/16/2015 2:18 PM

## 2015-11-16 NOTE — ED Notes (Signed)
Admitting MD at bedside.

## 2015-11-16 NOTE — Consult Note (Deleted)
[facility] Renal Consultation Note    Indication for Consultation:  Management of ESRD/hemodialysis; anemia, hypertension/volume and secondary hyperparathyroidism PCP:  HPI: Valerie Yates is a 40 y.o. female with ESRD on MWF HD at [facility], difficult to control HTN who presents with SOB, DOE and pain in left side going into stomach that hurts with breathing. She has been having her EDW lowered and BP meds increased.  Since her EDW being lowered to 58, she has still been leaving below edw as low as 57.1.  Average net UF volumes have only been 0.1 - 0.7 the past five treatments. Her post HD BP her last treatment on 7/7 was 176/112 sitting and 162/104 standing with post EDW wt of 57.4 and net UF only 0.1 kg.  She denies LE edema recently, she has not been eating or drinking well. She has some nausea.  She denies diarrhea, fever, dysuria. She makes a fair about of uirine..  She is prone to cramping and doesn't think she needs longer on dialysis because her labs are good and Dr. Mercy Moore says she has good clearances. She tells me the ED doc told her her left sided pain is due to pleurisy. She is compliant with dialysis treatments  Past Medical History  Diagnosis Date  . Hypertension   . 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 UTI (lower urinary tract infection)     recurrent  . Hyperparathyroidism due to renal insufficiency ([place])   . Shortness of breath dyspnea     with exertion  . Anxiety   . Complication of anesthesia     woke up before they took the endotra. tube, once was slow to wake up  . ESRD (end stage renal disease) on dialysis [facility])     "TTS; [facility]" (09/14/2015)  . Iron deficiency anemia   . GERD (gastroesophageal reflux disease)   . Headache     "after dialysis" (09/14/2015)  . Migraine     "a few/year": (09/14/2015)   Past Surgical History  Procedure Laterality Date  . Abdominal hysterectomy  2006  . Tubal ligation  2003  .  Shoulder arthroscopy w/ rotator cuff repair Left 2012  . Av fistula placement Left 01/27/2015    Procedure:  BASILIC VEIN TRANSPOSITION ;  Surgeon: Angelia Mould, MD;  Location: [place];  Service: Vascular;  Laterality: Left;  . Laparoscopic cholecystectomy  2005  . Dilation and curettage of uterus     Family History  Problem Relation Age of Onset  . Cancer Mother     Breast  . Diabetes Maternal Grandmother   . Heart disease Maternal Grandfather    Social History:  reports that she has never smoked. She has never used smokeless tobacco. She reports that she does not drink alcohol or use illicit drugs. Allergies  Allergen Reactions  . Azithromycin Anaphylaxis and Rash  . Erythromycin Hives, Shortness Of Breath and Rash    Acne Aid-Dermatological.  . Penicillins Hives and Shortness Of Breath  . Morphine Sulfate Rash    Concentrate-Analgesics-Opioid    Prior to Admission medications   Medication Sig Start Date End Date Taking? Authorizing Provider  acetaminophen (TYLENOL) 500 MG tablet Take 1,000 mg by mouth every 6 (six) hours as needed for mild pain.   Yes Historical Provider, MD  ALPRAZolam Duanne Moron) 0.5 MG tablet Take 0.5 mg by mouth 3 (three) times daily as needed for anxiety.   Yes Historical Provider, MD  carvedilol (COREG)  25 MG tablet Take 1.5 tablets by mouth 2 (two) times daily. 10/26/15  Yes Historical Provider, MD  cloNIDine (CATAPRES - DOSED IN MG/24 HR) 0.3 mg/24hr patch Place 1 patch onto the skin once a week. 11/04/15  Yes Historical Provider, MD  cloNIDine HCl (KAPVAY) 0.1 MG TB12 ER tablet Take 0.1 mg by mouth as needed (For blood pressure >166).    Yes Historical Provider, MD  metoCLOPramide (REGLAN) 10 MG tablet Take 1 tablet (10 mg total) by mouth every 8 (eight) hours as needed for nausea. 09/16/15  Yes Norval Gable, MD  ondansetron (ZOFRAN) 4 MG tablet Take 4 mg by mouth every 8 (eight) hours as needed for nausea or vomiting.   Yes Historical Provider, MD   PROAIR HFA 108 (90 Base) MCG/ACT inhaler Inhale 2 puffs into the lungs as needed. wheezing 11/03/15  Yes Historical Provider, MD  RENVELA 800 MG tablet Take 1,600 mg by mouth 3 (three) times daily before meals. 09/07/15  Yes Historical Provider, MD  oxyCODONE (OXY IR/ROXICODONE) 5 MG immediate release tablet Take 1 tablet (5 mg total) by mouth 2 (two) times daily as needed for severe pain or breakthrough pain. 09/16/15   Norval Gable, MD   Current Facility-Administered Medications  Medication Dose Route Frequency Provider Last Rate Last Dose  . acetaminophen (TYLENOL) tablet 1,000 mg  1,000 mg Oral Q6H PRN Alexa Angela Burke, MD      . ALPRAZolam (XANAX) 0.5 MG tablet           . ALPRAZolam Duanne Moron) tablet 0.5 mg  0.5 mg Oral TID PRN Florinda Marker, MD   0.5 mg at 11/16/15 1341  . carvedilol (COREG) tablet 37.5 mg  37.5 mg Oral BID Alexa Angela Burke, MD      . Derrill Memo ON 11/21/2015] cloNIDine (CATAPRES - Dosed in mg/24 hr) patch 0.3 mg  0.3 mg Transdermal Weekly Alexa Angela Burke, MD      . heparin injection 5,000 Units  5,000 Units Subcutaneous Q8H Alexa R Burns, MD      . metoCLOPramide (REGLAN) tablet 10 mg  10 mg Oral Q8H PRN Alexa Angela Burke, MD      . ondansetron (ZOFRAN) injection 4 mg  4 mg Intravenous Q8H PRN Carmin Muskrat, MD      . sevelamer carbonate (RENVELA) tablet 1,600 mg  1,600 mg Oral TID WC Alexa Angela Burke, MD       Labs: Basic Metabolic Panel:  Recent Labs Lab 11/16/15 0846  NA 138  K 5.0  CL 101  CO2 22  GLUCOSE 91  BUN 28*  CREATININE 9.81*  CALCIUM 9.5   CBC:  Recent Labs Lab 11/16/15 0846  WBC 6.4  HGB 12.2  HCT 39.3  MCV 103.7*  PLT 294   Studies/Results: Dg Chest 2 View  11/16/2015  CLINICAL DATA:  Shortness of Breath, pain with deep respiration EXAM: CHEST  2 VIEW COMPARISON:  09/14/2015 FINDINGS: Cardiomegaly again noted. Small bilateral pleural effusion with bilateral basilar atelectasis or infiltrate. No pulmonary edema. Bony thorax is unremarkable.  IMPRESSION: No pulmonary edema. Small bilateral pleural effusion with bilateral basilar atelectasis or infiltrate. Electronically Signed   By: Lahoma Crocker M.D.   On: 11/16/2015 08:21    ROS: As per HPI otherwise negative.   Physical Exam: Filed Vitals:   11/16/15 1045 11/16/15 1115 11/16/15 1243 11/16/15 1320  BP: 155/115 158/112 149/112 153/110  Pulse: 87 86 86 90  Temp:   98.2 F (36.8 C) 98.4 F (  36.9 C)  TempSrc:    Oral  Resp: 25 26 18 26   Height:      Weight:    57.3 kg (126 lb 5.2 oz)  SpO2: 97% 99% 100% 100%     General:  Petite WF NAD breathing easily on room air Head: Normocephalic, atraumatic, sclera non-icteric, mucus membranes are moist Neck: Supple. JVD not elevated. Lungs: dim bases no  wheezes, rales, or rhonchi.  Heart: RRR with S1 S2. No murmurs, rubs, or gallops appreciated. Abdomen: Soft, tender on palation of left side  non-distended with normoactive bowel sounds.  M-S:  Strength and tone appear normal for age. Lower extremities:without edema or ischemic changes, no open wounds  Neuro: Alert and oriented X 3. Moves all extremities spontaneously. Psych: somewhat anxious affect Dialysis Access: left upper AVF + bruit buttonholes   Dialysis Orders: [facility] MWF 3 hr EDW 58 -gradually being lowered-leaving as low as 57.1 the past 2 weeks; 180 350//600 15 gauge 2 K 2.25 Ca left upper AVF heparin 2000 venofer through 7/24 mircera 50 q 2 - last 7/5 Recent labs: hgb 11 26% sat iPTH 167 Ca/P ok   Assessment/Plan: 1. SOB - small pleural effusions on HD, not much volume on exam.  Does need some volume down to help with BP control - will lower as tolerated. 2. ESRD -  MWF - HD today per routine - pre HD wt 57.3 - set goal for 1.5 - Kinetics generally are good at 3 hours but were suboptimal on Friday for some unknown reason as she ran her usual BFR - AF stable - labs ok - no change - K 5 3. Hypertension/volume  - gradually lowering edw; 6/28 clonidine patch increased  to 0.3 with prn clonidine and coreg 37.5 bid.  4. Anemia  - hgb 12.2 - continue Fe repletion- no ESA 5. Metabolic bone disease -  No VDRA needed/ on renvela 2 ac 6. Nutrition - renal diet/vit/ ongoing unintentional weight loss- not sure why 7. Anxiety -   Myriam Jacobson, [place] [phone] 11/16/2015, 2:02 PM   Pt seen, examined and agree w A/P as above. Needs w/u for progressive wt loss w anorexia, chronic nausea +/- diarrhea.  Has lost 60 lbs since starting dialysis and is continuing to lose weight.  Also history of chronic migraines, multiple miscarriages(4)  and iron deficiency anemia (fe def dates back to childhood, pre-menstrual). Have d/w primary team.  Kelly Splinter MD [facility] pager [phone]    cell [phone] 11/16/2015, 3:20 PM

## 2015-11-16 NOTE — ED Notes (Signed)
MD at bedside. 

## 2015-11-16 NOTE — Procedures (Signed)
  I was present at this dialysis session, have reviewed the session itself and made  appropriate changes Kelly Splinter MD [place] pager [phone]    cell [phone] 11/16/2015, 3:31 PM

## 2015-11-16 NOTE — ED Provider Notes (Signed)
CSN: [id number]     Arrival date & time 11/16/15  [id number] History   First MD Initiated Contact with Patient 11/16/15 [phone]     Chief Complaint  Patient presents with  . Shortness of Breath  . Emesis     (Consider location/radiation/quality/duration/timing/severity/associated sxs/prior Treatment) HPI Patient presents with concern of dyspnea, nausea, vomiting, generalized pain. Symptoms began about 2 days ago, has become worse the past 24 hours. No focal pain that is persistent. There is persistent generalized discomfort, as well as weakness. No clear alleviating or exacerbating factors. Patient has been intolerant of oral intake for the past 24 hours secondary to nausea, vomiting. Patient is a notable history of end-stage renal disease, is on dialysis. Last dialysis session was 3 days ago. Patient was scheduled for dialysis today, feels too unwell to attend her session.  Past Medical History  Diagnosis Date  . Hypertension   . 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 UTI (lower urinary tract infection)     recurrent  . Hyperparathyroidism due to renal insufficiency ([place])   . Shortness of breath dyspnea     with exertion  . Anxiety   . Complication of anesthesia     woke up before they took the endotra. tube, once was slow to wake up  . ESRD (end stage renal disease) on dialysis [facility])     "TTS; [place]" (09/14/2015)  . Iron deficiency anemia   . GERD (gastroesophageal reflux disease)   . Headache     "after dialysis" (09/14/2015)  . Migraine     "a few/year": (09/14/2015)   Past Surgical History  Procedure Laterality Date  . Abdominal hysterectomy  2006  . Tubal ligation  2003  . Shoulder arthroscopy w/ rotator cuff repair Left 2012  . Av fistula placement Left 01/27/2015    Procedure:  BASILIC VEIN TRANSPOSITION ;  Surgeon: Angelia Mould, MD;  Location: [place];  Service: Vascular;  Laterality: Left;  . Laparoscopic cholecystectomy   2005  . Dilation and curettage of uterus     Family History  Problem Relation Age of Onset  . Cancer Mother     Breast   Social History  Substance Use Topics  . Smoking status: Never Smoker   . Smokeless tobacco: Never Used  . Alcohol Use: No   OB History    No data available     Review of Systems  Constitutional:       Per HPI, otherwise negative  HENT:       Per HPI, otherwise negative  Respiratory:       Per HPI, otherwise negative  Cardiovascular:       Per HPI, otherwise negative  Gastrointestinal: Negative for vomiting.  Endocrine:       Negative aside from HPI  Genitourinary:       Neg aside from HPI   Musculoskeletal:       Per HPI, otherwise negative  Skin: Negative.   Allergic/Immunologic: Positive for immunocompromised state.  Neurological: Negative for syncope.      Allergies  Azithromycin; Erythromycin; Penicillins; and Morphine sulfate  Home Medications   Prior to Admission medications   Medication Sig Start Date End Date Taking? Authorizing Provider  ALPRAZolam Duanne Moron) 0.5 MG tablet Take 0.5 mg by mouth 3 (three) times daily as needed for anxiety.    Historical Provider, MD  carvedilol (COREG) 25 MG tablet Take 25 mg by mouth 2 (two) times daily with a meal.  Historical Provider, MD  cloNIDine HCl (KAPVAY) 0.1 MG TB12 ER tablet Take 0.1 mg by mouth 2 (two) times daily.     Historical Provider, MD  hydrALAZINE (APRESOLINE) 25 MG tablet Take 25 mg by mouth 2 (two) times daily. 09/09/15   Historical Provider, MD  metoCLOPramide (REGLAN) 10 MG tablet Take 1 tablet (10 mg total) by mouth every 8 (eight) hours as needed for nausea. 09/16/15   Norval Gable, MD  ondansetron (ZOFRAN) 4 MG tablet Take 4 mg by mouth every 8 (eight) hours as needed for nausea or vomiting.    Historical Provider, MD  oxyCODONE (OXY IR/ROXICODONE) 5 MG immediate release tablet Take 5 mg by mouth 2 (two) times daily. 09/09/15   Historical Provider, MD  oxyCODONE (OXY  IR/ROXICODONE) 5 MG immediate release tablet Take 1 tablet (5 mg total) by mouth 2 (two) times daily as needed for severe pain or breakthrough pain. 09/16/15   Norval Gable, MD  RENVELA 800 MG tablet Take 1,600 mg by mouth 3 (three) times daily before meals. 09/07/15   Historical Provider, MD   BP 159/131 mmHg  Pulse 93  Temp(Src) 98.8 F (37.1 C) (Oral)  Resp 22  Ht 5\' 7"  (1.702 m)  Wt 125 lb (56.7 kg)  BMI 19.57 kg/m2  SpO2 99% Physical Exam  Constitutional: She is oriented to person, place, and time.  Uncomfortable appearing young female awake and alert, answering questions appropriately  HENT:  Head: Normocephalic and atraumatic.  Eyes: Conjunctivae and EOM are normal.  Cardiovascular: Normal rate and regular rhythm.   Pulmonary/Chest: No stridor. She has decreased breath sounds.  Abdominal: She exhibits no distension.    Musculoskeletal: She exhibits no edema.  Neurological: She is alert and oriented to person, place, and time. No cranial nerve deficit.  Skin: Skin is warm and dry.     Psychiatric: She has a normal mood and affect.  Nursing note and vitals reviewed.   ED Course  Procedures (including critical care time) Labs Review Labs Reviewed  BASIC METABOLIC PANEL - Abnormal; Notable for the following:    BUN 28 (*)    Creatinine, Ser 9.81 (*)    GFR calc non Af Amer 4 (*)    GFR calc Af Amer 5 (*)    All other components within normal limits  CBC - Abnormal; Notable for the following:    RBC 3.79 (*)    MCV 103.7 (*)    RDW 15.6 (*)    All other components within normal limits  BRAIN NATRIURETIC PEPTIDE - Abnormal; Notable for the following:    B Natriuretic Peptide 2745.1 (*)    All other components within normal limits  I-STAT TROPOININ, ED    Imaging Review Dg Chest 2 View  11/16/2015  CLINICAL DATA:  Shortness of Breath, pain with deep respiration EXAM: CHEST  2 VIEW COMPARISON:  09/14/2015 FINDINGS: Cardiomegaly again noted. Small bilateral  pleural effusion with bilateral basilar atelectasis or infiltrate. No pulmonary edema. Bony thorax is unremarkable. IMPRESSION: No pulmonary edema. Small bilateral pleural effusion with bilateral basilar atelectasis or infiltrate. Electronically Signed   By: Lahoma Crocker M.D.   On: 11/16/2015 08:21   I have personally reviewed and evaluated these images and lab results as part of my medical decision-making.   EKG Interpretation   Date/Time:  Monday November 16 2015 07:31:24 EDT Ventricular Rate:  92 PR Interval:  142 QRS Duration: 68 QT Interval:  380 QTC Calculation: 469 R Axis:  89 Text Interpretation:  Sinus rhythm T wave abnormality Abnormal ekg  Confirmed by Carmin Muskrat  MD ([id number]) on 11/16/2015 9:45:00 AM     11:02 AM I discussed the patient's case with our nephrology team for dialysis today. On repeat exam the patient remained nauseous, though with no new pain.  Also discussed the patient's case with our internal medicine team for admission.  MDM  Young dialysis dependent female presents with nausea, vomiting, dyspnea. Here the patient is awake and alert, though with increased work of breathing, ongoing nausea, vomiting. Patient is found to have bilateral pleural effusions, evidence for fluid overload status. Patient required admission for dialysis, further evaluation, management   Carmin Muskrat, MD 11/16/15 1106

## 2015-11-16 NOTE — ED Notes (Addendum)
Patient here with increased shortness of breath, vomiting x 2 days. Last dialysis was Friday. Complains of left sided epigastric pain and weakness. Breathing shallow on arrival, states that she feels as if she cant get a good breath, denies pain with inspiration.

## 2015-11-17 ENCOUNTER — Inpatient Hospital Stay: Admission: RE | Admit: 2015-11-17 | Payer: Medicare Other | Source: Ambulatory Visit

## 2015-11-17 ENCOUNTER — Observation Stay (HOSPITAL_COMMUNITY): Payer: Medicare Other

## 2015-11-17 ENCOUNTER — Encounter (HOSPITAL_COMMUNITY): Payer: Self-pay | Admitting: General Practice

## 2015-11-17 DIAGNOSIS — E8779 Other fluid overload: Secondary | ICD-10-CM | POA: Diagnosis not present

## 2015-11-17 DIAGNOSIS — E8770 Fluid overload, unspecified: Secondary | ICD-10-CM | POA: Diagnosis not present

## 2015-11-17 DIAGNOSIS — E440 Moderate protein-calorie malnutrition: Secondary | ICD-10-CM | POA: Insufficient documentation

## 2015-11-17 LAB — BASIC METABOLIC PANEL
Anion gap: 11 (ref 5–15)
BUN: 15 mg/dL (ref 6–20)
CO2: 31 mmol/L (ref 22–32)
CREATININE: 6.74 mg/dL — AB (ref 0.44–1.00)
Calcium: 8.9 mg/dL (ref 8.9–10.3)
Chloride: 99 mmol/L — ABNORMAL LOW (ref 101–111)
GFR calc non Af Amer: 7 mL/min — ABNORMAL LOW (ref >60)
GFR, EST AFRICAN AMERICAN: 8 mL/min — AB (ref >60)
Glucose, Bld: 83 mg/dL (ref 65–99)
Potassium: 4.2 mmol/L (ref 3.5–5.1)
SODIUM: 141 mmol/L (ref 135–145)

## 2015-11-17 LAB — CBC
HEMATOCRIT: 36.8 % (ref 36.0–46.0)
Hemoglobin: 11.6 g/dL — ABNORMAL LOW (ref 12.0–15.0)
MCH: 33 pg (ref 26.0–34.0)
MCHC: 31.5 g/dL (ref 30.0–36.0)
MCV: 104.5 fL — ABNORMAL HIGH (ref 78.0–100.0)
PLATELETS: 246 10*3/uL (ref 150–400)
RBC: 3.52 MIL/uL — AB (ref 3.87–5.11)
RDW: 15.8 % — ABNORMAL HIGH (ref 11.5–15.5)
WBC: 4.1 10*3/uL (ref 4.0–10.5)

## 2015-11-17 LAB — HIV ANTIBODY (ROUTINE TESTING W REFLEX): HIV Screen 4th Generation wRfx: NONREACTIVE

## 2015-11-17 LAB — FOLATE RBC
FOLATE, RBC: 947 ng/mL (ref >498)
Folate, Hemolysate: 380.8 ng/mL
HEMATOCRIT: 40.2 % (ref 34.0–46.6)

## 2015-11-17 MED ORDER — DIATRIZOATE MEGLUMINE & SODIUM 66-10 % PO SOLN
ORAL | Status: AC
  Administered 2015-11-17: 12:00:00
  Filled 2015-11-17: qty 30

## 2015-11-17 MED ORDER — NEPRO/CARBSTEADY PO LIQD
237.0000 mL | Freq: Two times a day (BID) | ORAL | Status: DC
  Administered 2015-11-17: 237 mL via ORAL

## 2015-11-17 NOTE — Progress Notes (Signed)
Subjective: Patient feeling better this morning. Continues to experience some positional SOB and abdominal pain.   Objective: Vital signs in last 24 hours: Filed Vitals:   11/16/15 1632 11/16/15 2038 11/17/15 0532 11/17/15 1413  BP: 151/99 143/103 120/84 130/85  Pulse: 87 84 76 77  Temp: 97.6 F (36.4 C) 98.7 F (37.1 C) 98.4 F (36.9 C) 98.6 F (37 C)  TempSrc: Oral   Oral  Resp: _0 Height:      Weight: 122 lb 2.2 oz (55.4 kg)  121 lb 14.4 oz (55.293 kg)   SpO2: 100% 99% 98% 98%   Labs:  Ref. Range 11/17/2015 07:02  Sodium Latest Ref Range: 135-145 mmol/L 141  Potassium Latest Ref Range: 3.5-5.1 mmol/L 4.2  Chloride Latest Ref Range: 101-111 mmol/L 99 (L)  CO2 Latest Ref Range: 22-32 mmol/L 31  BUN Latest Ref Range: 6-20 mg/dL 15  Creatinine Latest Ref Range: 0.44-1.00 mg/dL 6.74 (H)  Calcium Latest Ref Range: 8.9-10.3 mg/dL 8.9  EGFR (Non-African Amer.) Latest Ref Range: >60 mL/min 7 (L)  EGFR (African American) Latest Ref Range: >60 mL/min 8 (L)  Glucose Latest Ref Range: 65-99 mg/dL 83  Anion gap Latest Ref Range: 5-15  11    Ref. Range 11/17/2015 07:02  WBC Latest Ref Range: 4.0-10.5 K/uL 4.1  RBC Latest Ref Range: 3.87-5.11 MIL/uL 3.52 (L)  Hemoglobin Latest Ref Range: 12.0-15.0 g/dL 11.6 (L)  HCT Latest Ref Range: 36.0-46.0 % 36.8  MCV Latest Ref Range: 78.0-100.0 fL 104.5 (H)  MCH Latest Ref Range: 26.0-34.0 pg 33.0  MCHC Latest Ref Range: 30.0-36.0 g/dL 31.5  RDW Latest Ref Range: 11.5-15.5 % 15.8 (H)  Platelets Latest Ref Range: 150-400 K/uL 246    Ref. Range 11/16/2015 19:08  CRP Latest Ref Range: <1.0 mg/dL 2.0 (H)  Vitamin B12 Latest Ref Range: 180-914 pg/mL 554    Ref. Range 11/16/2015 19:08  Folate, Hemolysate Latest Ref Range: Not Estab. ng/mL 380.8  Folate, RBC Latest Ref Range: >498 ng/mL 947    Ref. Range 11/16/2015 19:08  TSH Latest Ref Range: 0.350-4.500 uIU/mL 1.396    Ref. Range 11/16/2015 19:08  Sed Rate Latest Ref Range: 0-22  mm/hr 15   Physical Exam General: Vital signs reviewed. Patient is well-developed and well-nourished, in no acute distress and cooperative with exam.  Head: Normocephalic and atraumatic. Eyes: PERRLA, no scleral icterus.  Mouth: Normal oropharynx. Neck: Supple, trachea midline, no carotid bruit present.  Cardiovascular: RRR, S1 normal, S2 normal, 3/6 blowing systolic murmur best auscultated in LUSB. Pulmonary/Chest: Mild inspiratory crackles in bilateral lower lung fields - improved this morning, no wheezes or rhonchi. No accessory muscle use, speaking in full sentences. Abdominal: Soft, non-tender, non-distended, BS +.  Musculoskeletal: LUE AVF with good thrill and bruit. Extremities: No lower extremity edema bilaterally, pulses symmetric and intact bilaterally.  Neurological: A&O x3 Skin: Warm, dry and intact. Telangiectasias on upper back. Psychiatric: Normal mood and affect. speech and behavior is normal. Cognition and memory are normal.   Assessment/Plan: Ms. Efaw is a 40 yo female with PMHx of ESRD (unknown cause) on HD MWF and HTN who presented to the ED with shortness of breath and felt to be volume overloaded, now s/p HD.   Volume Overload: Patient presents with progressive shortness of breath, DOE and orthopnea over the past one week. This is similar to her presentation of volume overload one month ago which was relieved by HD. Nephrology has been working to adjust her dry weight. She  is currently below her dry of 58 kg and is 57.3 kg on presentation. Physical exam reveals bilateral rales, but no lower extremity edema or JVD. BNP elevated at 2,445.1. CXR with small bilateral pleural effusions with bilateral atelectasis versus infiltrate. Patient is new to HD in the last 6 months and her fluctuations in symptoms may be secondary to adjusting to HD as she has been compliant with HD, her diet and with her medications. Her ESRD has no known cause per patient. Patient was dialyzed  yesterday (7/10) and feels that her breathing has improved.  -Renal Diet -Nephrology following, appreciate recommendations -Reglan 10 mg Q8H prn (per patient this works for her nausea not zofran) -Would ambulate with pulse ox tomorrow -Daily weights -Continue renvela TID  Abdominal Pain: Associated with N/V, anorexia, and weight loss over the past few months. Also with history of chronic iron deficiency anemia and ESRD of unknown etiology. History is concerning for celiac's disease and warrants further outpatient workup. There is some literature to support an association between celiac's disease and IgA nephropathy in a subset of this population.  - Tissue transglutaminase antibodies pending - CT abdomen pelvis - Outpatient GI referral   HTN: Hypertensive on admission at 159/131. She reports compliance with her medications. BP is normally well controlled at home in the 130s/80s. She occasionally has to take an additional clonidine twice a week for elevated BP.  -Continue home medications of clonidine 0.3 mg patch Qweek (change Saturday) -Continue home Carvedilol 37.5 mg BID -Hold prn clonidine  Anxiety: Patient takes Xanax 0.5 mg TID prn for anxiety. -Continue Xanax as above  Systolic Murmur: Patient has a 3/6 systolic murmur at LUSB on exam. She reports this was first noted several months ago. She has undergone TTE and is unsure of the results. Her PCP has reviewed them. No TTE in our chart or care everywhere. -Consider contacting PCP for records   EKG Changes: EKG shows TWI in leads I, aVL, V4 and V5. New on comparison to May 2017, but similar to June 2017. She denies any chest pain. Troponin negative.  -Follow clinically  DVT/PE ppx: Heparin TID FEN: Renal Code: FULL Dispo: Anticipated discharge in approximately 1-2 day(s).   LOS: 1 day   Velna Ochs, MD 11/17/2015, 3:35 PM Pager: [phone]

## 2015-11-17 NOTE — Care Management Note (Addendum)
Case Management Note  Patient Details  Name: Valerie Yates MRN: [id number] Date of Birth: 1975/12/27  Subjective/Objective:                 Spoke with patient in the room. She lives at home in [place] with her spouse who was at the bedside. She is independent and A&O. SHe has HD MWF at [facility]. She states she is compliant. In obs for fluid overload.    Action/Plan:  CM will continue to follow for DC planning, no needs identified at this time.   Addendum- Will DC to home with spouse, no needs.   Expected Discharge Date:                  Expected Discharge Plan:  Home/Self Care  In-House Referral:     Discharge planning Services  CM Consult  Post Acute Care Choice:    Choice offered to:     DME Arranged:    DME Agency:     HH Arranged:    [place] Agency:     Status of Service:  Completed, signed off  If discussed at [employer] of Stay Meetings, dates discussed:    Additional Comments:  Carles Collet, RN 11/17/2015, 12:01 PM

## 2015-11-17 NOTE — Progress Notes (Signed)
NURSING PROGRESS NOTE  Valerie Yates [id number] Discharge Data: 11/17/2015 7:40 PM Attending Provider: Bartholomew Crews, MD [id number] Lenna Sciara., MD     [name] to be D/C'd Home per MD order.  Discussed with the patient the After Visit Summary and all questions fully answered. All IV's discontinued with no bleeding noted. All belongings returned to patient for patient to take home.   Last Vital Signs:  Blood pressure 130/85, pulse 77, temperature 98.6 F (37 C), temperature source Oral, resp. rate 18, height 5\' 7"  (1.702 m), weight 55.293 kg (121 lb 14.4 oz), SpO2 98 %.  Discharge Medication List   Medication List    TAKE these medications        acetaminophen 500 MG tablet  Commonly known as:  TYLENOL  Take 1,000 mg by mouth every 6 (six) hours as needed for mild pain.     ALPRAZolam 0.5 MG tablet  Commonly known as:  XANAX  Take 0.5 mg by mouth 3 (three) times daily as needed for anxiety.     carvedilol 25 MG tablet  Commonly known as:  COREG  Take 1.5 tablets by mouth 2 (two) times daily.     cloNIDine 0.3 mg/24hr patch  Commonly known as:  CATAPRES - Dosed in mg/24 hr  Place 1 patch onto the skin once a week.     cloNIDine HCl 0.1 MG Tb12 ER tablet  Commonly known as:  KAPVAY  Take 0.1 mg by mouth as needed (For blood pressure >166).     metoCLOPramide 10 MG tablet  Commonly known as:  REGLAN  Take 1 tablet (10 mg total) by mouth every 8 (eight) hours as needed for nausea.     ondansetron 4 MG tablet  Commonly known as:  ZOFRAN  Take 4 mg by mouth every 8 (eight) hours as needed for nausea or vomiting.     oxyCODONE 5 MG immediate release tablet  Commonly known as:  Oxy IR/ROXICODONE  Take 1 tablet (5 mg total) by mouth 2 (two) times daily as needed for severe pain or breakthrough pain.     PROAIR HFA 108 (90 Base) MCG/ACT inhaler  Generic drug:  albuterol  Inhale 2 puffs into the lungs as needed. wheezing     RENVELA 800 MG tablet  Generic drug:   sevelamer carbonate  Take 1,600 mg by mouth 3 (three) times daily before meals.

## 2015-11-17 NOTE — Progress Notes (Signed)
Initial Nutrition Assessment  DOCUMENTATION CODES:   Non-severe (moderate) malnutrition in context of chronic illness  INTERVENTION:   -Continue Nepro Shake po BID, each supplement provides 425 kcal and 19 grams protein  NUTRITION DIAGNOSIS:   Malnutrition related to chronic illness as evidenced by mild depletion of body fat, mild depletion of muscle mass, energy intake < or equal to 75% for > or equal to 1 month, percent weight loss.  GOAL:   Patient will meet greater than or equal to 90% of their needs  MONITOR:   PO intake, Supplement acceptance, Labs, Weight trends, Skin, I & O's  REASON FOR ASSESSMENT:   Malnutrition Screening Tool    ASSESSMENT:   Valerie Yates is a 40 yo female with PMHx of ESRD (unknown cause) on HD MWF and HTN who presented to the ED with shortness of breath. Patient states that one month ago she developed worsening shortness of breath, DOE, orthopnea and swelling in her ankles.  Pt admitted with volume overload.   Hx obtained from pt and pt husband at bedside. Pt reports poor appetite over the past month. She reveals that experiences GI distress from eating certain foods, but has considered this the norm since it happened so often. She shares that she has been recently disgusted by red meat and protein sources include mainly peanut butter and dry beans. She met with HD at HD center on 11/13/15 and per her report, all labs values were WDL and pt was encouraged to liberalize her diet in light of poor intake.   Pt shares that she has lost 60# since initiating HD about 6 months ago. Reviewed wt hx, which revealed a 19.8% wt loss over the past 6 months (which is significant), as well as a 4.7% wt loss over the past 2 months.   Pt and husband share that nephrology is starting work-up to celiac disease, due to multiple symptoms and unexplained renal failure. Pt husband curious about what a gluten-free diet entails. Discussed basics of gluten free diet, but cautioned  pt to refrain from making any changes/ restrictions in her diet until diagnosis is confirmed.   Nepro shake sitting at bedside. Pt is amenable to trying. Discussed ways that pt could add calorie and protein to current diet to prevent further weight loss.   Nutrition-Focused physical exam completed. Findings are mild fat depletion, mild muscle depletion, and mild edema. Noted pt with thin hair on both sides on her face, which may confirm protein deficiency. Unable to assess fingernails, as pt had nail polish.   Labs reviewed.   Diet Order:  Diet renal with fluid restriction Fluid restriction:: 1200 mL Fluid; Room service appropriate?: Yes; Fluid consistency:: Thin  Skin:  Reviewed, no issues  Last BM:  11/16/15  Height:   Ht Readings from Last 1 Encounters:  11/16/15 5' 7"  (1.702 m)    Weight:   Wt Readings from Last 1 Encounters:  11/17/15 121 lb 14.4 oz (55.293 kg)    Ideal Body Weight:  61.4 kg  BMI:  Body mass index is 19.09 kg/(m^2).  Estimated Nutritional Needs:   Kcal:  1700-1900  Protein:  58-100 grams  Fluid:  1.7-1.9 L  EDUCATION NEEDS:   Education needs addressed  Cyenna Rebello A. Jimmye Norman, RD, LDN, CDE Pager: [phone] After hours Pager: [phone]

## 2015-11-17 NOTE — Care Management Obs Status (Signed)
[place] NOTIFICATION   Patient Details  Name: Valerie Yates MRN: [id number] Date of Birth: [birth year]   Medicare Observation Status Notification Given:  Yes [place], RN 11/17/2015, 12:00 PM

## 2015-11-17 NOTE — Progress Notes (Addendum)
Patient admitted under Observation status - if patient status changes to Inpatient status please notify us and will do a formal consultation.   Pt admitted with vol overload and had HD yest and plan HD tomorrow. Real issue though is weight loss, anorexia/ N/V going on for several mos if not longer.  Is well dialyzed by monthly labs.  Have d/w primary team, to get abd CT and w/u for celiac disease.   AF MWF  3h  57- 58kg  350/600  2/2.25 bath  LUA AVF  Hep 2000 Mircera 50 q 2 last 7/5, venofer ?  Kelly Splinter MD [employer] pager [phone]    cell [phone] 11/14/2015, 11:45 AM

## 2015-11-17 NOTE — Discharge Summary (Signed)
Name: Valerie Yates MRN: [id number] DOB: June 30, 1975 40 y.o. PCP: Enid Skeens, MD  Date of Admission: 11/16/2015  7:28 AM Date of Discharge: 11/17/2015 Attending Physician: Bartholomew Crews, MD  Discharge Diagnosis: 1. Volume Overload   Principal Problem:   Volume overload Active Problems:   ESRD on dialysis ([place])   HTN (hypertension), benign   Nausea with vomiting   Malnutrition of moderate degree   Discharge Medications:   Medication List    TAKE these medications        acetaminophen 500 MG tablet  Commonly known as:  TYLENOL  Take 1,000 mg by mouth every 6 (six) hours as needed for mild pain.     ALPRAZolam 0.5 MG tablet  Commonly known as:  XANAX  Take 0.5 mg by mouth 3 (three) times daily as needed for anxiety.     carvedilol 25 MG tablet  Commonly known as:  COREG  Take 1.5 tablets by mouth 2 (two) times daily.     cloNIDine 0.3 mg/24hr patch  Commonly known as:  CATAPRES - Dosed in mg/24 hr  Place 1 patch onto the skin once a week.     cloNIDine HCl 0.1 MG Tb12 ER tablet  Commonly known as:  KAPVAY  Take 0.1 mg by mouth as needed (For blood pressure >166).     metoCLOPramide 10 MG tablet  Commonly known as:  REGLAN  Take 1 tablet (10 mg total) by mouth every 8 (eight) hours as needed for nausea.     ondansetron 4 MG tablet  Commonly known as:  ZOFRAN  Take 4 mg by mouth every 8 (eight) hours as needed for nausea or vomiting.     oxyCODONE 5 MG immediate release tablet  Commonly known as:  Oxy IR/ROXICODONE  Take 1 tablet (5 mg total) by mouth 2 (two) times daily as needed for severe pain or breakthrough pain.     PROAIR HFA 108 (90 Base) MCG/ACT inhaler  Generic drug:  albuterol  Inhale 2 puffs into the lungs as needed. wheezing     RENVELA 800 MG tablet  Generic drug:  sevelamer carbonate  Take 1,600 mg by mouth 3 (three) times daily before meals.        Disposition and follow-up:   Ms.Valerie Yates was discharged from [facility] in Stable condition.  At the hospital follow up visit please address:  1.  Please refer to GI for further work-up of abdominal pain, N/V, and weight loss. History concerning for celiac's disease and may warrant duodenal biopsy.   2.  Labs / imaging needed at time of follow-up: None  3.  Pending labs/ test needing follow-up: Tissue Transglutaminase Ab & CT Abdomen Pelvis   Follow-up Appointments:     Follow-up Information    Follow up with Enid Skeens., MD. Daphane Shepherd on 11/25/2015.   Specialty:  Family Medicine   Why:  Please follow up with PCP on Wednesday the 19th at 1:45pm for hospital follow up   Contact information:   [address] [address] [phone]       Hospital Course by problem list: Principal Problem:   Volume overload Active Problems:   ESRD on dialysis ([place])   HTN (hypertension), benign   Nausea with vomiting   Malnutrition of moderate degree   1. Volume Overload: Patient presents with progressive shortness of breath, DOE and orthopnea over the past one week. This is similar to her presentation of volume overload one month  ago which was relieved by HD. Nephrology has been working to adjust her dry weight. She is currently below her dry of 58 kg and is 57.3 kg on presentation. Physical exam reveals bilateral rales, but no lower extremity edema or JVD. BNP elevated at 2,445.1. CXR with small bilateral pleural effusions with bilateral atelectasis versus infiltrate. Patient is new to HD in the last 6 months and her fluctuations in symptoms may be secondary to adjusting to HD as she has been compliant with HD, her diet and with her medications. Her ESRD has no known cause. Patient was dialyzed 7/10 and felt that her breathing had improved.   Abdominal Pain: Associated with N/V, anorexia, and weight loss over the past few months. Also with history of chronic iron deficiency anemia, and ESRD of unknown etiology. History is concerning for celiac's  disease and warrants further outpatient workup. There is some literature to support an association between celiac's disease and IgA nephropathy in a subset of this population. Tissue transglutaminase antibodies pending. CT abdomen pelvis non contributory. Plan for outpatient GI referral for further work-up. If tissue transglutaminase ab are negative and patient has a high pre-test probability, she will need duodenal biopsy to confirm or rule out.   HTN: Hypertensive on admission at 159/131. She reports compliance with her medications. BP is normally well controlled at home in the 130s/80s. She occasionally has to take an additional clonidine twice a week for elevated BP. Home clonidine 0.3mg  patch Qweek (change Saturday) and home carvedilol 37.5mg  BID continued. PRN clonidine held.  Systolic Murmur: Patient has a 3/6 systolic murmur at LUSB on exam. She reports this was first noted several months ago. She has undergone TTE and is unsure of the results. Her PCP has reviewed them. No TTE in our chart or care everywhere. Consider contacting PCP for records   EKG Changes: EKG shows TWI in leads I, aVL, V4 and V5. New on comparison to May 2017, but similar to June 2017.  Troponin negative. Followed clinically.  Discharge Vitals:   BP 130/85 mmHg  Pulse 77  Temp(Src) 98.6 F (37 C) (Oral)  Resp 18  Ht 5\' 7"  (1.702 m)  Wt 121 lb 14.4 oz (55.293 kg)  BMI 19.09 kg/m2  SpO2 98%  Pertinent Labs, Studies, and Procedures:   11/16/15 CXR 2 View IMPRESSION: No pulmonary edema. Small bilateral pleural effusion with bilateral basilar atelectasis or infiltrate.  11/17/15 CT Abdomen Pelvis Wo Contrast IMPRESSION: Significant cortical thinning of bilateral kidneys which may be seen with chronic renal failure. Stable masslike appearance of the inferior pole of the right kidney, which may represent residual normal renal parenchyma.  Bilateral pleural effusions with bibasilar subsegmental  atelectasis.  Cardiomegaly.  Large amount of solid appearing material within the stomach, is patient postprandial?    Ref. Range 11/16/2015 19:08  Folate, Hemolysate Latest Ref Range: Not Estab. ng/mL 380.8  Folate, RBC Latest Ref Range: >498 ng/mL 947    Ref. Range 11/16/2015 19:08  Vitamin B12 Latest Ref Range: 180-914 pg/mL 554    Ref. Range 11/16/2015 19:08  CRP Latest Ref Range: <1.0 mg/dL 2.0 (H)    Ref. Range 11/16/2015 19:08  Sed Rate Latest Ref Range: 0-22 mm/hr 15    Ref. Range 11/16/2015 19:08  TSH Latest Ref Range: 0.350-4.500 uIU/mL 1.396   Discharge Instructions: Discharge Instructions    Call MD for:  difficulty breathing, headache or visual disturbances    Complete by:  As directed      Call MD for:  persistant nausea and vomiting    Complete by:  As directed      Call MD for:  severe uncontrolled pain    Complete by:  As directed      Diet - low sodium heart healthy    Complete by:  As directed      Discharge instructions    Complete by:  As directed      Increase activity slowly    Complete by:  As directed            Signed: Velna Ochs, MD 11/17/2015, 4:23 PM   Pager: [phone]

## 2015-11-17 NOTE — Progress Notes (Signed)
  Date: 11/17/2015  Patient name: Valerie Yates  Medical record number: [id number]  Date of birth: [birth year]   I have seen and evaluated [name] and discussed their care with the Residency Team. Ms Genao is a 40 yo female with ESRD of unknown cause admitted for dyspnea. She was not felt to have much volume on exam but did have HD and felt better this AM. She discussed with Dr Jonnie Finner her sxs of weight loss, N, ABD pain, iron def anemia and the possibility of Celiac Disease was raised. She has no skin changes and no fam hx of GI illnesses.  Filed Vitals:   11/17/15 0532 11/17/15 1413  BP: 120/84 130/85  Pulse: 76 77  Temp: 98.4 F (36.9 C) 98.6 F (37 C)  Resp: 16 18   NAD HRRR LCTAB ABD + BS Skin tattoos, no other skin abnl  I personally viewed her CXR images and confirmed by reading with the official read. B pleural effusions  I personally viewed her EKG and confirmed by reading with the official read. Sinus, lateral TWI  Assessment and Plan: I have seen and evaluated the patient as outlined above. I agree with the formulated Assessment and Plan as detailed in the residents' note, with the following changes:   1. Volume overload - improved. For HD tomorrow and renal thinks she has a bit more fluid to take off.  2. ABD pain - CT unrevealing. Combined with her other sxs of weight loss and iron def anemia, will order IgA TTG. Since she is high pre-test probability, she will need an outpt sm bowel bx even if TTG is negative. The TTG will help if the bx is indeterminate. She is not to start a GF diet until after the bx.   Likely D/C in AM  Bartholomew Crews, MD 7/11/20175:58 PM

## 2015-11-18 LAB — TISSUE TRANSGLUTAMINASE, IGA: Tissue Transglutaminase Ab, IgA: <2 U/mL (ref 0–3)

## 2016-01-19 ENCOUNTER — Ambulatory Visit: Payer: Medicare Other | Admitting: Cardiology

## 2016-01-26 ENCOUNTER — Ambulatory Visit: Payer: Medicare Other | Admitting: Physical Therapy

## 2016-01-28 ENCOUNTER — Ambulatory Visit: Payer: Medicare Other

## 2016-02-02 ENCOUNTER — Ambulatory Visit: Payer: Medicare Other | Admitting: Physical Therapy

## 2016-02-04 ENCOUNTER — Encounter: Payer: Self-pay | Admitting: Physical Therapy

## 2016-02-04 ENCOUNTER — Ambulatory Visit: Payer: Medicare Other | Attending: Family Medicine | Admitting: Physical Therapy

## 2016-02-04 DIAGNOSIS — M545 Low back pain, unspecified: Secondary | ICD-10-CM

## 2016-02-04 DIAGNOSIS — R262 Difficulty in walking, not elsewhere classified: Secondary | ICD-10-CM | POA: Diagnosis present

## 2016-02-04 NOTE — Therapy (Signed)
[place], [place], [address] Phone: [phone]   Fax:  [phone]  Physical Therapy Evaluation  Patient Details  Name: Valerie Yates MRN: [id number] Date of Birth: 1975-06-04 Referring Provider: Enid Skeens, MD  Encounter Date: 02/04/2016      PT End of Session - 02/04/16 1344    Visit Number 1   Number of Visits 9   Date for PT Re-Evaluation 03/04/16   Authorization Type medicare/BCBS   PT Start Time 1331   PT Stop Time 1425   PT Time Calculation (min) 54 min   Activity Tolerance Patient tolerated treatment well   Behavior During Therapy [facility] for tasks assessed/performed      Past Medical History:  Diagnosis Date  . Anxiety   . Complication of anesthesia    woke up before they took the endotra. tube, once was slow to wake up  . ESRD (end stage renal disease) on dialysis [facility])    "TTS; [place]" (09/14/2015)  . GERD (gastroesophageal reflux disease)   . Headache    "after dialysis" (09/14/2015)  . Heart murmur   . Hyperparathyroidism due to renal insufficiency ([place])   . Hypertension   . Iron deficiency anemia   . Migraine    "a few/year": (09/14/2015)  . Shortness of breath dyspnea    with exertion  . 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 UTI (lower urinary tract infection)    recurrent    Past Surgical History:  Procedure Laterality Date  . ABDOMINAL HYSTERECTOMY  2006  . AV FISTULA PLACEMENT Left 01/27/2015   Procedure:  BASILIC VEIN TRANSPOSITION ;  Surgeon: Angelia Mould, MD;  Location: [place];  Service: Vascular;  Laterality: Left;  . DILATION AND CURETTAGE OF UTERUS    . LAPAROSCOPIC CHOLECYSTECTOMY  2005  . SHOULDER ARTHROSCOPY W/ ROTATOR CUFF REPAIR Left 2012  . TUBAL LIGATION  2003    There were no vitals filed for this visit.       Subjective Assessment - 02/04/16 1332    Subjective in 2009 was on hands and knees at work and  felt a snap. Was told she would need surgery but is on kidney transplant list so she is unable. Unable to drive home following following 3 hr dialysis due to back pain.    Limitations Sitting;Standing   How long can you sit comfortably? 1 hour on a good day, 20 min otherwise   Patient Stated Goals decrease pain, full body strengthening, steps/stairs   Currently in Pain? Yes   Pain Score 4   up to 10/10 in last few days   Pain Location Back   Pain Orientation Left   Pain Descriptors / Indicators Sharp;Throbbing;Aching   Pain Type Chronic pain   Aggravating Factors  laying supine, reclining   Pain Relieving Factors medications-somewhat            [facility] PT Assessment - 02/04/16 0001      Assessment   Medical Diagnosis LBP   Referring Provider Enid Skeens, MD   Hand Dominance Right   Prior Therapy no     Precautions   Precaution Comments avoiding using L arm     Restrictions   Weight Bearing Restrictions Yes   LUE Weight Bearing Non weight bearing     Balance Screen   Has the patient fallen in the past 6 months Yes   How many times? 1  [place] Private residence   Living Arrangements Spouse/significant other   Additional Comments steps to enter home     Prior Function   Level of Independence Independent     Cognition   Overall Cognitive Status Within Functional Limits for tasks assessed     Observation/Other Assessments   Focus on Therapeutic Outcomes (FOTO)  36% ability     Sensation   Additional Comments WFL     Posture/Postural Control   Posture Comments pain when placing equal weight in R and L lower extrremities     ROM / Strength   AROM / PROM / Strength AROM;Strength     AROM   AROM Assessment Site Lumbar   Lumbar Flexion --  WFL with concordant pain     Strength   Overall Strength Comments did not test at eval due to pelvic rotation   Strength Assessment Site Hip   Right/Left Hip Right;Left     Palpation    Palpation comment TTP R SIJ, piriformis                    [facility] Adult PT Treatment/Exercise - 02/04/16 0001      Exercises   Exercises Knee/Hip     Knee/Hip Exercises: Stretches   Active Hamstring Stretch 5 reps  5 ankle pumps each     Knee/Hip Exercises: Supine   Other Supine Knee/Hip Exercises hooklying pelvic tilts   Other Supine Knee/Hip Exercises pelvic tilt + ball squeeze     Modalities   Modalities Moist Heat     Moist Heat Therapy   Number Minutes Moist Heat 10 Minutes   Moist Heat Location Hip  R     Manual Therapy   Manual Therapy Muscle Energy Technique;Myofascial release   Myofascial Release pelvic rotation MET (R quads, L HS)   Muscle Energy Technique trigger point release hip ER musculature                PT Education - 02/04/16 1322    Education provided Yes   Education Details anatomy of condition, POC, HEP, exercise form/rationale   Person(s) Educated Patient   Methods Explanation;Demonstration;Tactile cues;Verbal cues;Handout   Comprehension Verbalized understanding;Returned demonstration;Verbal cues required;Tactile cues required;Need further instruction          PT Short Term Goals - 02/04/16 1701      PT SHORT TERM GOAL #1   Title Pt will be able to sit through dialysis treatments LBP <=3/10 by 10/27   Baseline severe pain at eval   Time 4   Period Weeks   Status New     PT SHORT TERM GOAL #2   Title Pt will be able to climb stairs at home & in the community step over step without increase in back pain   Baseline unable at eval   Time 4   Period Weeks   Status New     PT SHORT TERM GOAL #3   Title FOTO to 52% ability to indicate significant improvement in functional ability   Baseline 36% ability at eval   Time 4   Period Weeks   Status New     PT SHORT TERM GOAL #4   Title Pt will be able to perform all self care activities such as bathing and dressing without increase in LBP.    Baseline severe pain at eval    Time 4   Period Weeks   Status New  Plan - 02/04/16 1656    Clinical Impression Statement Pt presents to PT with complaints of LBP (pointing to R SIJ) that causes severe pain and limits functional ability, has had to stop dialysis treatments due to pain. Notable innominate rotation corrected today with MET and manual release to hip musculature. Pt will benefit from skilled PT in order to stabilize lumbo pelvic region to reduce pain.    Rehab Potential Good   PT Frequency 2x / week   PT Duration 4 weeks   PT Treatment/Interventions ADLs/Self Care Home Management;Cryotherapy;Electrical Stimulation;Functional mobility training;Stair training;Gait training;Moist Heat;Therapeutic activities;Therapeutic exercise;Traction;Ultrasound;Balance training;Neuromuscular re-education;Patient/family education;Passive range of motion;Manual techniques;Taping   PT Next Visit Plan MET prn, lumbo pelvic stability   PT Home Exercise Plan hooklying ball squeeze, pelvic tilt, lumbar pillow & avoid crossing legs   Consulted and Agree with Plan of Care Patient      Patient will benefit from skilled therapeutic intervention in order to improve the following deficits and impairments:  Difficulty walking, Increased muscle spasms, Decreased activity tolerance, Pain, Improper body mechanics, Postural dysfunction  Visit Diagnosis: Left-sided low back pain without sciatica - Plan: PT plan of care cert/re-cert  Difficulty in walking, not elsewhere classified - Plan: PT plan of care cert/re-cert      G-Codes - 88/33/74 1704    Functional Assessment Tool Used FOTO 68% disability (goal 48% disability), clinical judgement   Functional Limitation Mobility: Walking and moving around   Mobility: Walking and Moving Around Current Status [phone]) At least 60 percent but less than 80 percent impaired, limited or restricted   Mobility: Walking and Moving Around Goal Status [phone]) At least 40 percent  but less than 60 percent impaired, limited or restricted       Problem List Patient Active Problem List   Diagnosis Date Noted  . Malnutrition of moderate degree 11/17/2015  . Nausea with vomiting 11/16/2015  . Volume overload 11/16/2015  . HTN (hypertension), benign   . ESRD on dialysis ([place])     Shatana Saxton C. Alexandros Ewan PT, DPT 02/04/16 5:07 PM   [facility] [address] [place], [place], [address] Phone: [phone]   Fax:  [phone]  Name: Merelin Human MRN: [id number] Date of Birth: 1976-02-25

## 2016-02-09 ENCOUNTER — Ambulatory Visit: Payer: Medicare Other | Attending: Family Medicine | Admitting: Physical Therapy

## 2016-02-09 ENCOUNTER — Encounter: Payer: Self-pay | Admitting: Physical Therapy

## 2016-02-09 DIAGNOSIS — M545 Low back pain, unspecified: Secondary | ICD-10-CM

## 2016-02-09 DIAGNOSIS — R262 Difficulty in walking, not elsewhere classified: Secondary | ICD-10-CM

## 2016-02-09 NOTE — Therapy (Signed)
[place], [place], [address] Phone: [phone]   Fax:  [phone]  Physical Therapy Treatment  Patient Details  Name: Valerie Yates MRN: [id number] Date of Birth: 1975-07-27 Referring Provider: Enid Skeens, MD  Encounter Date: 02/09/2016      PT End of Session - 02/09/16 1143    Visit Number 2   Number of Visits 9   Date for PT Re-Evaluation 03/04/16   Authorization Type medicare/BCBS   PT Start Time 1145   PT Stop Time 1240   PT Time Calculation (min) 55 min   Activity Tolerance Patient tolerated treatment well   Behavior During Therapy [facility] for tasks assessed/performed      Past Medical History:  Diagnosis Date  . Anxiety   . Complication of anesthesia    woke up before they took the endotra. tube, once was slow to wake up  . ESRD (end stage renal disease) on dialysis [facility])    "TTS; [place]" (09/14/2015)  . GERD (gastroesophageal reflux disease)   . Headache    "after dialysis" (09/14/2015)  . Heart murmur   . Hyperparathyroidism due to renal insufficiency ([place])   . Hypertension   . Iron deficiency anemia   . Migraine    "a few/year": (09/14/2015)  . Shortness of breath dyspnea    with exertion  . 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 UTI (lower urinary tract infection)    recurrent    Past Surgical History:  Procedure Laterality Date  . ABDOMINAL HYSTERECTOMY  2006  . AV FISTULA PLACEMENT Left 01/27/2015   Procedure:  BASILIC VEIN TRANSPOSITION ;  Surgeon: Angelia Mould, MD;  Location: [place];  Service: Vascular;  Laterality: Left;  . DILATION AND CURETTAGE OF UTERUS    . LAPAROSCOPIC CHOLECYSTECTOMY  2005  . SHOULDER ARTHROSCOPY W/ ROTATOR CUFF REPAIR Left 2012  . TUBAL LIGATION  2003    There were no vitals filed for this visit.      Subjective Assessment - 02/09/16 1144    Subjective Pt reports doing well after last visit but  got stuck in one postiion on sunday and has been hurting all down post R leg since.    Currently in Pain? Yes   Pain Score 6    Pain Location Back   Pain Orientation Right   Pain Radiating Towards R leg                         [facility] Adult PT Treatment/Exercise - 02/09/16 0001      Knee/Hip Exercises: Stretches   Other Knee/Hip Stretches LTR 5x10s each side     Knee/Hip Exercises: Standing   Other Standing Knee Exercises pelvic tilts-standing, seated, supine (reclined)     Knee/Hip Exercises: Supine   Bridges with Ball Squeeze 15 reps   Other Supine Knee/Hip Exercises heel squeezes in reclined position   Other Supine Knee/Hip Exercises PT resisted iso abd/add     Modalities   Modalities Cryotherapy;Electrical Stimulation     Cryotherapy   Number Minutes Cryotherapy 10 Minutes  concurrent with ESTIM   Cryotherapy Location Lumbar Spine   Type of Cryotherapy Ice pack     Electrical Stimulation   Electrical Stimulation Location lumbar/SIJ   Electrical Stimulation Action IFC   Electrical Stimulation Parameters 10' concurrent with cryotherapy   Electrical Stimulation Goals Pain     Manual Therapy   Manual Therapy  Muscle Energy Technique;Myofascial release   Manual therapy comments 2' R hip ER release on towel roll   Myofascial Release pelvic rotation MET (R quads, L HS)   Muscle Energy Technique trigger point release hip ER musculature                PT Education - 02/09/16 1233    Education provided Yes   Education Details exercise form/rationale, posture   Person(s) Educated Patient   Methods Explanation;Demonstration;Tactile cues;Verbal cues   Comprehension Verbalized understanding;Returned demonstration;Verbal cues required;Tactile cues required;Need further instruction          PT Short Term Goals - 02/04/16 1701      PT SHORT TERM GOAL #1   Title Pt will be able to sit through dialysis treatments LBP <=3/10 by 10/27   Baseline severe  pain at eval   Time 4   Period Weeks   Status New     PT SHORT TERM GOAL #2   Title Pt will be able to climb stairs at home & in the community step over step without increase in back pain   Baseline unable at eval   Time 4   Period Weeks   Status New     PT SHORT TERM GOAL #3   Title FOTO to 52% ability to indicate significant improvement in functional ability   Baseline 36% ability at eval   Time 4   Period Weeks   Status New     PT SHORT TERM GOAL #4   Title Pt will be able to perform all self care activities such as bathing and dressing without increase in LBP.    Baseline severe pain at eval   Time 4   Period Weeks   Status New                  Plan - 02/09/16 1233    Clinical Impression Statement Pt presented with pelvic rotation today that was corrected with MET, released trigger points in hip external rotator musculature that was creating concordant pain distally. Recreated (to the best of our ability) dialysis chair and practiced pelvic tilt and heel squeezes to decrease pain/provide support while doing dialysis. Pt noted to have significant difficulty contracting abdominal wall but was successful with practice.    PT Next Visit Plan MET prn, lumbo pelvic stability   Consulted and Agree with Plan of Care Patient      Patient will benefit from skilled therapeutic intervention in order to improve the following deficits and impairments:     Visit Diagnosis: Acute left-sided low back pain without sciatica  Difficulty in walking, not elsewhere classified     Problem List Patient Active Problem List   Diagnosis Date Noted  . Malnutrition of moderate degree 11/17/2015  . Nausea with vomiting 11/16/2015  . Volume overload 11/16/2015  . HTN (hypertension), benign   . ESRD on dialysis ([place])    Sheridyn Canino C. Trinisha Paget PT, DPT 02/09/16 12:37 PM   [facility] [address] [place], [place],  [address] Phone: [phone]   Fax:  [phone]  Name: Valerie Yates MRN: [id number] Date of Birth: April 17, 1976

## 2016-02-11 ENCOUNTER — Ambulatory Visit: Payer: Medicare Other | Admitting: Physical Therapy

## 2016-02-11 ENCOUNTER — Encounter: Payer: Self-pay | Admitting: Physical Therapy

## 2016-02-11 DIAGNOSIS — M545 Low back pain, unspecified: Secondary | ICD-10-CM

## 2016-02-11 DIAGNOSIS — R262 Difficulty in walking, not elsewhere classified: Secondary | ICD-10-CM

## 2016-02-11 NOTE — Therapy (Signed)
[place], [place], [address] Phone: [phone]   Fax:  [phone]  Physical Therapy Treatment  Patient Details  Name: Valerie Yates MRN: [id number] Date of Birth: May 23, 1975 Referring Provider: Enid Skeens, MD  Encounter Date: 02/11/2016      PT End of Session - 02/11/16 0853    Visit Number 3   Number of Visits 9   Date for PT Re-Evaluation 03/04/16   Authorization Type medicare/BCBS   PT Start Time 0848   PT Stop Time 0943   PT Time Calculation (min) 55 min   Activity Tolerance Patient limited by pain   Behavior During Therapy [facility] for tasks assessed/performed      Past Medical History:  Diagnosis Date  . Anxiety   . Complication of anesthesia    woke up before they took the endotra. tube, once was slow to wake up  . ESRD (end stage renal disease) on dialysis [facility])    "TTS; [place]" (09/14/2015)  . GERD (gastroesophageal reflux disease)   . Headache    "after dialysis" (09/14/2015)  . Heart murmur   . Hyperparathyroidism due to renal insufficiency ([place])   . Hypertension   . Iron deficiency anemia   . Migraine    "a few/year": (09/14/2015)  . Shortness of breath dyspnea    with exertion  . 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 UTI (lower urinary tract infection)    recurrent    Past Surgical History:  Procedure Laterality Date  . ABDOMINAL HYSTERECTOMY  2006  . AV FISTULA PLACEMENT Left 01/27/2015   Procedure:  BASILIC VEIN TRANSPOSITION ;  Surgeon: Angelia Mould, MD;  Location: [place];  Service: Vascular;  Laterality: Left;  . DILATION AND CURETTAGE OF UTERUS    . LAPAROSCOPIC CHOLECYSTECTOMY  2005  . SHOULDER ARTHROSCOPY W/ ROTATOR CUFF REPAIR Left 2012  . TUBAL LIGATION  2003    There were no vitals filed for this visit.      Subjective Assessment - 02/11/16 0853    Subjective Pt reports dialysis was better yesterday using pillow  and exercises, has pain today at R SIJ and hamstring.    Patient Stated Goals decrease pain, full body strengthening, steps/stairs   Currently in Pain? Yes   Pain Score 6    Pain Location Back   Pain Orientation Right;Lower   Pain Descriptors / Indicators Throbbing                         [facility] Adult PT Treatment/Exercise - 02/11/16 0001      Knee/Hip Exercises: Stretches   Active Hamstring Stretch 5 reps   Piriformis Stretch 2 reps;30 seconds     Knee/Hip Exercises: Supine   Other Supine Knee/Hip Exercises iso add in hooklying   Other Supine Knee/Hip Exercises pelvic tilts     Moist Heat Therapy   Number Minutes Moist Heat 15 Minutes  concurrent with ESTIM   Moist Heat Location Lumbar Spine     Electrical Stimulation   Electrical Stimulation Location lumbar/SIJ   Electrical Stimulation Action IFC   Electrical Stimulation Parameters 15' concurrent with heat   Electrical Stimulation Goals Pain     Manual Therapy   Myofascial Release hip external rotators, QL on R, roller to ITB and hamstrings   Muscle Energy Technique R quads/L hamstring  PT Education - 02/11/16 1023    Education provided Yes   Education Details exercise form/rationale   Person(s) Educated Patient   Methods Explanation;Demonstration;Tactile cues;Verbal cues   Comprehension Verbalized understanding;Returned demonstration;Verbal cues required;Tactile cues required;Need further instruction          PT Short Term Goals - 02/04/16 1701      PT SHORT TERM GOAL #1   Title Pt will be able to sit through dialysis treatments LBP <=3/10 by 10/27   Baseline severe pain at eval   Time 4   Period Weeks   Status New     PT SHORT TERM GOAL #2   Title Pt will be able to climb stairs at home & in the community step over step without increase in back pain   Baseline unable at eval   Time 4   Period Weeks   Status New     PT SHORT TERM GOAL #3   Title FOTO to 52%  ability to indicate significant improvement in functional ability   Baseline 36% ability at eval   Time 4   Period Weeks   Status New     PT SHORT TERM GOAL #4   Title Pt will be able to perform all self care activities such as bathing and dressing without increase in LBP.    Baseline severe pain at eval   Time 4   Period Weeks   Status New                  Plan - 02/11/16 1206    Clinical Impression Statement Unable to fully correct rotation today due to increased muscle spasm but was able to decrease pain with manual treatment and modalities. Pt is getting relief but continues to wake with pain, discussed sleeping posture and mattresses today.    PT Next Visit Plan MET prn, lumbo pelvic stability   Consulted and Agree with Plan of Care Patient      Patient will benefit from skilled therapeutic intervention in order to improve the following deficits and impairments:     Visit Diagnosis: Acute left-sided low back pain without sciatica  Difficulty in walking, not elsewhere classified     Problem List Patient Active Problem List   Diagnosis Date Noted  . Malnutrition of moderate degree 11/17/2015  . Nausea with vomiting 11/16/2015  . Volume overload 11/16/2015  . HTN (hypertension), benign   . ESRD on dialysis ([place])     Deddrick Saindon C. Delmar Dondero PT, DPT 02/11/16 12:13 PM   [place], [place], [address] Phone: [phone]   Fax:  [phone]  Name: Valerie Yates MRN: [id number] Date of Birth: March 13, 1976

## 2016-02-16 ENCOUNTER — Ambulatory Visit: Payer: Medicare Other | Admitting: Physical Therapy

## 2016-02-16 ENCOUNTER — Encounter: Payer: Self-pay | Admitting: Physical Therapy

## 2016-02-16 DIAGNOSIS — M545 Low back pain, unspecified: Secondary | ICD-10-CM

## 2016-02-16 DIAGNOSIS — R262 Difficulty in walking, not elsewhere classified: Secondary | ICD-10-CM

## 2016-02-16 NOTE — Therapy (Signed)
[place], [place], [address] Phone: [phone]   Fax:  [phone]  Physical Therapy Treatment  Patient Details  Name: Valerie Yates MRN: [id number] Date of Birth: 1975-10-24 Referring Provider: Enid Skeens, MD  Encounter Date: 02/16/2016      PT End of Session - 02/16/16 1106    Visit Number 4   Number of Visits 9   Date for PT Re-Evaluation 03/04/16   Authorization Type medicare/BCBS   PT Start Time 1104   PT Stop Time 1145   PT Time Calculation (min) 41 min   Activity Tolerance Patient tolerated treatment well   Behavior During Therapy [facility] for tasks assessed/performed      Past Medical History:  Diagnosis Date  . Anxiety   . Complication of anesthesia    woke up before they took the endotra. tube, once was slow to wake up  . ESRD (end stage renal disease) on dialysis [facility])    "TTS; [place]" (09/14/2015)  . GERD (gastroesophageal reflux disease)   . Headache    "after dialysis" (09/14/2015)  . Heart murmur   . Hyperparathyroidism due to renal insufficiency ([place])   . Hypertension   . Iron deficiency anemia   . Migraine    "a few/year": (09/14/2015)  . Shortness of breath dyspnea    with exertion  . 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 UTI (lower urinary tract infection)    recurrent    Past Surgical History:  Procedure Laterality Date  . ABDOMINAL HYSTERECTOMY  2006  . AV FISTULA PLACEMENT Left 01/27/2015   Procedure:  BASILIC VEIN TRANSPOSITION ;  Surgeon: Angelia Mould, MD;  Location: [place];  Service: Vascular;  Laterality: Left;  . DILATION AND CURETTAGE OF UTERUS    . LAPAROSCOPIC CHOLECYSTECTOMY  2005  . SHOULDER ARTHROSCOPY W/ ROTATOR CUFF REPAIR Left 2012  . TUBAL LIGATION  2003    There were no vitals filed for this visit.      Subjective Assessment - 02/16/16 1107    Subjective pain down R leg to knee. Has been trying to  lay more on L with pillow b/w knees. Utilized MET to correct SIJ pain which helped. continues to have pain with dialysis- had to sit in crooked chair last round.    Patient Stated Goals decrease pain, full body strengthening, steps/stairs   Currently in Pain? Yes   Pain Score 4    Pain Location --  SIJ   Pain Orientation Right   Pain Descriptors / Indicators Throbbing;Aching   Pain Radiating Towards R hamstring   Aggravating Factors  bending forward                         [facility] Adult PT Treatment/Exercise - 02/16/16 0001      Knee/Hip Exercises: Stretches   Active Hamstring Stretch Limitations 3x15 ankle pumps     Knee/Hip Exercises: Aerobic   Nustep L4 6'     Knee/Hip Exercises: Standing   Other Standing Knee Exercises L foot forwad, R back- abd set with glut set     Manual Therapy   Myofascial Release roller and trigger point release R HS                PT Education - 02/16/16 1256    Education provided Yes   Education Details exercise form/rationale, standing posture for neutral pelvis, use of home TENS  Person(s) Educated Patient   Methods Explanation;Demonstration;Tactile cues;Verbal cues;Handout   Comprehension Verbalized understanding;Returned demonstration;Verbal cues required;Tactile cues required;Need further instruction          PT Short Term Goals - 02/04/16 1701      PT SHORT TERM GOAL #1   Title Pt will be able to sit through dialysis treatments LBP <=3/10 by 10/27   Baseline severe pain at eval   Time 4   Period Weeks   Status New     PT SHORT TERM GOAL #2   Title Pt will be able to climb stairs at home & in the community step over step without increase in back pain   Baseline unable at eval   Time 4   Period Weeks   Status New     PT SHORT TERM GOAL #3   Title FOTO to 52% ability to indicate significant improvement in functional ability   Baseline 36% ability at eval   Time 4   Period Weeks   Status New     PT  SHORT TERM GOAL #4   Title Pt will be able to perform all self care activities such as bathing and dressing without increase in LBP.    Baseline severe pain at eval   Time 4   Period Weeks   Status New                  Plan - 02/16/16 1146    Clinical Impression Statement Pt was provided with home TENS unit today with instruction for use. Multiple trigger points in R hamstring recreated concordant pain with palpation and was decreased with treatment. Placed pt in standing posture L foot slightly ahead to activate LE biomechanical chain appropriately for SIJ alignment. Pt denied modalities following treatment, stating significant decrease in pain.    PT Next Visit Plan MET prn, lumbo pelvic stability   Consulted and Agree with Plan of Care Patient      Patient will benefit from skilled therapeutic intervention in order to improve the following deficits and impairments:     Visit Diagnosis: Acute left-sided low back pain without sciatica  Difficulty in walking, not elsewhere classified     Problem List Patient Active Problem List   Diagnosis Date Noted  . Malnutrition of moderate degree 11/17/2015  . Nausea with vomiting 11/16/2015  . Volume overload 11/16/2015  . HTN (hypertension), benign   . ESRD on dialysis ([place])     Myeshia Fojtik C. Annali Lybrand PT, DPT 02/16/16 1:01 PM   [facility] [address] [place], [place], [address] Phone: [phone]   Fax:  [phone]  Name: Valerie Yates MRN: [id number] Date of Birth: 1976-05-02

## 2016-02-18 ENCOUNTER — Ambulatory Visit: Payer: Medicare Other | Admitting: Physical Therapy

## 2016-02-23 ENCOUNTER — Encounter: Payer: Self-pay | Admitting: Cardiology

## 2016-02-23 ENCOUNTER — Ambulatory Visit: Payer: Medicare Other | Admitting: Physical Therapy

## 2016-02-23 ENCOUNTER — Encounter: Payer: Self-pay | Admitting: Physical Therapy

## 2016-02-23 ENCOUNTER — Ambulatory Visit (INDEPENDENT_AMBULATORY_CARE_PROVIDER_SITE_OTHER): Payer: Medicare Other | Admitting: Cardiology

## 2016-02-23 VITALS — BP 170/106 | HR 87 | Ht 67.0 in | Wt 118.0 lb

## 2016-02-23 DIAGNOSIS — M545 Low back pain, unspecified: Secondary | ICD-10-CM

## 2016-02-23 DIAGNOSIS — R931 Abnormal findings on diagnostic imaging of heart and coronary circulation: Secondary | ICD-10-CM

## 2016-02-23 DIAGNOSIS — E440 Moderate protein-calorie malnutrition: Secondary | ICD-10-CM | POA: Diagnosis not present

## 2016-02-23 DIAGNOSIS — R262 Difficulty in walking, not elsewhere classified: Secondary | ICD-10-CM

## 2016-02-23 DIAGNOSIS — I10 Essential (primary) hypertension: Secondary | ICD-10-CM | POA: Diagnosis not present

## 2016-02-23 DIAGNOSIS — Z992 Dependence on renal dialysis: Secondary | ICD-10-CM

## 2016-02-23 DIAGNOSIS — N186 End stage renal disease: Secondary | ICD-10-CM | POA: Diagnosis not present

## 2016-02-23 DIAGNOSIS — R112 Nausea with vomiting, unspecified: Secondary | ICD-10-CM | POA: Diagnosis not present

## 2016-02-23 NOTE — Progress Notes (Signed)
PCP: Enid Skeens., MD NEPHROLOGY: Dr. Mercy Moore; Juanell Fairly, BP   Clinic Note: Chief Complaint  Patient presents with  . Follow-up    Murmur and extra heart beat.    HPI: Valerie Yates is a 40 y.o. female with a PMH below who presents today for Initial cardiology consultation for an abnormal echocardiogram (unfortunate not available at time of this visit). Echocardiogram was performed for murmur. 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   She was referred by the Nephrology team caring for her (Dr. Mercy Moore and Juanell Fairly, NP). Apparently in their evaluation during her dialysis sessions, she was noted to have a murmur and was referred for echo. The best I can tell from her initial description was that it sounds like she has tricuspid regurgitation.  Recent Hospitalizations: Was in the hospital in May and July related to nausea. Partially related to hypertension.  Studies Reviewed:   Transthoracic echocardiogram from [facility] 10/27/2015- not available when I saw her  Mildly impaired LV function with EF of 45-50%. Pseudo-normal LV filling (GR 2 DD. Moderate MR and moderate TR. RVSP estimated at 52 mmHg.   Transthoracic Echo 04/09/2014 [facility]): Low normal LV function with EF 50-55%. Trace MR and TR. Essentially normal echo  [facility] Myoview April 2016 [facility]): EF~70%, No RWMA. No fixed or reversible perfusion defects.    Interval History: Valerie Yates presents today somewhat unsure as to the real reason why she is here. She was told that she had a murmur, that was not noted back in 2015. She had a pretransplant evaluation for her end-stage renal disease with an echocardiogram and a stress test (PSH updated).   Overall from a cardiac symptom standpoint the only thing she really notices occasional brief episodes of palpitations where she feels her heart rate going pretty fast. Shortly after these episodes she feels that she is on a  "high". Otherwise she really denies any significant symptoms other than a few palpitations. She denies any anginal or heart fire symptoms.  No chest pain or shortness of breath with rest or exertion. No PND, orthopnea or edema. No palpitations, lightheadedness, dizziness, weakness or syncope/near syncope. No TIA/amaurosis fugax symptoms. No melena, hematochezia, hematuria, or epstaxis. No claudication.  ROS: A comprehensive was performed. Review of Systems  Constitutional: Positive for weight loss (Can't seem to hold on).  Respiratory: Negative for cough and shortness of breath.   Cardiovascular:       Per history of present illness  Gastrointestinal: Positive for nausea (Related to blood pressure exacerbation).  Musculoskeletal: Negative for joint pain and myalgias.  Neurological: Positive for dizziness (Occasionally, when her blood pressure gets high) and headaches (1 blood pressure gets up.). Negative for tingling, sensory change, speech change and focal weakness.  Endo/Heme/Allergies: Bruises/bleeds easily.  Psychiatric/Behavioral: Negative for depression and memory loss. The patient is nervous/anxious. The patient does not have insomnia.      Past Medical History:  Diagnosis Date  . Anxiety   . Complication of anesthesia    woke up before they took the endotra. tube, once was slow to wake up  . ESRD (end stage renal disease) on dialysis [facility])    "TTS; [place]" (09/14/2015)  . GERD (gastroesophageal reflux disease)   . Headache    "after dialysis" (09/14/2015)  . Heart murmur   . Hyperparathyroidism due to renal insufficiency ([place])   . Hypertension   . Iron deficiency anemia   . Migraine    "a few/year": (  09/14/2015)  . Shortness of breath dyspnea    with exertion  . 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 UTI (lower urinary tract infection)    recurrent    Past Surgical History:  Procedure Laterality Date  . ABDOMINAL HYSTERECTOMY   2006  . AV FISTULA PLACEMENT Left 01/27/2015   Procedure:  BASILIC VEIN TRANSPOSITION ;  Surgeon: Angelia Mould, MD;  Location: [place];  Service: Vascular;  Laterality: Left;  . DILATION AND CURETTAGE OF UTERUS    . LAPAROSCOPIC CHOLECYSTECTOMY  2005  . NM MYOVIEW LTD  08/2014   No fixed or reversible perfusion defect - to suggest Infarct or Ischeima.. EF ~70%.    Marland Kitchen SHOULDER ARTHROSCOPY W/ ROTATOR CUFF REPAIR Left 2012  . TRANSTHORACIC ECHOCARDIOGRAM  04/2014   Low normal EF of 50-55%. No RWMA.  Tr MR/TR.  NORMAL  . TUBAL LIGATION  2003     Prior to Admission medications   Medication Sig Start Date End Date Taking? Authorizing Provider  acetaminophen (TYLENOL) 500 MG tablet Take 1,000 mg by mouth every 6 (six) hours as needed for mild pain.   Yes Historical Provider, MD  ALPRAZolam Duanne Moron) 0.5 MG tablet Take 0.5 mg by mouth 3 (three) times daily as needed for anxiety.   Yes Historical Provider, MD  carvedilol (COREG) 25 MG tablet Take 2 tablets by mouth 2 (two) times daily with a meal.  10/26/15  Yes Historical Provider, MD  cloNIDine (CATAPRES - DOSED IN MG/24 HR) 0.3 mg/24hr patch Place 1 patch onto the skin once a week. 11/04/15  Yes Historical Provider, MD  cloNIDine HCl (KAPVAY) 0.1 MG TB12 ER tablet Take 0.1 mg by mouth as needed (For blood pressure >166).    Yes Historical Provider, MD  oxyCODONE-acetaminophen (PERCOCET/ROXICET) 5-325 MG tablet Take 1 tablet by mouth every 6 (six) hours as needed for severe pain.   Yes Historical Provider, MD  RENVELA 800 MG tablet Take 1,600 mg by mouth 3 (three) times daily before meals. 09/07/15  Yes Historical Provider, MD   Allergies  Allergen Reactions  . Azithromycin Anaphylaxis and Rash  . Erythromycin Hives, Shortness Of Breath and Rash    Acne Aid-Dermatological.  . Penicillins Hives and Shortness Of Breath  . Morphine Sulfate Rash    Concentrate-Analgesics-Opioid     Social History   Social History  . Marital status: Married      Spouse name: N/A  . Number of children: N/A  . Years of education: N/A   Social History Main Topics  . Smoking status: Never Smoker  . Smokeless tobacco: Never Used  . Alcohol use No  . Drug use: No  . Sexual activity: Yes   Other Topics Concern  . None   Social History Narrative  . None    Family History  Problem Relation Age of Onset  . Diabetes Maternal Grandmother   . Cancer Mother     Breast  . Heart disease Maternal Grandfather     Wt Readings from Last 3 Encounters:  02/23/16 53.5 kg (118 lb)  11/17/15 55.3 kg (121 lb 14.4 oz)  09/15/15 57.9 kg (127 lb 10.3 oz)    PHYSICAL EXAM BP (!) 170/106   Pulse 87   Ht 5\' 7"  (1.702 m)   Wt 53.5 kg (118 lb)   BMI 18.48 kg/m  General appearance: alert, cooperative, appears stated age, no distress and Relatively healthy-appearing. Neck: no adenopathy, no carotid bruit and no JVD Lungs:  CTA B, normal percussion bilaterally and non-labored Heart: regular rate and rhythm, S1&  S2 normal, no click, rub or gallop, nondisplaced PMI. He does have a maybe 2/6 HSM that appears to be her throughout loudest along the sternal border. Abdomen: soft, non-tender; bowel sounds normal; no masses,  no organomegaly; No HJR Extremities: extremities normal, atraumatic, no cyanosis, and edema - trace Pulses: 2+ and symmetric; mildly diminshed pedal pulses bilaterally. Skin: mobility and turgor normal and She has a [place].  Neurologic: Mental status: Alert, oriented, thought content appropriate Cranial nerves: normal (II-XII grossly intact)    Adult ECG Report  Rate: 87 ;  Rhythm: normal sinus rhythm and Nonspecific ST-T wave abnormalities in lateral leads.;   Narrative Interpretation: Borderline EKG. ST abnormalities do not appear to be ischemic in nature.    Other studies Reviewed: Additional studies/ records that were reviewed today include:  Recent Labs: Not available   ASSESSMENT / PLAN: Problem List Items Addressed This  Visit    Poorly-controlled hypertension - Primary (Chronic)    Blood pressure is quite high today on significant medications. I question whether or not she could benefit from a calcium channel blocker. Will defer to nephrology.      Nausea with vomiting    Not really sure what to make of the dizziness, it may just be related to her hypertension.  She is on a very interesting and evidence of regimen there is been established by her nephrologist. Will defer to the coach to treat post injury replacements more aggressively.      Malnutrition of moderate degree   ESRD on dialysis ([place])   Abnormal echocardiogram findings without diagnosis (Chronic)    Unfortunately, at the time of this visit, do not have the echo to review.  Complete evaluation of the echocardiogram showed moderate MR and TR with mildly reduced EF, presumably related to hypertension. Also noted elevated RV pressures of 52 mmHg systolic.  Based on findings and doesn't goes, I don't think we need to continue to follow-up less review recheck for symptoms in about a year.  With possible mitral and tricuspid valver regurgitation, will need to get the echocardiogram to review. (On review there was moderate MR and moderate TR. At present, without any significant heart failure symptoms, we have to presume the patient remained stable.Marland Kitchen  \He is on high dose carvedilol as well as clonidine. I question whether or not she should be on a calcium channel blocker for potential anginal effect as well as blood pressure control.  I need to have the echo in order to review. So we have sent out birthday flier to the [place] office.  Would simply continue to follow based on symptoms and once should easily become more stable       Other Visit Diagnoses   None.     Current medicines are reviewed at length with the patient today. (+/- concerns)  The following changes have been made:   Patient Instructions  NO CHANGES AT [place] ECHO REPORT   Your physician recommends that you schedule a follow-up appointment in:  [place] [address] [place] Welcome - 30 MINS    If you need a refill on your cardiac medications before your next appointment, please call your pharmacy.     Studies Ordered:   Orders Placed This Encounter  Procedures  . EKG 12-Lead      Glenetta Hew, M.D., M.S. Interventional Cardiologist   Pager # [phone] Phone # [phone]  [address] [place], [place] [address]

## 2016-02-23 NOTE — Therapy (Addendum)
[place], [place], [address] Phone: [phone]   Fax:  [phone]  Physical Therapy Treatment/Discharge  Patient Details  Name: Grisela Mesch MRN: [id number] Date of Birth: 05-18-1975 Referring Provider: Enid Skeens, MD  Encounter Date: 02/23/2016      PT End of Session - 02/23/16 1248    Visit Number 5   Number of Visits 9   Date for PT Re-Evaluation 03/04/16   Authorization Type medicare/BCBS   PT Start Time 1151   PT Stop Time 1225   PT Time Calculation (min) 34 min   Equipment Utilized During Treatment Other (comment)  SIJ belt   Activity Tolerance Patient tolerated treatment well   Behavior During Therapy [facility] for tasks assessed/performed      Past Medical History:  Diagnosis Date  . Anxiety   . Complication of anesthesia    woke up before they took the endotra. tube, once was slow to wake up  . ESRD (end stage renal disease) on dialysis [facility])    "TTS; [place] [facility]" (09/14/2015)  . GERD (gastroesophageal reflux disease)   . Headache    "after dialysis" (09/14/2015)  . Heart murmur   . Hyperparathyroidism due to renal insufficiency ([place])   . Hypertension   . Iron deficiency anemia   . Migraine    "a few/year": (09/14/2015)  . Shortness of breath dyspnea    with exertion  . 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 UTI (lower urinary tract infection)    recurrent    Past Surgical History:  Procedure Laterality Date  . ABDOMINAL HYSTERECTOMY  2006  . AV FISTULA PLACEMENT Left 01/27/2015   Procedure:  BASILIC VEIN TRANSPOSITION ;  Surgeon: Angelia Mould, MD;  Location: [facility];  Service: Vascular;  Laterality: Left;  . DILATION AND CURETTAGE OF UTERUS    . LAPAROSCOPIC CHOLECYSTECTOMY  2005  . SHOULDER ARTHROSCOPY W/ ROTATOR CUFF REPAIR Left 2012  . TUBAL LIGATION  2003    There were no vitals filed for this visit.      Subjective Assessment  - 02/23/16 1247    Subjective severe pain since last visit in R hamstring and pelvis.                          [place] Adult PT Treatment/Exercise - 02/23/16 0001      Manual Therapy   Manual therapy comments manual correction of pelvic rotation                PT Education - 02/23/16 1247    Education provided Yes   Education Details SIJ belt, anatomy of condition   Person(s) Educated Patient   Methods Explanation   Comprehension Need further instruction          PT Short Term Goals - 02/04/16 1701      PT SHORT TERM GOAL #1   Title Pt will be able to sit through dialysis treatments LBP <=3/10 by 10/27   Baseline severe pain at eval   Time 4   Period Weeks   Status New     PT SHORT TERM GOAL #2   Title Pt will be able to climb stairs at home & in the community step over step without increase in back pain   Baseline unable at eval   Time 4   Period Weeks   Status New     PT SHORT TERM  GOAL #3   Title FOTO to 52% ability to indicate significant improvement in functional ability   Baseline 36% ability at eval   Time 4   Period Weeks   Status New     PT SHORT TERM GOAL #4   Title Pt will be able to perform all self care activities such as bathing and dressing without increase in LBP.    Baseline severe pain at eval   Time 4   Period Weeks   Status New                  Plan - 02/23/16 1249    Clinical Impression Statement Notable R post/L anterior pelvic rotation today. Unable to correct with MET but easily able to manually correct indicating excessive mobility and lack of strength support by surrounding musculature. Placed SIJ belt on the pt today which significantly decreased pain and was able to walk around clinic for 10 mintues without pain. Pt verbalized pelvic pain with intercourse and I believe she would benefit from treatment from pelvic floor PT specialist.    PT Treatment/Interventions ADLs/Self Care Home  Management;Cryotherapy;Electrical Stimulation;Functional mobility training;Stair training;Gait training;Moist Heat;Therapeutic activities;Therapeutic exercise;Traction;Ultrasound;Balance training;Neuromuscular re-education;Patient/family education;Passive range of motion;Manual techniques;Taping   PT Next Visit Plan lumbo pelvic stability   PT Home Exercise Plan hooklying ball squeeze, pelvic tilt, lumbar pillow & avoid crossing legs   Consulted and Agree with Plan of Care Patient      Patient will benefit from skilled therapeutic intervention in order to improve the following deficits and impairments:  Difficulty walking, Increased muscle spasms, Decreased activity tolerance, Pain, Improper body mechanics, Postural dysfunction  Visit Diagnosis: Acute left-sided low back pain without sciatica  Difficulty in walking, not elsewhere classified     Problem List Patient Active Problem List   Diagnosis Date Noted  . Malnutrition of moderate degree 11/17/2015  . Nausea with vomiting 11/16/2015  . Volume overload 11/16/2015  . HTN (hypertension), benign   . ESRD on dialysis ([place])    Carmilla Granville C. Zorion Nims PT, DPT 02/23/16 12:55 PM   [facility] [address] [place], [place], [address] Phone: [phone]   Fax:  [phone]  Name: Haylin Camilli MRN: [id number] Date of Birth: 03-02-1976  PHYSICAL THERAPY DISCHARGE SUMMARY  Visits from Start of Care: 5  Current functional level related to goals / functional outcomes: See above   Remaining deficits: See above   Education / Equipment: Anatomy of condition, POC, HEP, exercise form/rationale  Plan: Patient agrees to discharge.  Patient goals were not met. Patient is being discharged due to a change in medical status.  ?????   Jacoba Cherney C. Johnthomas Lader PT, DPT 05/04/16 2:22 PM

## 2016-02-23 NOTE — Patient Instructions (Signed)
NO CHANGES AT PRESENT  WILL CONTACT [facility] FOR ECHO REPORT   Your physician recommends that you schedule a follow-up appointment in:  [place] [address] [facility] - 30 MINS    If you need a refill on your cardiac medications before your next appointment, please call your pharmacy.

## 2016-02-24 ENCOUNTER — Telehealth: Payer: Self-pay | Admitting: Cardiology

## 2016-02-24 NOTE — Telephone Encounter (Signed)
Faxed  Release  (signed by patient) to [facility] to obtain records per Dr Ellyn Hack.  Faxed on 02/24/16. lp

## 2016-02-25 ENCOUNTER — Ambulatory Visit: Payer: Medicare Other | Admitting: Physical Therapy

## 2016-02-25 ENCOUNTER — Encounter: Payer: Self-pay | Admitting: Cardiology

## 2016-02-25 DIAGNOSIS — R931 Abnormal findings on diagnostic imaging of heart and coronary circulation: Secondary | ICD-10-CM | POA: Insufficient documentation

## 2016-02-25 NOTE — Progress Notes (Signed)
[place], [place]. [address] Transthoracic Echocardiogram Report  Name: Valerie Yates, Valerie Yates Study Date: 04/09/2014 Height: 4 in MRN: [id number] Weight: 173 lb DOB: 1976/02/02 Gender: Female BSA: 1.9 m2 Age: 40 yrsEthnicity: Caucasian BP: 124/73 mmHg Reason For Study: Pre-transplant evaluation for kidney transplant  PROCEDURE Study Quality: Fair. - SUMMARY The left ventricular size is normal. There is normal left ventricular wall thickness.  Left ventricular systolic function is low normal. LV ejection fraction = 50-55%. The right ventricle is normal in size and function. Structurally normal aortic valve. The aortic valve opens well. The mitral valve leaflets appear normal. There is trace mitral regurgitation. Structurally normal tricuspid valve. There is trace tricuspid regurgitation. There is no pericardial effusion. There is no comparison study available. - FINDINGS:  LEFT VENTRICLE The left ventricular size is normal. There is normal left ventricular wall  thickness. Left ventricular systolic function is low normal. LV ejection  fraction = 50-55%. Left ventricular filling pattern is impaired. The left  ventricular wall motion is normal. -  RIGHT VENTRICLE The right ventricle is normal in size and function.  LEFT ATRIUM The left atrium is mildly dilated.  RIGHT ATRIUM  Right atrial size is normal. - AORTIC VALVE Structurally normal aortic valve. The aortic valve is trileaflet. The aortic  valve opens well. No aortic regurgitation is present. - MITRAL VALVE The mitral valve leaflets appear normal. There is trace mitral regurgitation. - TRICUSPID VALVE Structurally normal tricuspid valve. There is trace tricuspid regurgitation. - PULMONIC VALVE Structurally normal pulmonic valve. Trace pulmonic valvular regurgitation. - ARTERIES The aortic root is normal size. - VENOUS Pulmonary  venous flow pattern is normal. IVC size was normal. - EFFUSION There is no pericardial effusion. - - MMode/2D Measurements & Calculations IVSd: 0.79 cm LVIDd: 5.0 cm LVPWd: 0.84 cm LVIDs: 3.7 cm LA dim: 4.1 cm Ao root: 3.1 cm RVDd: 3.1 cm EDV(MOD-sp4): 77.1 ml ESV(MOD-sp4): 36.1 ml asc Aorta Diam: 3.7 cm LVOT diam: 2.0 cm LA area A2: 15.8 cm2 LA area A4: 17.4 cm2 LA length (vol): 5.6 cm LA vol: 41.5 ml LA vol index: 22.1 ml/m2 RA area A4: 12.5 cm2 Doppler Measurements & Calculations MV E max vel: 69.4 cm/sec MV A max vel: 69.4 cm/sec MV E/A: 1.0 Med Peak E' Vel: 5.1 cm/sec Lat Peak E' Vel: 7.5 cm/sec E/Lat E`: 9.2 E/Med E`: 13.6 MV dec time: 0.19 sec SV(LVOT): 62.3 ml Ao max PG: 5.0 mmHg Ao mean PG: 2.8 mmHg Ao V2 VTI: 28.9 cm AVA (VTI): 2.2 cm2 LV V1 VTI: 20.1 cm RV Peak E' Vel: 9.0 cm/sec   Reading Physician: [place], [id number] 04/09/2014 09:31 AM   NUCLEAR MYOCARDIAL PERFUSION IMAGING WITH SPECT Aug 29, 2014 11:37:57 AM . INDICATION:Z01.818 Pre-transplant evaluation for ESRD (end stage renal disease)  COMPARISON: Chest radiograph dated 08/29/2014. Marland Kitchen TECHNIQUE: A LexiScan stress protocol was used.14 mCi of Tc46m Myoview was administered intravenously at rest and 43 mCi at stress.SPECT images were obtained. LIMITATIONS: Large amount of extracardiac activity in the bowel, limits optimal visualization of detail. However, this exam was felt to be interpretable. Marland Kitchen FINDINGS: .Physiological distribution of the radiotracer .No unexpected findings on raw images .No transient ischemic dilatation. .No fixed or transient perfusion defects at stress or rest. .On SPECT .Ejection fraction : Greater than 70 % .Wall motion abnormalities: None  .Background activity: Normal

## 2016-02-25 NOTE — Assessment & Plan Note (Addendum)
Unfortunately, at the time of this visit, do not have the echo to review.  Complete evaluation of the echocardiogram showed moderate MR and TR with mildly reduced EF, presumably related to hypertension. Also noted elevated RV pressures of 52 mmHg systolic.  Based on findings and doesn't goes, I don't think we need to continue to follow-up less review recheck for symptoms in about a year.  With possible mitral and tricuspid valver regurgitation, will need to get the echocardiogram to review. (On review there was moderate MR and moderate TR. At present, without any significant heart failure symptoms, we have to presume the patient remained stable.Marland Kitchen  \He is on high dose carvedilol as well as clonidine. I question whether or not she should be on a calcium channel blocker for potential anginal effect as well as blood pressure control.  I need to have the echo in order to review. So we have sent out birthday flier to the [place] office.  Would simply continue to follow based on symptoms and once should easily become more stable

## 2016-02-25 NOTE — Assessment & Plan Note (Signed)
Blood pressure is quite high today on significant medications. I question whether or not she could benefit from a calcium channel blocker. Will defer to nephrology.

## 2016-02-25 NOTE — Assessment & Plan Note (Addendum)
Not really sure what to make of the dizziness, it may just be related to her hypertension.  She is on a very interesting and evidence of regimen there is been established by her nephrologist. Will defer to the coach to treat post injury replacements more aggressively.

## 2016-03-01 ENCOUNTER — Ambulatory Visit: Payer: Medicare Other | Admitting: Physical Therapy

## 2016-03-03 ENCOUNTER — Ambulatory Visit: Payer: Medicare Other | Admitting: Physical Therapy

## 2016-03-15 ENCOUNTER — Ambulatory Visit: Payer: Medicare Other | Attending: Family Medicine | Admitting: Physical Therapy

## 2016-05-15 ENCOUNTER — Emergency Department (HOSPITAL_COMMUNITY)
Admission: EM | Admit: 2016-05-15 | Discharge: 2016-05-16 | Disposition: A | Discharge status: Home / Self Care | Payer: Medicare Other | Attending: Emergency Medicine | Admitting: Emergency Medicine

## 2016-05-15 ENCOUNTER — Emergency Department (HOSPITAL_COMMUNITY): Payer: Medicare Other

## 2016-05-15 ENCOUNTER — Encounter (HOSPITAL_COMMUNITY): Payer: Self-pay

## 2016-05-15 DIAGNOSIS — R0602 Shortness of breath: Secondary | ICD-10-CM | POA: Insufficient documentation

## 2016-05-15 DIAGNOSIS — I120 Hypertensive chronic kidney disease with stage 5 chronic kidney disease or end stage renal disease: Secondary | ICD-10-CM | POA: Diagnosis not present

## 2016-05-15 DIAGNOSIS — N186 End stage renal disease: Secondary | ICD-10-CM | POA: Diagnosis not present

## 2016-05-15 DIAGNOSIS — R079 Chest pain, unspecified: Secondary | ICD-10-CM | POA: Insufficient documentation

## 2016-05-15 DIAGNOSIS — Z992 Dependence on renal dialysis: Secondary | ICD-10-CM | POA: Insufficient documentation

## 2016-05-15 DIAGNOSIS — I10 Essential (primary) hypertension: Secondary | ICD-10-CM

## 2016-05-15 LAB — BASIC METABOLIC PANEL
ANION GAP: 16 — AB (ref 5–15)
BUN: 44 mg/dL — ABNORMAL HIGH (ref 6–20)
CALCIUM: 9.3 mg/dL (ref 8.9–10.3)
CO2: 23 mmol/L (ref 22–32)
Chloride: 98 mmol/L — ABNORMAL LOW (ref 101–111)
Creatinine, Ser: 8.3 mg/dL — ABNORMAL HIGH (ref 0.44–1.00)
GFR, EST AFRICAN AMERICAN: 6 mL/min — AB (ref >60)
GFR, EST NON AFRICAN AMERICAN: 5 mL/min — AB (ref >60)
Glucose, Bld: 89 mg/dL (ref 65–99)
Potassium: 4.8 mmol/L (ref 3.5–5.1)
Sodium: 137 mmol/L (ref 135–145)

## 2016-05-15 LAB — CBC
HCT: 29.7 % — ABNORMAL LOW (ref 36.0–46.0)
HEMOGLOBIN: 9.9 g/dL — AB (ref 12.0–15.0)
MCH: 33.9 pg (ref 26.0–34.0)
MCHC: 33.3 g/dL (ref 30.0–36.0)
MCV: 101.7 fL — ABNORMAL HIGH (ref 78.0–100.0)
Platelets: 168 10*3/uL (ref 150–400)
RBC: 2.92 MIL/uL — AB (ref 3.87–5.11)
RDW: 16.1 % — ABNORMAL HIGH (ref 11.5–15.5)
WBC: 6 10*3/uL (ref 4.0–10.5)

## 2016-05-15 LAB — I-STAT TROPONIN, ED: TROPONIN I, POC: 0.01 ng/mL (ref 0.00–0.08)

## 2016-05-15 LAB — BRAIN NATRIURETIC PEPTIDE

## 2016-05-15 MED ORDER — LISINOPRIL 10 MG PO TABS
10.0000 mg | ORAL_TABLET | Freq: Once | ORAL | Status: AC
  Administered 2016-05-15: 10 mg via ORAL
  Filled 2016-05-15: qty 1

## 2016-05-15 MED ORDER — CARVEDILOL 12.5 MG PO TABS
25.0000 mg | ORAL_TABLET | Freq: Once | ORAL | Status: AC
  Administered 2016-05-15: 25 mg via ORAL
  Filled 2016-05-15: qty 2

## 2016-05-15 MED ORDER — FENTANYL CITRATE (PF) 100 MCG/2ML IJ SOLN
25.0000 ug | Freq: Once | INTRAMUSCULAR | Status: AC
  Administered 2016-05-15: 25 ug via INTRAVENOUS
  Filled 2016-05-15: qty 2

## 2016-05-15 MED ORDER — ONDANSETRON HCL 4 MG/2ML IJ SOLN
4.0000 mg | Freq: Once | INTRAMUSCULAR | Status: AC
  Administered 2016-05-15: 4 mg via INTRAVENOUS
  Filled 2016-05-15: qty 2

## 2016-05-15 NOTE — ED Notes (Signed)
Patient ambulatory to restroom with steady gait.

## 2016-05-15 NOTE — ED Triage Notes (Signed)
Pt complaining of central chest tightness and SOB x 3 days. Pt states chest pain radiates to back. Pt states dialysis MWF, states full treatment this past Friday. Pt also complaining of dry cough.

## 2016-05-15 NOTE — ED Notes (Signed)
Pt c/o L sided CP radiating around to back onset 4 days ago during hemodialysis. Pt is M/W/F, Pt states increased weakness, shob and nausea with 1 episode of emesis today.  A & O, NAD. Pt does become SHOB with continuous talking.

## 2016-05-15 NOTE — ED Notes (Signed)
Pt states that her Renal MD told her to come to the ED if she experiences chest tightness and shortness of breath.

## 2016-05-15 NOTE — ED Provider Notes (Signed)
[facility] DEPT Provider Note   CSN: [id number] Arrival date & time: 05/15/16  1925     History   Chief Complaint Chief Complaint  Patient presents with  . Chest Pain  . Shortness of Breath  . Cough    HPI Valerie Yates is a 41 y.o. female.  41 y/o female with hx of HTN, hyperparathyroidism, ESRD on MWF dialysis (still making urine), GERD, and heart murmur presents to the ED for evaluation of chest pain. Patient states that she has had a pain in her left chest which is radiating through to her back. She states, "It feels like I was punched in the chest". This has been worsening since the morning today. She reports having similar symptoms with it dialysis on Friday, but these symptoms resolved for most of the weekend. She developed a dry cough yesterday which progressed to repeat onset of chest pain today. The patient denies taking any medications for symptoms. She has also had associated pain in her neck "like being choked". This neck pain does not communicate with her chest pain but is present at the same time. She has had no difficulty swallowing. She reports sporadic vomiting today without nausea. She c/o mild-moderate SOB. No leg swelling. No fevers or abdominal pain. She was fully dialyzed on Friday.   TEE shows LVEF of 50-55% in October 2017   The history is provided by the patient. No language interpreter was used.  Chest Pain   Associated symptoms include cough and shortness of breath.  Shortness of Breath  Associated symptoms include cough and chest pain.  Cough  Associated symptoms include chest pain and shortness of breath.    Past Medical History:  Diagnosis Date  . Anxiety   . Complication of anesthesia    woke up before they took the endotra. tube, once was slow to wake up  . ESRD (end stage renal disease) on dialysis [facility])    "TTS; [facility]" (09/14/2015)  . GERD (gastroesophageal reflux disease)   . Headache    "after dialysis" (09/14/2015)  . Heart murmur     . Hyperparathyroidism due to renal insufficiency ([place])   . Hypertension   . Iron deficiency anemia   . Migraine    "a few/year": (09/14/2015)  . Shortness of breath dyspnea    with exertion  . 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 UTI (lower urinary tract infection)    recurrent    Patient Active Problem List   Diagnosis Date Noted  . Abnormal echocardiogram findings without diagnosis 02/25/2016  . Malnutrition of moderate degree 11/17/2015  . Nausea with vomiting 11/16/2015  . Poorly-controlled hypertension     Class: Stage 1  . ESRD on dialysis [facility])     Past Surgical History:  Procedure Laterality Date  . ABDOMINAL HYSTERECTOMY  2006  . AV FISTULA PLACEMENT Left 01/27/2015   Procedure:  BASILIC VEIN TRANSPOSITION ;  Surgeon: Angelia Mould, MD;  Location: [facility];  Service: Vascular;  Laterality: Left;  . DILATION AND CURETTAGE OF UTERUS    . LAPAROSCOPIC CHOLECYSTECTOMY  2005  . NM MYOVIEW LTD  08/2014   No fixed or reversible perfusion defect - to suggest Infarct or Ischeima.. EF ~70%.    Marland Kitchen SHOULDER ARTHROSCOPY W/ ROTATOR CUFF REPAIR Left 2012  . TRANSTHORACIC ECHOCARDIOGRAM  04/2014   Low normal EF of 50-55%. No RWMA.  Tr MR/TR.  NORMAL  . TUBAL LIGATION  2003    OB History  No data available       Home Medications    Prior to Admission medications   Medication Sig Start Date End Date Taking? Authorizing Provider  acetaminophen (TYLENOL) 500 MG tablet Take 1,000 mg by mouth every 6 (six) hours as needed for mild pain.   Yes Historical Provider, MD  ALPRAZolam Duanne Moron) 0.5 MG tablet Take 0.5 mg by mouth 3 (three) times daily as needed for anxiety.   Yes Historical Provider, MD  anti-nausea (EMETROL) solution Take 15 mLs by mouth every 15 (fifteen) minutes as needed for nausea or vomiting.   Yes Historical Provider, MD  carvedilol (COREG) 25 MG tablet Take 1 tablet by mouth every 6 (six) hours.  10/26/15  Yes Historical  Provider, MD  cloNIDine (CATAPRES - DOSED IN MG/24 HR) 0.3 mg/24hr patch Place 1 patch onto the skin once a week. 11/04/15  Yes Historical Provider, MD  cloNIDine HCl (KAPVAY) 0.1 MG TB12 ER tablet Take 0.1 mg by mouth as needed (For blood pressure >166).    Yes Historical Provider, MD  lisinopril (PRINIVIL,ZESTRIL) 10 MG tablet Take 10 mg by mouth daily. 05/14/16  Yes Historical Provider, MD  oxyCODONE-acetaminophen (PERCOCET/ROXICET) 5-325 MG tablet Take 1 tablet by mouth every 6 (six) hours as needed for severe pain.   Yes Historical Provider, MD  RENVELA 800 MG tablet Take 1,600 mg by mouth 3 (three) times daily before meals. 09/07/15  Yes Historical Provider, MD    Family History Family History  Problem Relation Age of Onset  . Diabetes Maternal Grandmother   . Cancer Mother     Breast  . Heart disease Maternal Grandfather     Social History Social History  Substance Use Topics  . Smoking status: Never Smoker  . Smokeless tobacco: Never Used  . Alcohol use No     Allergies   Azithromycin; Erythromycin; Penicillins; and Morphine sulfate   Review of Systems Review of Systems  Respiratory: Positive for cough and shortness of breath.   Cardiovascular: Positive for chest pain.  Ten systems reviewed and are negative for acute change, except as noted in the HPI.     Physical Exam Updated Vital Signs BP (!) 162/104   Pulse 84   Temp 98.7 F (37.1 C) (Oral)   Resp 24   SpO2 100%   Physical Exam  Constitutional: She is oriented to person, place, and time. She appears well-developed and well-nourished. No distress.  Nontoxic and in NAD  HENT:  Head: Normocephalic and atraumatic.  Eyes: Conjunctivae and EOM are normal. No scleral icterus.  Neck: Normal range of motion.  Cardiovascular: Normal rate, regular rhythm and intact distal pulses.   Murmur heard. Pulmonary/Chest: Effort normal. No respiratory distress. She has no wheezes. She has no rales.  Mild dyspnea without  tachypnea. Lungs grossly clear. No wheezes or rales. Chest expansion symmetric.  Abdominal: Soft. She exhibits no distension. There is no tenderness. There is no guarding.  Musculoskeletal: Normal range of motion.  No lower extremity pitting edema. Good palpable thrill to left AV fistula.  Neurological: She is alert and oriented to person, place, and time. She exhibits normal muscle tone. Coordination normal.  Skin: Skin is warm and dry. No rash noted. She is not diaphoretic. No erythema. No pallor.  Psychiatric: Her behavior is normal. Her mood appears anxious (mild).  Nursing note and vitals reviewed.    ED Treatments / Results  Labs (all labs ordered are listed, but only abnormal results are displayed) Labs Reviewed  BASIC  METABOLIC PANEL - Abnormal; Notable for the following:       Result Value   Chloride 98 (*)    BUN 44 (*)    Creatinine, Ser 8.30 (*)    GFR calc non Af Amer 5 (*)    GFR calc Af Amer 6 (*)    Anion gap 16 (*)    All other components within normal limits  CBC - Abnormal; Notable for the following:    RBC 2.92 (*)    Hemoglobin 9.9 (*)    HCT 29.7 (*)    MCV 101.7 (*)    RDW 16.1 (*)    All other components within normal limits  BRAIN NATRIURETIC PEPTIDE - Abnormal; Notable for the following:    B Natriuretic Peptide >4,500.0 (*)    All other components within normal limits  I-STAT TROPOININ, ED    EKG  EKG Interpretation  Date/Time:  Sunday May 15 2016 19:31:50 EST Ventricular Rate:  81 PR Interval:  146 QRS Duration: 74 QT Interval:  416 QTC Calculation: 483 R Axis:   69 Text Interpretation:  Normal sinus rhythm Moderate voltage criteria for LVH, may be normal variant Nonspecific ST and T wave abnormality Prolonged QT Abnormal ECG No significant change since last tracing Confirmed by KNAPP  MD-J, JON ([id number]) on 05/15/2016 11:09:34 PM       Radiology Dg Chest 2 View  Result Date: 05/15/2016 CLINICAL DATA:  Shortness of breath and chest  pain.  Renal failure. EXAM: CHEST  2 VIEW COMPARISON:  November 16, 2015 FINDINGS: There is no edema or consolidation. No appreciable pleural effusions. There is cardiomegaly with pulmonary venous hypertension. No adenopathy. No bone lesions. IMPRESSION: Pulmonary vascular congestion without frank edema or consolidation. Electronically Signed   By: William  Woodruff III M.D.   On: 05/15/2016 20:05    Procedures Procedures (including critical care time)  Medications Ordered in ED Medications  fentaNYL (SUBLIMAZE) injection 25 mcg (25 mcg Intravenous Given 05/15/16 2238)  ondansetron (ZOFRAN) injection 4 mg (4 mg Intravenous Given 05/15/16 2237)     Initial Impression / Assessment and Plan / ED Course  I have reviewed the triage vital signs and the nursing notes.  Pertinent labs & imaging results that were available during my care of the patient were reviewed by me and considered in my medical decision making (see chart for details).  Clinical Course     11 :45 PM Patient reassessed. She states that she is feeling better. Nighttime blood pressure medication ordered for hypertension. Patient does have evidence of vascular congestion on her chest x-ray. This does correlate with BNP. However, I suspect that this is due to patients need to be dialyzed. She is scheduled for dialysis at her Center in approximately 6 hours. Congestion is not impacting patient's lung sounds or oxygen saturations. No tachycardia or hypoxia. Low suspicion for PE; PERC negative. Initial troponin is reassuring. Plan for delta. Anticipate the patient will be able to follow-up with Dr. Ellyn Hack on an outpatient basis if her delta troponin is negative. Will continue to monitor.  1:05 AM Delta troponin negative. VSS; blood pressure has improved since nightly meds given. Plan for outpatient cardiology and nephrology follow up. Patient advised to present to dialysis as scheduled. Return precautions provided. Patient discharged in stable  condition with no unaddressed concerns.   Final Clinical Impressions(s) / ED Diagnoses   Final diagnoses:  Chest pain, unspecified type  Hypertension not at goal    New Prescriptions New Prescriptions  No medications on file     Antonietta Breach, PA-C 05/16/16 [id number]    Dorie Rank, MD 05/17/16 [phone]

## 2016-05-16 DIAGNOSIS — R079 Chest pain, unspecified: Secondary | ICD-10-CM | POA: Diagnosis not present

## 2016-05-16 LAB — I-STAT TROPONIN, ED: Troponin i, poc: 0 ng/mL (ref 0.00–0.08)

## 2016-05-16 NOTE — ED Notes (Signed)
Patient verbalized understanding of discharge instructions and denies any further needs or questions at this time. VS stable. Patient ambulatory with steady gait. RN escorted to ED entrance in wheelchair.

## 2016-05-16 NOTE — Discharge Instructions (Signed)
Your workup today suggests some fluid congestion which will likely resolve at your dialysis session today. Your heart enzyme is reassuring and your EKG does not show any changes compared to prior workups. We advise you discuss your chest pain with your cardiologist. Present for dialysis today, as scheduled. You may take the Percocet previously prescribed to you, as needed, for persistent pain. Continue taking your daily blood pressure medications. Return to the emergency department for new or concerning symptoms.

## 2016-05-26 ENCOUNTER — Ambulatory Visit: Payer: Medicare Other | Admitting: Cardiology

## 2016-06-01 ENCOUNTER — Encounter: Payer: Self-pay | Admitting: Gastroenterology

## 2016-06-01 ENCOUNTER — Ambulatory Visit (INDEPENDENT_AMBULATORY_CARE_PROVIDER_SITE_OTHER): Payer: Medicare Other | Admitting: Gastroenterology

## 2016-06-01 VITALS — BP 152/90 | HR 76 | Ht 67.0 in | Wt 113.8 lb

## 2016-06-01 DIAGNOSIS — R194 Change in bowel habit: Secondary | ICD-10-CM

## 2016-06-01 DIAGNOSIS — R110 Nausea: Secondary | ICD-10-CM | POA: Diagnosis not present

## 2016-06-01 DIAGNOSIS — R634 Abnormal weight loss: Secondary | ICD-10-CM | POA: Diagnosis not present

## 2016-06-01 DIAGNOSIS — R1110 Vomiting, unspecified: Secondary | ICD-10-CM

## 2016-06-01 MED ORDER — ONDANSETRON HCL 4 MG PO TABS: 4.0000 mg | ORAL_TABLET | Freq: Three times a day (TID) | ORAL | 3 refills | Status: DC

## 2016-06-01 MED ORDER — OMEPRAZOLE 40 MG PO CPDR: 40.0000 mg | DELAYED_RELEASE_CAPSULE | Freq: Every day | ORAL | 3 refills | Status: DC

## 2016-06-01 MED ORDER — NA SULFATE-K SULFATE-MG SULF 17.5-3.13-1.6 GM/177ML PO SOLN: 1.0000 | Freq: Once | ORAL | 0 refills | Status: AC

## 2016-06-01 NOTE — Patient Instructions (Signed)
If you are age 41 or older, your body mass index should be between 23-30. Your Body mass index is 17.82 kg/m. If this is out of the aforementioned range listed, please consider follow up with your Primary Care Provider.  If you are age 74 or younger, your body mass index should be between 19-25. Your Body mass index is 17.82 kg/m. If this is out of the aformentioned range listed, please consider follow up with your Primary Care Provider.   We have sent the following medications to your pharmacy for you to pick up at your convenience:  Omeprazole  Zofran  Please use Immodium as needed per Dr Havery Moros.  You have been scheduled for an endoscopy and colonoscopy. Please follow the written instructions given to you at your visit today. Please pick up your prep supplies at the pharmacy within the next 1-3 days. If you use inhalers (even only as needed), please bring them with you on the day of your procedure. Your physician has requested that you go to www.startemmi.com and enter the access code given to you at your visit today. This web site gives a general overview about your procedure. However, you should still follow specific instructions given to you by our office regarding your preparation for the procedure.  Thank you.

## 2016-06-01 NOTE — Progress Notes (Signed)
HPI :  41 y/o female with history of ESRD on HD, chronic anemia, anxiety, here for evaluation for weight loss, chronic nausea, and changes in bowel habits.   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 She has been on dialysis for a little over a year and is on wait list for renal transplant. She has lost about 85 lbs in the past year, not been trying to lose weight. She reports still losing and has been an ongoing process. She thought this was due to dialysis initially but much more than would expect with HD.   She has had change in stools over the past year. Often loose stool, which can fluctuate, sometimes she has constipation. She thinks loose stools more common for her, 60% of the time. Usually has a bowel movement after she eats with some urgency. She has nausea and vomiting as well. She is nauseated multiple times per week. Nausea is sporadic, but often worse after she eats. She has tried zofran, sometimes works. She has been on phenergan in the past as well. She is vomiting multiple times per week. She tries to snack throughout the day, does not have any large sitdown meals. She does not think she is eating well due to nausea, she thinks 1/2 normal caloric intake of what she normally eats. She has epigastric pains which bother her along with this. She states this bothers her after she eats some of the time and when she vomits. She tries to eat bland foods. No blood in the stools but endorses hemorrhoids. She thinks on average 4-5 bowel movements per day at times. She is s/p cholecystectomy.  She has taken some immodium, which has provided some benefit in the past. She is taking percocet once every 6 hours for chronic SI joint pain. She reports taking it once every 6-12 hours. She has been on this for a few months now. She denies any NSAIDs. She has taken OTC zantac for heartburn, she did not think it helped too much.   She denies any prior history of endoscopy or  colonoscopy. She's had a noncontrast abdomen CT Scan on 11/17/15 for this issue which did not show anything obvious for her symptoms. She's also had a CT scan in May 2017 which was normal. She is on the transplant list for her kidney disease.   Echo 10/27/15 - EF 45-50%, moderate MR and TR  Past Medical History:  Diagnosis Date  . Anxiety   . Complication of anesthesia    woke up before they took the endotra. tube, once was slow to wake up  . ESRD (end stage renal disease) on dialysis [facility])    "TTS; [place] [facility]" (09/14/2015)  . GERD (gastroesophageal reflux disease)   . Headache    "after dialysis" (09/14/2015)  . Heart murmur   . Hyperparathyroidism due to renal insufficiency ([place])   . Hypertension   . Iron deficiency anemia   . Migraine    "a few/year": (09/14/2015)  . Shortness of breath dyspnea    with exertion  . 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 UTI (lower urinary tract infection)    recurrent     Past Surgical History:  Procedure Laterality Date  . ABDOMINAL HYSTERECTOMY  2006  . AV FISTULA PLACEMENT Left 01/27/2015   Procedure:  BASILIC VEIN TRANSPOSITION ;  Surgeon: Angelia Mould, MD;  Location: [facility];  Service: Vascular;  Laterality:  Left;  . DILATION AND CURETTAGE OF UTERUS    . LAPAROSCOPIC CHOLECYSTECTOMY  2005  . NM MYOVIEW LTD  08/2014   No fixed or reversible perfusion defect - to suggest Infarct or Ischeima.. EF ~70%.    Marland Kitchen SHOULDER ARTHROSCOPY W/ ROTATOR CUFF REPAIR Left 2012  . TRANSTHORACIC ECHOCARDIOGRAM  04/2014   Low normal EF of 50-55%. No RWMA.  Tr MR/TR.  NORMAL  . TUBAL LIGATION  2003   Family History  Problem Relation Age of Onset  . Diabetes Maternal Grandmother   . Cancer Mother     Breast  . Heart disease Maternal Grandfather    Social History  Substance Use Topics  . Smoking status: Never Smoker  . Smokeless tobacco: Never Used  . Alcohol use No   Current Outpatient Prescriptions    Medication Sig Dispense Refill  . acetaminophen (TYLENOL) 500 MG tablet Take 1,000 mg by mouth every 6 (six) hours as needed for mild pain.    Marland Kitchen ALPRAZolam (XANAX) 0.5 MG tablet Take 0.5 mg by mouth 3 (three) times daily as needed for anxiety.    Marland Kitchen anti-nausea (EMETROL) solution Take 15 mLs by mouth every 15 (fifteen) minutes as needed for nausea or vomiting.    . carvedilol (COREG) 25 MG tablet Take 1 tablet by mouth every 6 (six) hours.   6  . cloNIDine (CATAPRES - DOSED IN MG/24 HR) 0.3 mg/24hr patch Place 1 patch onto the skin once a week.  5  . cloNIDine HCl (KAPVAY) 0.1 MG TB12 ER tablet Take 0.1 mg by mouth as needed (For blood pressure >166).     Marland Kitchen lisinopril (PRINIVIL,ZESTRIL) 10 MG tablet Take 10 mg by mouth daily.  6  . oxyCODONE-acetaminophen (PERCOCET/ROXICET) 5-325 MG tablet Take 1 tablet by mouth every 6 (six) hours as needed for severe pain.    Marland Kitchen RENVELA 800 MG tablet Take 1,600 mg by mouth 3 (three) times daily before meals.  6   No current facility-administered medications for this visit.    Allergies  Allergen Reactions  . Azithromycin Anaphylaxis and Rash  . Erythromycin Hives, Shortness Of Breath and Rash    Acne Aid-Dermatological.  . Penicillins Hives and Shortness Of Breath    Has patient had a PCN reaction causing immediate rash, facial/tongue/throat swelling, SOB or lightheadedness with hypotension: Yes Has patient had a PCN reaction causing severe rash involving mucus membranes or skin necrosis: Yes Has patient had a PCN reaction that required hospitalization Yes Has patient had a PCN reaction occurring within the last 10 years: No If all of the above answers are "NO", then may proceed with Cephalosporin use.   Marland Kitchen Morphine Sulfate Rash    Concentrate-Analgesics-Opioid      Review of Systems: All systems reviewed and negative except where noted in HPI.   Lab Results  Component Value Date   WBC 6.0 05/15/2016   HGB 9.9 (L) 05/15/2016   HCT 29.7 (L)  05/15/2016   MCV 101.7 (H) 05/15/2016   PLT 168 05/15/2016    Lab Results  Component Value Date   CREATININE 8.30 (H) 05/15/2016   BUN 44 (H) 05/15/2016   NA 137 05/15/2016   K 4.8 05/15/2016   CL 98 (L) 05/15/2016   CO2 23 05/15/2016    Lab Results  Component Value Date   ALT 11 (L) 11/16/2015   AST 17 11/16/2015   ALKPHOS 62 11/16/2015   BILITOT 0.9 11/16/2015      Physical Exam: BP (!) 152/90  Pulse 76   Ht 5\' 7"  (1.702 m)   Wt 113 lb 12.8 oz (51.6 kg)   BMI 17.82 kg/m  Constitutional: Pleasant, thin, female in no acute distress. HEENT: Normocephalic and atraumatic. Conjunctivae are normal. No scleral icterus. Neck supple.  Cardiovascular: Normal rate, regular rhythm.  Pulmonary/chest: Effort normal and breath sounds normal. . Abdominal: Soft, nondistended, nontender.  There are no masses palpable. No hepatomegaly. Extremities: no edema Lymphadenopathy: No cervical adenopathy noted. Neurological: Alert and oriented to person place and time. Skin: Skin is warm and dry. No rashes noted. Psychiatric: Normal mood and affect. Behavior is normal.   ASSESSMENT AND PLAN: 40 year old female history of end-stage renal disease on HD, here for assessment for dramatic weight loss in the setting of chronic nausea / vomiting, and change in bowel habits.  Workup reviewed. Noncontrast CT 2 unremarkable for any etiology. She has a chronic anemia thought to be due to her kidney disease. I think her weight loss is probably related the significant reduction in her caloric intake due to nausea / vomiting. Unclear etiology for this at present time. I did discuss with her that her chronic narcotic use is likely making this worse as one of the main side effects of narcotics is nausea. I would recommend that she is nonnarcotic based modalities for pain control if possible given her symptoms. Otherwise I offered her an upper endoscopy to initially evaluate her refractory nausea. In the  interim recommend starting her on omeprazole 40 mg once daily and standing Zofran 4 mg to 8 mg every 8 hours in hopes of improving her oral intake. Regarding her changes in bowel habits, she's had a negative tissue transglutaminase, although total IgA level was not done. We will evaluate for celiac disease at time of endoscopy. Given her loose stools and change in bowel habits I also offered her colonoscopy to ensure no IBD/inflammatory process. I discussed the risks and benefits of endoscopy with her and she wished to proceed. In the interim she can use Imodium once daily in the morning to see if this helps her stools. If she becomes constipated on this regimen she can try daily fiber supplement. Her procedures are scheduled for next week, she can call me the interim with any questions. Further recommendations pending endoscopies and her course.  Sundance Cellar, MD [facility] Pager [phone]  CC: Enid Skeens., MD

## 2016-06-03 ENCOUNTER — Telehealth: Payer: Self-pay | Admitting: Gastroenterology

## 2016-06-03 ENCOUNTER — Ambulatory Visit: Payer: Medicare Other | Admitting: Cardiology

## 2016-06-06 NOTE — Telephone Encounter (Signed)
Left message for pt informing that a rebate card was faxed to her pharmacy. Prep should be no more than $50. Instructed pt to call back if has concerns.

## 2016-06-06 NOTE — Telephone Encounter (Signed)
Pt calling back about suprep being too expensive and would like something else called in. Colon on 2.1.18

## 2016-06-09 ENCOUNTER — Ambulatory Visit (AMBULATORY_SURGERY_CENTER): Payer: Medicare Other | Admitting: Gastroenterology

## 2016-06-09 ENCOUNTER — Encounter: Payer: Self-pay | Admitting: Gastroenterology

## 2016-06-09 VITALS — BP 183/100 | HR 86 | Temp 97.7°F | Resp 11 | Ht 67.0 in | Wt 113.0 lb

## 2016-06-09 DIAGNOSIS — R194 Change in bowel habit: Secondary | ICD-10-CM

## 2016-06-09 DIAGNOSIS — D122 Benign neoplasm of ascending colon: Secondary | ICD-10-CM

## 2016-06-09 DIAGNOSIS — K2970 Gastritis, unspecified, without bleeding: Secondary | ICD-10-CM | POA: Diagnosis not present

## 2016-06-09 DIAGNOSIS — R634 Abnormal weight loss: Secondary | ICD-10-CM

## 2016-06-09 DIAGNOSIS — K3189 Other diseases of stomach and duodenum: Secondary | ICD-10-CM | POA: Diagnosis not present

## 2016-06-09 DIAGNOSIS — R110 Nausea: Secondary | ICD-10-CM | POA: Diagnosis not present

## 2016-06-09 DIAGNOSIS — K635 Polyp of colon: Secondary | ICD-10-CM | POA: Diagnosis not present

## 2016-06-09 DIAGNOSIS — K317 Polyp of stomach and duodenum: Secondary | ICD-10-CM

## 2016-06-09 DIAGNOSIS — D127 Benign neoplasm of rectosigmoid junction: Secondary | ICD-10-CM

## 2016-06-09 MED ORDER — SODIUM CHLORIDE 0.9 % IV SOLN: 500.0000 mL | INTRAVENOUS | Status: DC

## 2016-06-09 NOTE — Patient Instructions (Signed)
YOU HAD AN ENDOSCOPIC PROCEDURE TODAY AT [facility]:   Refer to the procedure report that was given to you for any specific questions about what was found during the examination.  If the procedure report does not answer your questions, please call your gastroenterologist to clarify.  If you requested that your care partner not be given the details of your procedure findings, then the procedure report has been included in a sealed envelope for you to review at your convenience later.  YOU SHOULD EXPECT: Some feelings of bloating in the abdomen. Passage of more gas than usual.  Walking can help get rid of the air that was put into your GI tract during the procedure and reduce the bloating. If you had a lower endoscopy (such as a colonoscopy or flexible sigmoidoscopy) you may notice spotting of blood in your stool or on the toilet paper. If you underwent a bowel prep for your procedure, you may not have a normal bowel movement for a few days.  Please Note:  You might notice some irritation and congestion in your nose or some drainage.  This is from the oxygen used during your procedure.  There is no need for concern and it should clear up in a day or so.  SYMPTOMS TO REPORT IMMEDIATELY:   Following lower endoscopy (colonoscopy or flexible sigmoidoscopy):  Excessive amounts of blood in the stool  Significant tenderness or worsening of abdominal pains  Swelling of the abdomen that is new, acute  Fever of 100F or higher   Following upper endoscopy (EGD)  Vomiting of blood or coffee ground material  New chest pain or pain under the shoulder blades  Painful or persistently difficult swallowing  New shortness of breath  Fever of 100F or higher  Black, tarry-looking stools  For urgent or emergent issues, a gastroenterologist can be reached at any hour by calling [phone].   DIET:  We do recommend a small meal at first, but then you may proceed to your regular diet.  Drink  plenty of fluids but you should avoid alcoholic beverages for 24 hours.  ACTIVITY:  You should plan to take it easy for the rest of today and you should NOT DRIVE or use heavy machinery until tomorrow (because of the sedation medicines used during the test).    FOLLOW UP: Our staff will call the number listed on your records the next business day following your procedure to check on you and address any questions or concerns that you may have regarding the information given to you following your procedure. If we do not reach you, we will leave a message.  However, if you are feeling well and you are not experiencing any problems, there is no need to return our call.  We will assume that you have returned to your regular daily activities without incident.  If any biopsies were taken you will be contacted by phone or by letter within the next 1-3 weeks.  Please call us at [phone] if you have not heard about the biopsies in 3 weeks.    SIGNATURES/CONFIDENTIALITY: You and/or your care partner have signed paperwork which will be entered into your electronic medical record.  These signatures attest to the fact that that the information above on your After Visit Summary has been reviewed and is understood.  Full responsibility of the confidentiality of this discharge information lies with you and/or your care-partner.   NO ASPIRIN ,NAPROXEN,IBUPROFEN OR OTHER NON STEROIDAL ANT INFLAMMATORY MEDICATIONS  FOR 2 WEEKS   Await biopsy/ pathology results  Information on gastritis given to you today  Information on polyps given to you today

## 2016-06-09 NOTE — Progress Notes (Signed)
Called to room to assist during endoscopic procedure.  Patient ID and intended procedure confirmed with present staff. Received instructions for my participation in the procedure from the performing physician.  

## 2016-06-09 NOTE — Op Note (Signed)
[facility] Patient Name: Valerie Yates Procedure Date: 06/09/2016 4:04 PM MRN: [id number] Endoscopist: Remo Lipps P. Jnai Snellgrove MD, MD Age: 41 Referring MD:  Date of Birth: [birth year] Gender: Female Account #: [id number] Procedure:                Upper GI endoscopy Indications:              Nausea with vomiting, weight loss Medicines:                Monitored Anesthesia Care Procedure:                Pre-Anesthesia Assessment:                           - Prior to the procedure, a History and Physical                            was performed, and patient medications and                            allergies were reviewed. The patient's tolerance of                            previous anesthesia was also reviewed. The risks                            and benefits of the procedure and the sedation                            options and risks were discussed with the patient.                            All questions were answered, and informed consent                            was obtained. Prior Anticoagulants: The patient has                            taken no previous anticoagulant or antiplatelet                            agents. ASA Grade Assessment: III - A patient with                            severe systemic disease. After reviewing the risks                            and benefits, the patient was deemed in                            satisfactory condition to undergo the procedure.                           After obtaining informed consent, the endoscope was  passed under direct vision. Throughout the                            procedure, the patient's blood pressure, pulse, and                            oxygen saturations were monitored continuously. The                            Model GIF-HQ190 [id number]) scope was introduced                            through the mouth, and advanced to the second part                            of duodenum.  The upper GI endoscopy was                            accomplished without difficulty. The patient                            tolerated the procedure well. Scope In: Scope Out: Findings:                 Esophagogastric landmarks were identified: the                            Z-line was found at 38 cm, the gastroesophageal                            junction was found at 38 cm and the upper extent of                            the gastric folds was found at 38 cm from the                            incisors.                           The exam of the esophagus was otherwise normal.                           Diffuse mild inflammation characterized by erythema                            and granularity was found in the gastric body and                            in the gastric antrum. Biopsies were taken with a                            cold forceps for Helicobacter pylori testing.  A single 4 mm sessile polyp was found in the                            gastric body. The polyp was removed with a cold                            biopsy forceps. Resection and retrieval were                            complete.                           The exam of the stomach was otherwise normal.                           The duodenal bulb and second portion of the                            duodenum were normal. Biopsies for histology were                            taken with a cold forceps for evaluation of celiac                            disease. Complications:            No immediate complications. Estimated blood loss:                            Minimal. Estimated Blood Loss:     Estimated blood loss was minimal. Impression:               - Esophagogastric landmarks identified.                           - Normal esophagus                           - Gastritis. Biopsied.                           - A single gastric polyp. Resected and retrieved.                           -  Normal duodenal bulb and second portion of the                            duodenum. Biopsied. Recommendation:           - Patient has a contact number available for                            emergencies. The signs and symptoms of potential                            delayed complications were discussed with the  patient. Return to normal activities tomorrow.                            Written discharge instructions were provided to the                            patient.                           - Resume previous diet.                           - Continue present medications.                           - Await pathology results. Remo Lipps P. Jamaurie Bernier MD, MD 06/09/2016 4:49:15 PM This report has been signed electronically.

## 2016-06-09 NOTE — Progress Notes (Signed)
Report to PACU, RN, vss, BBS= Clear.  

## 2016-06-09 NOTE — Op Note (Signed)
[place] Patient Name: Valerie Yates Procedure Date: 06/09/2016 4:03 PM MRN: [id number] Endoscopist: Remo Lipps P. Enolia Koepke MD, MD Age: 41 Referring MD:  Date of Birth: 1975-05-18 Gender: Female Account #: [id number] Procedure:                Colonoscopy Indications:              Change in bowel habits (loose stools), Weight loss Medicines:                Monitored Anesthesia Care Procedure:                Pre-Anesthesia Assessment:                           - Prior to the procedure, a History and Physical                            was performed, and patient medications and                            allergies were reviewed. The patient's tolerance of                            previous anesthesia was also reviewed. The risks                            and benefits of the procedure and the sedation                            options and risks were discussed with the patient.                            All questions were answered, and informed consent                            was obtained. Prior Anticoagulants: The patient has                            taken no previous anticoagulant or antiplatelet                            agents. ASA Grade Assessment: III - A patient with                            severe systemic disease. After reviewing the risks                            and benefits, the patient was deemed in                            satisfactory condition to undergo the procedure.                           After obtaining informed consent, the colonoscope  was passed under direct vision. Throughout the                            procedure, the patient's blood pressure, pulse, and                            oxygen saturations were monitored continuously. The                            Model PCF-H190L [id number]) scope was introduced                            through the anus and advanced to the the terminal                             ileum, with identification of the appendiceal                            orifice and IC valve. The colonoscopy was performed                            without difficulty. The patient tolerated the                            procedure well. The quality of the bowel                            preparation was adequate. The terminal ileum,                            ileocecal valve, appendiceal orifice, and rectum                            were photographed. Scope In: 4:23:59 PM Scope Out: 4:41:24 PM Scope Withdrawal Time: 0 hours 14 minutes 20 seconds  Total Procedure Duration: 0 hours 17 minutes 25 seconds  Findings:                 The perianal exam findings include non-thrombosed                            external hemorrhoids.                           A 5 mm polyp was found in the ascending colon. The                            polyp was flat. The polyp was removed with a cold                            snare. Resection was complete, but the polyp tissue                            was not retrieved in the trap (suctioned through  scope but not found).                           A 5 mm polyp was found in the recto-sigmoid colon.                            The polyp was sessile. The polyp was removed with a                            cold snare. Resection and retrieval were complete.                           The terminal ileum appeared normal.                           Internal hemorrhoids were found during retroflexion.                           The exam was otherwise without abnormality.                           Biopsies for histology were taken with a cold                            forceps from the right colon and left colon for                            evaluation of microscopic colitis. Complications:            No immediate complications. Estimated blood loss:                            Minimal. Estimated Blood Loss:     Estimated blood loss was  minimal. Impression:               - Non-thrombosed external hemorrhoids found on                            perianal exam.                           - One 5 mm polyp in the ascending colon, removed                            with a cold snare. Complete resection. Polyp tissue                            not retrieved.                           - One 5 mm polyp at the recto-sigmoid colon,                            removed with a cold snare. Resected and retrieved.                           -  The examined portion of the ileum was normal.                           - Internal hemorrhoids.                           - The examination was otherwise normal.                           - Biopsies were taken with a cold forceps from the                            right colon and left colon for evaluation of                            microscopic colitis. Recommendation:           - Patient has a contact number available for                            emergencies. The signs and symptoms of potential                            delayed complications were discussed with the                            patient. Return to normal activities tomorrow.                            Written discharge instructions were provided to the                            patient.                           - Resume previous diet.                           - Continue present medications.                           - No ibuprofen, naproxen, or other non-steroidal                            anti-inflammatory drugs for 2 weeks after polyp                            removal.                           - Await pathology results.                           - Repeat colonoscopy is recommended for                            surveillance. The colonoscopy date will be  determined after pathology results from today's                            exam become available for review. Remo Lipps P. Laticia Vannostrand MD,  MD 06/09/2016 4:45:49 PM This report has been signed electronically.

## 2016-06-10 ENCOUNTER — Telehealth: Payer: Self-pay

## 2016-06-10 NOTE — Telephone Encounter (Signed)
  Follow up Call-  Call back number 06/09/2016  Post procedure Call Back phone  # [phone] - cell phone- patient will be i dialysis  Permission to leave phone message Yes  Some recent data might be hidden    Patient was called for follow up after her procedure on 06/09/2016. No answer at the number given for follow up phone call. A message was left on the answering machine.

## 2016-06-10 NOTE — Telephone Encounter (Signed)
  Follow up Call-  Call back number 06/09/2016  Post procedure Call Back phone  # [phone] - cell phone- patient will be i dialysis  Permission to leave phone message Yes  Some recent data might be hidden     Patient questions:  Do you have a fever, pain , or abdominal swelling? No. Pain Score  0 *  Have you tolerated food without any problems? Yes.    Have you been able to return to your normal activities? Yes.    Do you have any questions about your discharge instructions: Diet   No. Medications  No. Follow up visit  No.  Do you have questions or concerns about your Care? No.  Actions: * If pain score is 4 or above: No action needed, pain <4.

## 2016-06-23 ENCOUNTER — Other Ambulatory Visit: Payer: Self-pay

## 2016-06-23 ENCOUNTER — Telehealth: Payer: Self-pay | Admitting: Gastroenterology

## 2016-06-23 MED ORDER — METOCLOPRAMIDE HCL 5 MG PO TABS: 5.0000 mg | ORAL_TABLET | Freq: Three times a day (TID) | ORAL | 1 refills | Status: DC

## 2016-06-24 NOTE — Telephone Encounter (Signed)
Left message on cell informing that she needs to continue taking all three medications and to call if she has concerns.

## 2016-06-24 NOTE — Telephone Encounter (Signed)
Valerie Yates can you please tell this patient yes, she should continue everything in addition to the Reglan. Thanks

## 2016-07-07 ENCOUNTER — Ambulatory Visit (INDEPENDENT_AMBULATORY_CARE_PROVIDER_SITE_OTHER): Payer: Medicare Other | Admitting: Cardiology

## 2016-07-07 VITALS — BP 156/88 | HR 86 | Ht 67.0 in | Wt 114.4 lb

## 2016-07-07 DIAGNOSIS — I10 Essential (primary) hypertension: Secondary | ICD-10-CM

## 2016-07-07 DIAGNOSIS — I340 Nonrheumatic mitral (valve) insufficiency: Secondary | ICD-10-CM

## 2016-07-07 DIAGNOSIS — I420 Dilated cardiomyopathy: Secondary | ICD-10-CM | POA: Diagnosis not present

## 2016-07-07 NOTE — Patient Instructions (Addendum)
SCHEDULE [place] April OR MAY 2018 NON - DIALYSIS DAY  Tuesday or Thursday Your physician has requested that you have an echocardiogram. Echocardiography is a painless test that uses sound waves to create images of your heart. It provides your doctor with information about the size and shape of your heart and how well your heart's chambers and valves are working. This procedure takes approximately one hour. There are no restrictions for this procedure. ( CHECK FOR PUMPING FUNCTIONS and valve disease)  NO CHANGES WITH CURRENT MEDICATION  Your physician recommends that you schedule a follow-up appointment in June /July 2018 with DR [facility].   If you need a refill on your cardiac medications before your next appointment, please call your pharmacy.
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
## 2016-07-09 ENCOUNTER — Encounter: Payer: Self-pay | Admitting: Cardiology

## 2016-07-09 NOTE — Assessment & Plan Note (Signed)
Probably hypertensive related, as well as volume overload from renal insufficiency. I suspect again that the reduced EF may have been volume overload related. We're rechecking a 2-D echocardiogram to reassess. Next on however if the EF is still down, I think we may need to pursue an ischemic evaluation based on her recent history of chest pain.

## 2016-07-09 NOTE — Assessment & Plan Note (Addendum)
I suspect that the MR and TR noted on her echocardiogram was partly functional related to volume overload, however cannot be certain. I don't hear any significant murmurs on exam which makes me suspect that the MR is not that significant. However in light of her ongoing evaluation for potential kidney transplant, I think we need to ensure that the valvular disease was indeed functional. Plan: 2-D echocardiogram. - Will also need to ensure that she has adequate blood pressure control. My concern is that when she is off of her dialysis today or for instance like today when she did not have full dialysis yesterday, her blood pressure is higher and therefore may very well have more significant MR

## 2016-07-09 NOTE — Assessment & Plan Note (Signed)
Blood pressure still high today. Again I'm deferring to her nephrologist to titrate her medications. She is on good dose of lisinopril. She also indicates her palpitations got better with going back down on her carvedilol dose. I suspect that we are probably going to need to use calcium channel blocker as well.

## 2016-08-06 ENCOUNTER — Encounter (HOSPITAL_COMMUNITY): Payer: Self-pay | Admitting: *Deleted

## 2016-08-06 NOTE — Progress Notes (Signed)
Patient reports that chest pain has been relieved since excess fluid was removed. Reports that she had an echo and stress at [facility] in January will request records on Monday. Reports that echo is not scheduled until June of this year. Patient verbalized understanding of all preop instructions. Notified patient that she may receive call from Anesthesia PA/ NP d/t chest pain history. Left message with Anesthesia to review chart.

## 2016-08-08 ENCOUNTER — Other Ambulatory Visit: Payer: Self-pay | Admitting: Orthopedic Surgery

## 2016-08-09 ENCOUNTER — Encounter (HOSPITAL_COMMUNITY): Payer: Self-pay | Admitting: Emergency Medicine

## 2016-08-09 NOTE — Progress Notes (Signed)
Anesthesia Chart Review:  Pt is a same day work up.   Pt is a 41 year old female scheduled for R shoulder arthroscopy with labral repair vs capsulolabral reconstruction, possible bursectomy on 08/11/2016 with Justice Britain, MD  - Cardiologist is Glenetta Hew, MD, last office visit 07/07/16.  His note indicates pt will need repeat echo as EF was slightly decreased in the setting of fluid overload last summer. Echo scheduled for after surgery.  PMH includes:  Cardiomyopathy, HTN, ESRD on hemodialysis, iron deficiency anemia, secondary hyperparathyroidism, heart murmur, GERD. Never smoker. BMI 18  Medications include: Carvedilol, lisinopril, Prilosec  Labs will be obtained DOS.   CXR 05/15/16: Pulmonary vascular congestion without frank edema or consolidation.  EKG 05/15/16: NSR. Moderate voltage criteria for LVH, may be normal variant. Nonspecific ST and T wave abnormality. Prolonged QT  Echo 10/27/15 (done in the setting of volume overload):  1. Overall LV systolic function is mildly impaired, EF 45-50% 2. Pseudonormal LV filling pattern, consistent with elevated LA pressure.  3. LA moderately dilated 4. Moderate mitral regurgitation 5. Moderate tricuspid regurgitation 6. RV systolic pressure is 52 mmHg  Nuclear stress test 08/29/14 (care everywhere):  Physiological distribution of the radiotracer .No unexpected findings on raw images .No transient ischemic dilatation. .No fixed or transient perfusion defects at stress or rest. .On SPECT .Ejection fraction : Greater than 70 % .Wall motion abnormalities: None  .Background activity: Normal  Reviewed case with Dr. Linna Caprice. If labs acceptable DOS, I anticipate pt can proceed with surgery as scheduled.   Willeen Cass, FNP-BC [facility]/Anesthesiology Phone: [phone] 08/09/2016 4:20 PM

## 2016-08-10 MED ORDER — CLINDAMYCIN PHOSPHATE 900 MG/50ML IV SOLN
900.0000 mg | INTRAVENOUS | Status: AC
  Administered 2016-08-11: 900 mg via INTRAVENOUS
  Filled 2016-08-10: qty 50

## 2016-08-11 ENCOUNTER — Ambulatory Visit (HOSPITAL_COMMUNITY): Payer: Medicare Other | Admitting: Emergency Medicine

## 2016-08-11 ENCOUNTER — Encounter (HOSPITAL_COMMUNITY): Payer: Self-pay | Admitting: *Deleted

## 2016-08-11 ENCOUNTER — Encounter (HOSPITAL_COMMUNITY)
Admission: RE | Disposition: A | Discharge status: Home / Self Care | Payer: Self-pay | Source: Ambulatory Visit | Attending: Orthopedic Surgery

## 2016-08-11 ENCOUNTER — Ambulatory Visit (HOSPITAL_COMMUNITY)
Admission: RE | Admit: 2016-08-11 | Discharge: 2016-08-11 | Disposition: A | Discharge status: Home / Self Care | Payer: Medicare Other | Source: Ambulatory Visit | Attending: Orthopedic Surgery | Admitting: Orthopedic Surgery

## 2016-08-11 DIAGNOSIS — Z79899 Other long term (current) drug therapy: Secondary | ICD-10-CM | POA: Diagnosis not present

## 2016-08-11 DIAGNOSIS — G43909 Migraine, unspecified, not intractable, without status migrainosus: Secondary | ICD-10-CM | POA: Insufficient documentation

## 2016-08-11 DIAGNOSIS — Z833 Family history of diabetes mellitus: Secondary | ICD-10-CM | POA: Diagnosis not present

## 2016-08-11 DIAGNOSIS — Z803 Family history of malignant neoplasm of breast: Secondary | ICD-10-CM | POA: Diagnosis not present

## 2016-08-11 DIAGNOSIS — N186 End stage renal disease: Secondary | ICD-10-CM | POA: Diagnosis not present

## 2016-08-11 DIAGNOSIS — Z8249 Family history of ischemic heart disease and other diseases of the circulatory system: Secondary | ICD-10-CM | POA: Diagnosis not present

## 2016-08-11 DIAGNOSIS — K219 Gastro-esophageal reflux disease without esophagitis: Secondary | ICD-10-CM | POA: Diagnosis not present

## 2016-08-11 DIAGNOSIS — M25311 Other instability, right shoulder: Secondary | ICD-10-CM | POA: Diagnosis present

## 2016-08-11 DIAGNOSIS — I120 Hypertensive chronic kidney disease with stage 5 chronic kidney disease or end stage renal disease: Secondary | ICD-10-CM | POA: Diagnosis not present

## 2016-08-11 DIAGNOSIS — Z992 Dependence on renal dialysis: Secondary | ICD-10-CM | POA: Insufficient documentation

## 2016-08-11 DIAGNOSIS — M25511 Pain in right shoulder: Secondary | ICD-10-CM | POA: Insufficient documentation

## 2016-08-11 DIAGNOSIS — Z87440 Personal history of urinary (tract) infections: Secondary | ICD-10-CM | POA: Insufficient documentation

## 2016-08-11 DIAGNOSIS — N2581 Secondary hyperparathyroidism of renal origin: Secondary | ICD-10-CM | POA: Insufficient documentation

## 2016-08-11 DIAGNOSIS — D509 Iron deficiency anemia, unspecified: Secondary | ICD-10-CM | POA: Diagnosis not present

## 2016-08-11 DIAGNOSIS — R0602 Shortness of breath: Secondary | ICD-10-CM | POA: Diagnosis not present

## 2016-08-11 DIAGNOSIS — F419 Anxiety disorder, unspecified: Secondary | ICD-10-CM | POA: Insufficient documentation

## 2016-08-11 HISTORY — PX: SHOULDER ARTHROSCOPY WITH LABRAL REPAIR: SHX5691

## 2016-08-11 HISTORY — DX: Personal history of urinary (tract) infections: Z87.440

## 2016-08-11 LAB — SURGICAL PCR SCREEN
MRSA, PCR: NEGATIVE
STAPHYLOCOCCUS AUREUS: NEGATIVE

## 2016-08-11 LAB — POCT I-STAT 4, (NA,K, GLUC, HGB,HCT)
Glucose, Bld: 86 mg/dL (ref 65–99)
HCT: 30 % — ABNORMAL LOW (ref 36.0–46.0)
Hemoglobin: 10.2 g/dL — ABNORMAL LOW (ref 12.0–15.0)
POTASSIUM: 4.6 mmol/L (ref 3.5–5.1)
SODIUM: 139 mmol/L (ref 135–145)

## 2016-08-11 SURGERY — ARTHROSCOPY, SHOULDER, WITH GLENOID LABRUM REPAIR
Anesthesia: General | Site: Shoulder | Laterality: Right

## 2016-08-11 MED ORDER — PHENYLEPHRINE 40 MCG/ML (10ML) SYRINGE FOR IV PUSH (FOR BLOOD PRESSURE SUPPORT)
PREFILLED_SYRINGE | INTRAVENOUS | Status: AC
  Filled 2016-08-11: qty 10

## 2016-08-11 MED ORDER — PROPOFOL 10 MG/ML IV BOLUS
INTRAVENOUS | Status: AC
  Filled 2016-08-11: qty 20

## 2016-08-11 MED ORDER — OXYCODONE HCL 5 MG/5ML PO SOLN: 5.0000 mg | Freq: Once | ORAL | Status: DC | PRN

## 2016-08-11 MED ORDER — LIDOCAINE HCL (CARDIAC) 20 MG/ML IV SOLN
INTRAVENOUS | Status: DC | PRN
  Administered 2016-08-11: 60 mg via INTRAVENOUS

## 2016-08-11 MED ORDER — CHLORHEXIDINE GLUCONATE 4 % EX LIQD: 60.0000 mL | Freq: Once | CUTANEOUS | Status: DC

## 2016-08-11 MED ORDER — 0.9 % SODIUM CHLORIDE (POUR BTL) OPTIME
TOPICAL | Status: DC | PRN
  Administered 2016-08-11: 1000 mL

## 2016-08-11 MED ORDER — MIDAZOLAM HCL 2 MG/2ML IJ SOLN
INTRAMUSCULAR | Status: AC
  Administered 2016-08-11: 2 mg via INTRAVENOUS
  Filled 2016-08-11: qty 2

## 2016-08-11 MED ORDER — PHENYLEPHRINE HCL 10 MG/ML IJ SOLN
INTRAMUSCULAR | Status: DC | PRN
  Administered 2016-08-11: 80 ug via INTRAVENOUS

## 2016-08-11 MED ORDER — SODIUM CHLORIDE 0.9 % IV SOLN
INTRAVENOUS | Status: DC
  Administered 2016-08-11 (×2): via INTRAVENOUS

## 2016-08-11 MED ORDER — OXYCODONE HCL 5 MG PO TABS: 5.0000 mg | ORAL_TABLET | Freq: Once | ORAL | Status: DC | PRN

## 2016-08-11 MED ORDER — ONDANSETRON HCL 4 MG/2ML IJ SOLN
INTRAMUSCULAR | Status: AC
  Filled 2016-08-11: qty 2

## 2016-08-11 MED ORDER — SUGAMMADEX SODIUM 200 MG/2ML IV SOLN
INTRAVENOUS | Status: DC | PRN
  Administered 2016-08-11: 100 mg via INTRAVENOUS

## 2016-08-11 MED ORDER — MIDAZOLAM HCL 2 MG/2ML IJ SOLN
2.0000 mg | Freq: Once | INTRAMUSCULAR | Status: AC
  Administered 2016-08-11: 2 mg via INTRAVENOUS

## 2016-08-11 MED ORDER — ONDANSETRON HCL 4 MG/2ML IJ SOLN
4.0000 mg | Freq: Four times a day (QID) | INTRAMUSCULAR | Status: AC | PRN
  Administered 2016-08-11: 4 mg via INTRAVENOUS

## 2016-08-11 MED ORDER — FENTANYL CITRATE (PF) 100 MCG/2ML IJ SOLN
INTRAMUSCULAR | Status: DC | PRN
  Administered 2016-08-11 (×2): 50 ug via INTRAVENOUS

## 2016-08-11 MED ORDER — FENTANYL CITRATE (PF) 250 MCG/5ML IJ SOLN
INTRAMUSCULAR | Status: AC
  Filled 2016-08-11: qty 5

## 2016-08-11 MED ORDER — PROPOFOL 10 MG/ML IV BOLUS
INTRAVENOUS | Status: DC | PRN
  Administered 2016-08-11: 120 mg via INTRAVENOUS

## 2016-08-11 MED ORDER — DEXAMETHASONE SODIUM PHOSPHATE 10 MG/ML IJ SOLN
INTRAMUSCULAR | Status: AC
  Filled 2016-08-11: qty 1

## 2016-08-11 MED ORDER — FERROUS SULFATE 220 (44 FE) MG/5ML PO ELIX: 220.0000 mg | ORAL_SOLUTION | Freq: Every day | ORAL | Status: DC

## 2016-08-11 MED ORDER — SODIUM CHLORIDE 0.9 % IR SOLN
Status: DC | PRN
  Administered 2016-08-11 (×3): 3000 mL

## 2016-08-11 MED ORDER — SUGAMMADEX SODIUM 200 MG/2ML IV SOLN
INTRAVENOUS | Status: AC
  Filled 2016-08-11: qty 2

## 2016-08-11 MED ORDER — BUPIVACAINE-EPINEPHRINE (PF) 0.5% -1:200000 IJ SOLN
INTRAMUSCULAR | Status: DC | PRN
  Administered 2016-08-11: 30 mL via PERINEURAL

## 2016-08-11 MED ORDER — FENTANYL CITRATE (PF) 100 MCG/2ML IJ SOLN
INTRAMUSCULAR | Status: AC
  Administered 2016-08-11: 100 ug via INTRAVENOUS
  Filled 2016-08-11: qty 2

## 2016-08-11 MED ORDER — CYCLOBENZAPRINE HCL 10 MG PO TABS: 10.0000 mg | ORAL_TABLET | Freq: Three times a day (TID) | ORAL | 1 refills | Status: DC | PRN

## 2016-08-11 MED ORDER — OXYCODONE-ACETAMINOPHEN 5-325 MG PO TABS: 1.0000 | ORAL_TABLET | ORAL | 0 refills | Status: AC | PRN

## 2016-08-11 MED ORDER — ROCURONIUM BROMIDE 100 MG/10ML IV SOLN
INTRAVENOUS | Status: DC | PRN
  Administered 2016-08-11: 50 mg via INTRAVENOUS

## 2016-08-11 MED ORDER — ONDANSETRON HCL 4 MG PO TABS: 4.0000 mg | ORAL_TABLET | Freq: Three times a day (TID) | ORAL | 0 refills | Status: DC | PRN

## 2016-08-11 MED ORDER — DEXAMETHASONE SODIUM PHOSPHATE 10 MG/ML IJ SOLN
INTRAMUSCULAR | Status: DC | PRN
  Administered 2016-08-11: 4 mg via INTRAVENOUS

## 2016-08-11 MED ORDER — ONDANSETRON HCL 4 MG/2ML IJ SOLN
INTRAMUSCULAR | Status: DC | PRN
  Administered 2016-08-11: 4 mg via INTRAVENOUS

## 2016-08-11 MED ORDER — MIDAZOLAM HCL 2 MG/2ML IJ SOLN
INTRAMUSCULAR | Status: AC
  Filled 2016-08-11: qty 2

## 2016-08-11 MED ORDER — FENTANYL CITRATE (PF) 100 MCG/2ML IJ SOLN
100.0000 ug | Freq: Once | INTRAMUSCULAR | Status: AC
  Administered 2016-08-11: 100 ug via INTRAVENOUS

## 2016-08-11 MED ORDER — FENTANYL CITRATE (PF) 100 MCG/2ML IJ SOLN: 25.0000 ug | INTRAMUSCULAR | Status: DC | PRN

## 2016-08-11 SURGICAL SUPPLY — 57 items
ANCHOR JUGGERKNOT SZ1 (Anchor) ×18 IMPLANT
BLADE CUTTER GATOR 3.5 (BLADE) ×3 IMPLANT
BLADE SURG 11 STRL SS (BLADE) ×3 IMPLANT
BOOTCOVER CLEANROOM LRG (PROTECTIVE WEAR) ×6 IMPLANT
CANISTER SUCT LVC 12 LTR MEDI- (MISCELLANEOUS) ×3 IMPLANT
CANNULA DRILOCK 5.0MMX75MM (CANNULA) ×3
CANNULA DRILOCK 5.0X75 (CANNULA) ×6 IMPLANT
CLOSURE WOUND 1/2 X4 (GAUZE/BANDAGES/DRESSINGS) ×1
CONNECTOR 5 IN 1 STRAIGHT STRL (MISCELLANEOUS) ×3 IMPLANT
DRAPE HALF SHEET 40X57 (DRAPES) ×3 IMPLANT
DRAPE INCISE 23X17 IOBAN STRL (DRAPES)
DRAPE INCISE IOBAN 23X17 STRL (DRAPES) IMPLANT
DRAPE STERI 35X30 U-POUCH (DRAPES) IMPLANT
DRAPE SURG 17X11 SM STRL (DRAPES) ×3 IMPLANT
DRAPE U-SHAPE 47X51 STRL (DRAPES) IMPLANT
DRSG PAD ABDOMINAL 8X10 ST (GAUZE/BANDAGES/DRESSINGS) ×6 IMPLANT
DURAPREP 26ML APPLICATOR (WOUND CARE) ×3 IMPLANT
FLUID NSS /IRRIG 3000 ML XXX (IV SOLUTION) ×3 IMPLANT
GAUZE SPONGE 4X4 12PLY STRL (GAUZE/BANDAGES/DRESSINGS) ×3 IMPLANT
GAUZE SPONGE 4X4 16PLY XRAY LF (GAUZE/BANDAGES/DRESSINGS) ×3 IMPLANT
GLOVE BIO SURGEON STRL SZ7.5 (GLOVE) ×3 IMPLANT
GLOVE BIO SURGEON STRL SZ8 (GLOVE) ×3 IMPLANT
GLOVE EUDERMIC 7 POWDERFREE (GLOVE) ×3 IMPLANT
GLOVE SS BIOGEL STRL SZ 7.5 (GLOVE) ×1 IMPLANT
GLOVE SUPERSENSE BIOGEL SZ 7.5 (GLOVE) ×2
GOWN STRL REUS W/ TWL LRG LVL3 (GOWN DISPOSABLE) ×1 IMPLANT
GOWN STRL REUS W/ TWL XL LVL3 (GOWN DISPOSABLE) ×2 IMPLANT
GOWN STRL REUS W/TWL LRG LVL3 (GOWN DISPOSABLE) ×2
GOWN STRL REUS W/TWL XL LVL3 (GOWN DISPOSABLE) ×4
KIT BASIN OR (CUSTOM PROCEDURE TRAY) ×3 IMPLANT
KIT ROOM TURNOVER OR (KITS) ×3 IMPLANT
KIT SHOULDER TRACTION (DRAPES) ×3 IMPLANT
LASSO 45 DEG CVD LEFT (SUTURE) ×3 IMPLANT
MANIFOLD NEPTUNE II (INSTRUMENTS) ×3 IMPLANT
NDL SUT 6 .5 CRC .975X.05 MAYO (NEEDLE) IMPLANT
NEEDLE MAYO TAPER (NEEDLE)
NEEDLE SPNL 18GX3.5 QUINCKE PK (NEEDLE) ×3 IMPLANT
NS IRRIG 1000ML POUR BTL (IV SOLUTION) ×3 IMPLANT
PACK SHOULDER (CUSTOM PROCEDURE TRAY) ×3 IMPLANT
PAD ARMBOARD 7.5X6 YLW CONV (MISCELLANEOUS) ×9 IMPLANT
SET ARTHROSCOPY TUBING (MISCELLANEOUS) ×2
SET ARTHROSCOPY TUBING LN (MISCELLANEOUS) ×1 IMPLANT
SET JUGGERKNOT DISP 1.4MM (SET/KITS/TRAYS/PACK) ×3 IMPLANT
SLING ARM IMMOBILIZER LRG (SOFTGOODS) IMPLANT
SLING ARM IMMOBILIZER MED (SOFTGOODS) ×6 IMPLANT
SPONGE LAP 4X18 X RAY DECT (DISPOSABLE) IMPLANT
STRIP CLOSURE SKIN 1/2X4 (GAUZE/BANDAGES/DRESSINGS) ×2 IMPLANT
SUT LASSO 45 DEG R (SUTURE) ×3 IMPLANT
SUT MNCRL AB 3-0 PS2 18 (SUTURE) ×3 IMPLANT
SUT PDS AB 1 CT  36 (SUTURE) ×4
SUT PDS AB 1 CT 36 (SUTURE) ×2 IMPLANT
SUT RETRIEVER GRASP 30 DEG (SUTURE) ×3 IMPLANT
SYR 20CC LL (SYRINGE) IMPLANT
TAPE PAPER 3X10 WHT MICROPORE (GAUZE/BANDAGES/DRESSINGS) ×3 IMPLANT
TOWEL OR 17X24 6PK STRL BLUE (TOWEL DISPOSABLE) ×3 IMPLANT
TOWEL OR 17X26 10 PK STRL BLUE (TOWEL DISPOSABLE) ×3 IMPLANT
WATER STERILE IRR 1000ML POUR (IV SOLUTION) ×3 IMPLANT

## 2016-08-11 NOTE — Anesthesia Preprocedure Evaluation (Signed)
Anesthesia Evaluation  Patient identified by MRN, date of birth, ID band Patient awake    Reviewed: Allergy & Precautions, H&P , NPO status , Patient's Chart, lab work & pertinent test results  Airway Mallampati: II   Neck ROM: full    Dental   Pulmonary shortness of breath,    breath sounds clear to auscultation       Cardiovascular hypertension,  Rhythm:regular Rate:Normal     Neuro/Psych  Headaches, PSYCHIATRIC DISORDERS Anxiety    GI/Hepatic GERD  ,  Endo/Other    Renal/GU ESRF and DialysisRenal disease     Musculoskeletal   Abdominal   Peds  Hematology   Anesthesia Other Findings   Reproductive/Obstetrics                             Anesthesia Physical Anesthesia Plan  ASA: III  Anesthesia Plan: General   Post-op Pain Management: GA combined w/ Regional for post-op pain   Induction: Intravenous  Airway Management Planned: Oral ETT  Additional Equipment:   Intra-op Plan:   Post-operative Plan: Extubation in OR  Informed Consent: I have reviewed the patients History and Physical, chart, labs and discussed the procedure including the risks, benefits and alternatives for the proposed anesthesia with the patient or authorized representative who has indicated his/her understanding and acceptance.     Plan Discussed with: CRNA, Anesthesiologist and Surgeon  Anesthesia Plan Comments:         Anesthesia Quick Evaluation

## 2016-08-11 NOTE — Transfer of Care (Signed)
Immediate Anesthesia Transfer of Care Note  Patient: Valerie Yates  Procedure(s) Performed: Procedure(s): RIGHT SHOULDER ARTHROSCOPY WITH LABRAL REPAIR (Right)  Patient Location: PACU  Anesthesia Type:GA combined with regional for post-op pain  Level of Consciousness: awake and alert   Airway & Oxygen Therapy: Patient Spontanous Breathing and Patient connected to face mask oxygen  Post-op Assessment: Report given to RN and Post -op Vital signs reviewed and stable  Post vital signs: Reviewed and stable  Last Vitals:  Vitals:   08/11/16 0900 08/11/16 1220  BP: (!) 191/97   Pulse: 76   Resp: 13   Temp:  36.1 C    Last Pain:  Vitals:   08/11/16 0749  TempSrc:   PainSc: 8       Patients Stated Pain Goal: 4 (04/24/23 4695)  Complications: No apparent anesthesia complications

## 2016-08-11 NOTE — Op Note (Signed)
08/11/2016  12:06 PM  PATIENT:   Valerie Yates  41 y.o. female  PRE-OPERATIVE DIAGNOSIS:  Right shoulder anterior instability  POST-OPERATIVE DIAGNOSIS:  Right shoulder multidirectional instability  PROCEDURE:  RSA, anterior and posterior capsulo-labral reconstructions  SURGEON:  Devanie Galanti, Metta Clines M.D.  ASSISTANTS: Shuford pac   ANESTHESIA:   GET + ISB  EBL: min  SPECIMEN:  none  Drains: none   PATIENT DISPOSITION:  PACU - hemodynamically stable.    PLAN OF CARE: Discharge to home after PACU  Dictation# [id number]   Contact # [phone]

## 2016-08-11 NOTE — Anesthesia Procedure Notes (Signed)
Anesthesia Regional Block: Interscalene brachial plexus block   Pre-Anesthetic Checklist: ,, timeout performed, Correct Patient, Correct Site, Correct Laterality, Correct Procedure, Correct Position, site marked, Risks and benefits discussed,  Surgical consent,  Pre-op evaluation,  At surgeon's request and post-op pain management  Laterality: Right  Prep: chloraprep       Needles:  Injection technique: Single-shot  Needle Type: Echogenic Stimulator Needle     Needle Length: 5cm  Needle Gauge: 22     Additional Needles:   Procedures: ultrasound guided, nerve stimulator,,,,,,   Nerve Stimulator or Paresthesia:  Response: biceps flexion, 0.45 mA,   Additional Responses:   Narrative:  Start time: 08/11/2016 9:27 AM End time: 08/11/2016 9:37 AM Injection made incrementally with aspirations every 5 mL.  Performed by: Personally  Anesthesiologist: Danile Trier  Additional Notes: Functioning IV was confirmed and monitors were applied.  A 56mm 22ga Arrow echogenic stimulator needle was used. Sterile prep and drape,hand hygiene and sterile gloves were used.  Negative aspiration and negative test dose prior to incremental administration of local anesthetic. The patient tolerated the procedure well.  Ultrasound guidance: relevent anatomy identified, needle position confirmed, local anesthetic spread visualized around nerve(s), vascular puncture avoided.  Image printed for medical record.

## 2016-08-11 NOTE — Anesthesia Procedure Notes (Signed)
Procedure Name: Intubation Date/Time: 08/11/2016 10:11 AM Performed by: Rejeana Brock L Pre-anesthesia Checklist: Patient identified, Emergency Drugs available, Suction available and Patient being monitored Patient Re-evaluated:Patient Re-evaluated prior to inductionOxygen Delivery Method: Circle System Utilized Preoxygenation: Pre-oxygenation with 100% oxygen Intubation Type: IV induction Ventilation: Mask ventilation without difficulty Laryngoscope Size: Mac and 3 Grade View: Grade I Tube type: Oral Tube size: 7.0 mm Number of attempts: 1 Airway Equipment and Method: Stylet and Oral airway Placement Confirmation: ETT inserted through vocal cords under direct vision,  positive ETCO2 and breath sounds checked- equal and bilateral Secured at: 21 cm Tube secured with: Tape Dental Injury: Teeth and Oropharynx as per pre-operative assessment

## 2016-08-11 NOTE — Discharge Instructions (Signed)
° °  Metta Clines. Supple, M.D., F.A.A.O.S. Orthopaedic Surgery Specializing in Arthroscopic and Reconstructive Surgery of the Shoulder and Knee [phone] [address] [place], [place] [address] - Fax [phone]  POST-OP SHOULDER ARTHROSCOPIC  LABRAL REPAIR INSTRUCTIONS  1. Call the office at [phone] to schedule your first post-op appointment 7-10 days from the date of your surgery.  2. Leave the steri-strips in place over your incisions when performing dressing changes and showering. You may remove your dressings and begin showering 72 hours from surgery. You can expect drainage that is clear to bloody in nature that occasionally will soak through your dressings. If this occurs go ahead and perform a dressing change. The drainage should lessen daily and when there is no drainage from your incisions feel free to go without a dressing.  3. Wear your sling/immobilizer at all times except to perform the exercises below or to occasionally let your arm dangle by your side to stretch your elbow. You also need to sleep in your sling immobilizer until instructed otherwise.  4. Range of motion to your elbow, wrist, and hand are encouraged 3-5 times daily. Exercise to your hand and fingers helps to reduce swelling you may experience.  5. Utilize ice to the shoulder 3-4 times minimum a day and additionally if you are experiencing pain.  6. You may one-armed drive when safely off of narcotics and muscle relaxants. You may use your hand that is in the sling to support the steering wheel only. However, should it be your right arm that is in the sling it is not to be used for gear shifting in a manual transmission.  7. If you had a block pre-operatively to provide post-op pain relief you may want to go ahead and begin utilizing your pain meds as your arm begins to wake up. Blocks can sometimes last up to 16-18 hours. If you are still pain-free prior to going to bed you may want to strongly  consider taking a pain medication to avoid being awakened in the night with the onset of pain. A muscle relaxant is also provided for you should you experience muscle spasms. It is recommended that if you are experiencing pain that your pain medication alone is not controlling, add the muscle relaxant along with the pain medication which can give additional pain relief. The first one to two days is generally the most severe of your pain and then should gradually decrease. As your pain lessens it is recommended that you decrease your use of the pain medications to an "as needed basis" only and to always comply with the recommended dosages of the pain medications.  8. Pain medications can produce constipation along with their use. If you experience this, the use of an over the counter stool softener or laxative daily is recommended.   9. For additional questions or concerns, please do not hesitate to call the office. If after hours there is an answering service to forward your concerns to the physician on call.  POST-OP EXERCISES  Pendulum Exercises  Perform pendulum exercises while standing and bending at the waist. Support your uninvolved arm on a table or chair and allow your operated arm to hang freely. Make sure to do these exercises passively - not using you shoulder muscle.  Repeat 20 times. Do 3 sessions per day.

## 2016-08-11 NOTE — Op Note (Signed)
NAME:  Valerie Yates, Valerie Yates                       ACCOUNT NO.:  MEDICAL RECORD NO.:  [id number]  LOCATION:                                 FACILITY:  PHYSICIAN:  Metta Clines. Rikki Trosper, M.D.  DATE OF BIRTH:  December 20, 1975  DATE OF PROCEDURE:  08/11/2016 DATE OF DISCHARGE:                              OPERATIVE REPORT   PREOPERATIVE DIAGNOSIS:  Right shoulder anterior with possible multidirectional instability.  POSTOPERATIVE DIAGNOSIS:  Right shoulder multidirectional instability.  PROCEDURES: 1. Right shoulder examination under anesthesia. 2. Right shoulder glenohumeral joint diagnostic arthroscopy. 3. Anterior capsulolabral reconstruction. 4. Posterior capsulolabral reconstruction.  SURGEON:  Metta Clines. Yeray Tomas, M.D.  Terrence DupontOlivia Mackie A. Shuford, P.A.-C.  ANESTHESIA:  General endotracheal as well as an interscalene block.  ESTIMATED BLOOD LOSS:  Minimal.  DRAINS:  None.  HISTORY:  Valerie Yates is a 41 year old female with a history of chronic right shoulder pain with examination suggestive of predominantly anterior but potentially multidirectional instability.  She reports having had similar difficulties with left shoulder years ago and had what sounds to have been arthroscopic stabilization and states the right shoulder is now increasingly symptomatic.  She is brought to the operating room at this time for planned right shoulder arthroscopic stabilization as described below.  Preoperatively, I counseled Valerie Yates regarding treatment options and potential risks versus benefits thereof.  Possible surgical complications were reviewed including bleeding, infection, neurovascular injury, persistent pain, loss of motion, recurrence of instability, anesthetic complication, and possible need for additional surgery.  She understands and accepts and agrees with our planned procedure.  PROCEDURE IN DETAIL:  After undergoing routine preop evaluation, the patient received prophylactic antibiotics,  and an interscalene block was established in the holding area with the Anesthesia Department.  She was placed supine on the operating table, underwent smooth induction of general endotracheal anesthesia.  Turned to the left lateral decubitus position on a beanbag and appropriately padded and protected.  Right shoulder examination under anesthesia revealed evidence for marked anterior and anterior-inferior instability and likely associated posterior-inferior instability.  Right arm was then suspended at 70 degrees of abduction.  Using the Arthrex shoulder positioner with both longitudinal and lateral traction, the shoulder girdle region was sterilely prepped and draped in standard fashion.  Time-out was called. Posterior portal was established into the glenohumeral joint and anterior portal was established under direct visualization.  Capsular volume was markedly enlarged and evidence for marked anteroinferior, inferior, and posteroinferior laxity.  Rotator cuff was intact.  Biceps anchor was stable.  Biceps tendon with normal caliber, no proximal or distal instability.  There was some apparent superficial fraying of the posterior labrum, but no obvious labral detachment, but certainly capsule volume was increased particularly inferiorly.  Given these findings, we elected to proceed with an anterior and posterior capsulolabral reconstruction.  We began anteriorly by elevating the labrum from 3 o'clock down just past the 6 o'clock position, mobilized the anterior band of the inferior glenohumeral ligament and then abraded the anterior-inferior margin of the glenoid at the osteoarticular margin with a ball rasp creating a bony bed for our repair and then removed soft tissue debris and confirmed  that we could superiorly shift the anterior band at the inferior glenohumeral ligament.  We then established an accessory anterior-inferior portal through the upper border of the subscapularis, placed  a series of 3 JuggerKnot suture anchors equidistant across the arc of the anterior-inferior quadrant of the glenoid at the osteoarticular margin.  These suture limbs were all then shuttled in horizontal mattress pattern through the anterior band of the inferior glenohumeral ligament in a horizontal mattress pattern allowing a superior-ward shift of the tissues allowing [place] to eliminate the redundant axillary pouch space and nicely nicely plaiting the anterior-inferior capsular tissues, recreating appropriate anterior- inferior capsulolabral "bumper" and all of these suture limbs were then tied using a sliding locking knots followed by multiple overhand throws and alternating posts, and the suture limbs were all then clipped allowing excellent elimination of the anterior-inferior capsular redundancy.  We then turned our attention posteriorly where in a similar fashion  we elevated the posterior-inferior margin of the glenoid.  This from 6 o'clock up to the 8 o'clock position we abraded the margin of the glenoid to a bony bed.  We then placed 2 JuggerKnot suture anchors utilizing an accessory posterior-inferior portal.  These were at the 7 o'clock and 8 o'clock position.  These again were shuttled through the posterior band of the inferior glenohumeral ligament using a suture shuttle technique and allowing a superior shift of the tissues and recreating a posterior-inferior capsulolabral "bumper" and allowing [place] to eliminate the redundant posterior-inferior capsular volume and again recreating a posterior capsulolabral bumper.  The suture limbs were all appropriately trimmed after the tissues were tied again with a standard sliding locking knot followed by multiple overhand throws and alternating posts.  At the completion, we were very pleased with the elimination of redundant inferior axillary pouch volume and recreation of taut anterior-posterior bands of the inferior glenohumeral  ligament. Fluid and instrument were then removed.  The portals were closed with Monocryl and Steri-Strips.  A dry dressing was taped about the right shoulder.  Right arm was placed in a sling.  The patient was awakened, extubated, and taken to the recovery room in stable condition.  Tracy A. Shuford, P.A.-C was used as an Environmental consultant throughout this case, was essential for help with positioning the patient, positioning the extremity, tissue manipulation, suture management, wound closure, and intraoperative decision making.     Metta Clines. Cyrene Gharibian, M.D.     KMS/MEDQ  D:  08/11/2016  T:  08/11/2016  Job:  [id number]

## 2016-08-11 NOTE — H&P (Signed)
Valerie Yates    Chief Complaint: Right shoulder anterior instability HPI: The patient is a 41 y.o. female with chronic right shoulder pain and clinical evidence of anterior vs multidirectional instability  Past Medical History:  Diagnosis Date  . Anxiety   . Complication of anesthesia    woke up before they took the endotra. tube and tries to take out per patient  . ESRD (end stage renal disease) on dialysis ([place])    Mon, Wed, Fri. at [facility] on [address]  . GERD (gastroesophageal reflux disease)   . Headache    "after dialysis" reports that only when excessive fluid is removed at dialysis  . Heart murmur    reports that they cannot decide if it is murmur vs. flow from fistula in left arm  . History of kidney infection   . Hyperparathyroidism due to renal insufficiency ([place])   . Hypertension   . Iron deficiency anemia   . Migraine    "a few/year": (09/14/2015)  . Shortness of breath dyspnea    resolved per patient was due to fluid overload  . UTI (lower urinary tract infection)    recurrent    Past Surgical History:  Procedure Laterality Date  . ABDOMINAL HYSTERECTOMY  2006  . AV FISTULA PLACEMENT Left 01/27/2015   Procedure:  BASILIC VEIN TRANSPOSITION ;  Surgeon: Valerie Mould, MD;  Location: [facility];  Service: Vascular;  Laterality: Left;  . DILATION AND CURETTAGE OF UTERUS    . LAPAROSCOPIC CHOLECYSTECTOMY  2005  . NM MYOVIEW LTD  08/2014   No fixed or reversible perfusion defect - to suggest Infarct or Ischeima.. EF ~70%.    Marland Kitchen SHOULDER ARTHROSCOPY W/ ROTATOR CUFF REPAIR Left 2012  . TRANSTHORACIC ECHOCARDIOGRAM  04/2014   Low normal EF of 50-55%. No RWMA.  Tr MR/TR.  NORMAL  . TRANSTHORACIC ECHOCARDIOGRAM  10/27/2015   [facility] (in setting of volume overload - leading to HD): Mildly impaired LV function with EF of 45-50%. Pseudo-normal LV filling (GR 2 DD. Moderate MR and moderate TR. RVSP estimated at 52 mmHg.  . TUBAL LIGATION  2003    Family History   Problem Relation Age of Onset  . Diabetes Maternal Grandmother   . Cancer Mother     Breast  . Breast cancer Mother   . Heart disease Maternal Grandfather     Social History:  reports that she has never smoked. She has never used smokeless tobacco. She reports that she does not drink alcohol or use drugs.   Medications Prior to Admission  Medication Sig Dispense Refill  . acetaminophen (TYLENOL) 500 MG tablet Take 1,000 mg by mouth every 6 (six) hours as needed for mild pain.    Marland Kitchen ALPRAZolam (XANAX) 0.5 MG tablet Take 0.5 mg by mouth 3 (three) times daily.     . carvedilol (COREG) 25 MG tablet Take 25 mg by mouth 2 (two) times daily with a meal.   6  . lidocaine-prilocaine (EMLA) cream Apply 1 application topically as needed. Apply prior to dialysis.    Marland Kitchen lisinopril (PRINIVIL,ZESTRIL) 40 MG tablet Take 40 mg by mouth at bedtime.   6  . metoCLOPramide (REGLAN) 5 MG tablet Take 5 mg by mouth 3 (three) times daily before meals.  1  . omeprazole (PRILOSEC) 40 MG capsule Take 1 capsule (40 mg total) by mouth daily. 90 capsule 3  . ondansetron (ZOFRAN) 4 MG tablet Take 1 tablet (4 mg total) by mouth every 8 (eight) hours. [place]  tablet 3  . oxyCODONE-acetaminophen (PERCOCET/ROXICET) 5-325 MG tablet Take 1 tablet by mouth every 6 (six) hours as needed for severe pain.    Marland Kitchen RENVELA 800 MG tablet Take 1,600 mg by mouth 3 (three) times daily before meals.  6     Physical Exam: right shoulder with painful and guarded motion as noted at recent office visits  Vitals  Temp:  [98.1 F (36.7 C)] 98.1 F (36.7 C) (04/05 0724) Pulse Rate:  [76-80] 76 (04/05 0900) Resp:  [10-18] 13 (04/05 0900) BP: (164-207)/(92-118) 191/97 (04/05 0900) SpO2:  [100 %] 100 % (04/05 0900) Weight:  [49.9 kg (110 lb)] 49.9 kg (110 lb) (04/05 0724)  Assessment/Plan  Impression: Right shoulder anterior instability  Plan of Action: Procedure(s): RIGHT SHOULDER ARTHROSCOPY WITH LABRAL REPAIR versus capsulolabral  reconstruction, possible bursectomy  Valerie Yates 08/11/2016, 9:30 AM Contact # [phone]

## 2016-08-11 NOTE — Progress Notes (Signed)
Pt. Has pre-existing AVF in LUE-positive bruit & thrill.

## 2016-08-12 ENCOUNTER — Encounter (HOSPITAL_COMMUNITY): Payer: Self-pay | Admitting: Orthopedic Surgery

## 2016-08-12 NOTE — Anesthesia Postprocedure Evaluation (Signed)
Anesthesia Post Note  Patient: Valerie Yates  Procedure(s) Performed: Procedure(s) (LRB): RIGHT SHOULDER ARTHROSCOPY WITH LABRAL REPAIR (Right)  Patient location during evaluation: PACU Anesthesia Type: General Level of consciousness: awake and alert and patient cooperative Pain management: pain level controlled Vital Signs Assessment: post-procedure vital signs reviewed and stable Respiratory status: spontaneous breathing and respiratory function stable Cardiovascular status: stable Anesthetic complications: no        Last Vitals:  Vitals:   08/11/16 1305 08/11/16 1306  BP: (!) 164/88   Pulse: 70 71  Resp: (!) 24   Temp: 36.3 C     Last Pain:  Vitals:   08/11/16 1305  TempSrc:   PainSc: 0-No pain   Pain Goal: Patients Stated Pain Goal: 4 (08/11/16 0749)               [place]

## 2016-08-16 ENCOUNTER — Other Ambulatory Visit (HOSPITAL_COMMUNITY): Payer: Medicare Other

## 2016-08-17 ENCOUNTER — Other Ambulatory Visit: Payer: Self-pay | Admitting: Gastroenterology

## 2016-08-22 ENCOUNTER — Telehealth: Payer: Self-pay | Admitting: *Deleted

## 2016-08-22 NOTE — Telephone Encounter (Signed)
A message was left to call our office to reschedule her echocardiogram.

## 2016-08-23 ENCOUNTER — Encounter: Payer: Self-pay | Admitting: Cardiology

## 2016-09-06 ENCOUNTER — Other Ambulatory Visit: Payer: Self-pay

## 2016-09-06 ENCOUNTER — Telehealth: Payer: Self-pay

## 2016-09-06 MED ORDER — ONDANSETRON HCL 4 MG PO TABS: 4.0000 mg | ORAL_TABLET | Freq: Three times a day (TID) | ORAL | 3 refills | Status: DC

## 2016-09-06 NOTE — Telephone Encounter (Signed)
Received fax refill request from [facility] for Zofran. Per Dr. Havery Moros note on pts pathology report in Feb pt can use this medication as needed for nausea. Zofran ordered as previous.

## 2016-09-30 ENCOUNTER — Other Ambulatory Visit: Payer: Self-pay | Admitting: Gastroenterology

## 2016-10-25 ENCOUNTER — Ambulatory Visit: Payer: Medicare Other | Admitting: Cardiology

## 2016-11-17 ENCOUNTER — Other Ambulatory Visit: Payer: Self-pay | Admitting: Gastroenterology

## 2016-12-08 ENCOUNTER — Other Ambulatory Visit: Payer: Self-pay

## 2016-12-08 ENCOUNTER — Telehealth: Payer: Self-pay | Admitting: Gastroenterology

## 2016-12-08 MED ORDER — ONDANSETRON HCL 4 MG PO TABS: 4.0000 mg | ORAL_TABLET | Freq: Three times a day (TID) | ORAL | 3 refills | Status: DC | PRN

## 2016-12-08 NOTE — Telephone Encounter (Signed)
Zofran sent to [facility]. Sig Take 4-8mg  every 8 hours prn per Dr Doyne Keel note in January 2018.

## 2017-03-06 ENCOUNTER — Telehealth: Payer: Self-pay | Admitting: Cardiology

## 2017-03-06 DIAGNOSIS — R931 Abnormal findings on diagnostic imaging of heart and coronary circulation: Secondary | ICD-10-CM

## 2017-03-06 DIAGNOSIS — I340 Nonrheumatic mitral (valve) insufficiency: Secondary | ICD-10-CM

## 2017-03-06 DIAGNOSIS — I420 Dilated cardiomyopathy: Secondary | ICD-10-CM

## 2017-03-06 NOTE — Telephone Encounter (Signed)
New Message      Pt requesting that you put in order for ETT and Echo please schedule on Tuesday or Thursday

## 2017-03-06 NOTE — Telephone Encounter (Signed)
SPOKE TO PATIENT. PATIENT STATES SHE NEEDS WORK UP FOR KIDNEY TRANSPLANT AT [facility].   INFORMED PATIENT WILL BE ABLE TO ORDER ECHO SINCE SHE DID NOT HAVE IT IN MARCH 2018. WILL HAVE DISCUSS WITH DR HARDING CONCERNING STRESS TEST.  APPOINTMENT SCHEDULE FOR DEC 2018 TO DISCUS RESULTS.

## 2017-03-07 NOTE — Telephone Encounter (Signed)
I agree.  She was supposed to have an echocardiogram and follow-up in June.  We should go and have her get the echocardiogram and then we can discuss what to do in December. I honestly do not think she needs a stress test, we can discuss when I see her.  Glenetta Hew, MD

## 2017-03-09 HISTORY — PX: TRANSTHORACIC ECHOCARDIOGRAM: SHX275

## 2017-03-16 NOTE — Telephone Encounter (Signed)
Spoke with patient.informed her that echo will order , await for follow up appointment to see if other test are needed. Aware scheduler will contact her.verbalized understanding.

## 2017-03-21 ENCOUNTER — Encounter (INDEPENDENT_AMBULATORY_CARE_PROVIDER_SITE_OTHER): Payer: Self-pay

## 2017-03-21 ENCOUNTER — Ambulatory Visit (HOSPITAL_COMMUNITY): Payer: Medicare Other | Attending: Cardiology

## 2017-03-21 ENCOUNTER — Other Ambulatory Visit: Payer: Self-pay

## 2017-03-21 DIAGNOSIS — I340 Nonrheumatic mitral (valve) insufficiency: Secondary | ICD-10-CM | POA: Diagnosis present

## 2017-03-21 DIAGNOSIS — I420 Dilated cardiomyopathy: Secondary | ICD-10-CM | POA: Insufficient documentation

## 2017-03-21 DIAGNOSIS — R931 Abnormal findings on diagnostic imaging of heart and coronary circulation: Secondary | ICD-10-CM | POA: Diagnosis not present

## 2017-03-21 DIAGNOSIS — I10 Essential (primary) hypertension: Secondary | ICD-10-CM | POA: Insufficient documentation

## 2017-04-10 NOTE — Progress Notes (Signed)
PCP: Enid Skeens., MD NEPHROLOGY: Dr. Moshe Cipro; Juanell Fairly, BP   Clinic Note: Chief Complaint  Patient presents with  . Follow-up    We look echo  . Cardiomyopathy    ? hypertensive  . Cardiac Valve Problem    MR & TR  . Hypertension    HPI: Valerie Yates is a 41 y.o. female with a PMH below who presents today for follow-up cardiology consultation for an abnormal echocardiogram (unfortunate not available at time of her initial visit -that showed mild to moderate the reduced EF with moderate MR and TR in the setting of volume overload).  Echocardiogram was performed for murmur identified by her Nephrology team (Dr. Mercy Moore and Juanell Fairly, NP). 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   Valerie Yates was last seen in March to follow-up in episode of heart failure/left-sided chest pain back in January 2018.  Apparently she had not been on her blood pressure medications.  She was noted to have pulmonary edema with moderate MR and TR --> symptoms improved after dialysis.  She felt better after dialysis..  Once she was given her blood pressure meds, she felt better.  When I saw her in follow-up she was doing much better.  She changed nephrologist to Dr. Moshe Cipro and her blood pressures been much better controlled as she has been allowed to complete her dialysis.  She noted a few palpitations.  Otherwise nothing significant.  Recent Hospitalizations:   April 2018 admitted for right shoulder arthroscopic surgery.  Studies Reviewed:   Transthoracic echo 03/21/2017: Mild LVH.  Mildly reduced EF of 45-50%.  GRII DD.  Mild MR.  Moderate LA dilation.  Mild TR (improved from moderate MR and TR on last echo, suggestive of functional regurgitation due to LV dilation from volume overload  Interval History: Valerie Yates presents today for follow-up to discuss the results of her echocardiogram for reassessing mitral valve regurgitation.  This did show  improved mitral valve function with less significant MR and TR but did still have a slightly reduced EF.   And returns today feeling quite well.  She really has no major complaints to speak of.  She has some nighttime coughing, but not real PND or orthopnea type symptoms.  She still feels better but has yet to get back to her baseline level of energy.  She notes some exertional fatigue more so than dyspnea.  She has not had any chest tightness or pressure however.  She occasionally has rare skipping heartbeats but more notably when her blood pressure goes up.  Also recently she had some fast heartbeats when she was visiting her husband sick in the hospital.  She has not had any syncope or near syncopal type symptoms.  No symptoms consistent with her prior heart failure exacerbation where she had profound dyspnea and PND, orthopnea.  No edema. She occasionally has headaches after hemodialysis and her legs bother her when she is on dialysis.  Overall, she says that her blood pressures have stabilized much more since she is switched back to Dr. Moshe Cipro for her nephrology care.  She is doing much better with dialysis and is maintaining good steady dry weight.  She does note that she will get fatigue especially if she is having more fluid drawn off. The brief heart rate spell episodes that she was noticing before they make her feel somewhat high are not happening nearly as frequently.   Cardiovascular Review of Symptoms: positive for - Some mild  exertional dyspnea, occasional palpitations, low energy. negative for - chest pain, orthopnea, paroxysmal nocturnal dyspnea, shortness of breath or TIA/amaurosis fugax.  No claudication. - but legs can feel weak (like jelly)   ROS: A comprehensive was performed. Review of Systems  Constitutional: Positive for weight loss (Finally he seems to be stabilizing and hoping to start gaining some).  HENT: Negative for congestion and nosebleeds.   Respiratory:  Negative for cough and shortness of breath.   Cardiovascular:       Per history of present illness No further episodes of chest pain noted in January.  Gastrointestinal: Positive for nausea (Sometimes in dialysis, and other times when her blood pressures are high.).  Musculoskeletal: Negative for joint pain and myalgias.  Skin: Negative.   Neurological: Positive for dizziness (Occasionally, when her blood pressure gets high) and headaches (1 blood pressure gets up.  And occasionally during dialysis). Negative for tingling, sensory change, speech change and focal weakness.  Endo/Heme/Allergies: Bruises/bleeds easily.  Psychiatric/Behavioral: Negative for depression and memory loss. The patient is nervous/anxious. The patient does not have insomnia.     Past Medical History:  Diagnosis Date  . Anxiety   . Complication of anesthesia    woke up before they took the endotra. tube and tries to take out per patient  . ESRD (end stage renal disease) on dialysis ([place])    Mon, Wed, Fri. at [facility] on [address]  . GERD (gastroesophageal reflux disease)   . Headache    "after dialysis" reports that only when excessive fluid is removed at dialysis  . Heart murmur    reports that they cannot decide if it is murmur vs. flow from fistula in left arm  . History of kidney infection   . Hyperparathyroidism due to renal insufficiency ([place])   . Hypertension   . Iron deficiency anemia   . Migraine    "a few/year": (09/14/2015)  . Shortness of breath dyspnea    resolved per patient was due to fluid overload  . UTI (lower urinary tract infection)    recurrent    Past Surgical History:  Procedure Laterality Date  . ABDOMINAL HYSTERECTOMY  2006  . AV FISTULA PLACEMENT Left 01/27/2015   Procedure:  BASILIC VEIN TRANSPOSITION ;  Surgeon: Angelia Mould, MD;  Location: [facility];  Service: Vascular;  Laterality: Left;  . DILATION AND CURETTAGE OF UTERUS    . LAPAROSCOPIC CHOLECYSTECTOMY  2005  . NM  MYOVIEW LTD  08/2014   No fixed or reversible perfusion defect - to suggest Infarct or Ischeima.. EF ~70%.    Valerie Yates Kitchen SHOULDER ARTHROSCOPY W/ ROTATOR CUFF REPAIR Left 2012  . SHOULDER ARTHROSCOPY WITH LABRAL REPAIR Right 08/11/2016   Procedure: RIGHT SHOULDER ARTHROSCOPY WITH LABRAL REPAIR;  Surgeon: Justice Britain, MD;  Location: [facility];  Service: Orthopedics;  Laterality: Right;  . TRANSTHORACIC ECHOCARDIOGRAM  04/2014   Low normal EF of 50-55%. No RWMA.  Tr MR/TR.  NORMAL  . TRANSTHORACIC ECHOCARDIOGRAM  10/27/2015   [facility] (in setting of volume overload - leading to HD): Mildly impaired LV function with EF of 45-50%. Pseudo-normal LV filling (GR 2 DD. Moderate MR and moderate TR. RVSP estimated at 52 mmHg.  Valerie Yates Kitchen TRANSTHORACIC ECHOCARDIOGRAM  03/2017   Mild LVH.  Mildly reduced EF of 45-50%.  GRII DD.  Mild MR.  Moderate LA dilation.  Mild TR (improved from moderate MR and TR on last echo, suggestive of functional regurgitation due to LV dilation from volume overload  . TUBAL  LIGATION  2003    Current Meds  Medication Sig  . acetaminophen (TYLENOL) 500 MG tablet Take 1,000 mg by mouth every 6 (six) hours as needed for mild pain.  Valerie Yates Kitchen ALPRAZolam (XANAX) 0.5 MG tablet Take 0.5 mg by mouth 3 (three) times daily.   . carvedilol (COREG) 25 MG tablet Take 25 mg by mouth 2 (two) times daily with a meal.   . lidocaine-prilocaine (EMLA) cream Apply 1 application topically as needed. Apply prior to dialysis.  Valerie Yates Kitchen lisinopril (PRINIVIL,ZESTRIL) 40 MG tablet Take 40 mg by mouth at bedtime.   . ondansetron (ZOFRAN) 4 MG tablet Take 1-2 tablets (4-8 mg total) by mouth every 8 (eight) hours as needed for nausea or vomiting.  Valerie Yates Kitchen oxyCODONE-acetaminophen (PERCOCET) 5-325 MG tablet Take 1-2 tablets by mouth every 4 (four) hours as needed.  Valerie Yates Kitchen RENVELA 800 MG tablet Take 1,600 mg by mouth 3 (three) times daily before meals.  . [DISCONTINUED] cyclobenzaprine (FLEXERIL) 10 MG tablet Take 1 tablet (10 mg total) by mouth 3  (three) times daily as needed for muscle spasms.  . [DISCONTINUED] metoCLOPramide (REGLAN) 5 MG tablet Take 5 mg by mouth 3 (three) times daily before meals.  . [DISCONTINUED] omeprazole (PRILOSEC) 40 MG capsule Take 1 capsule (40 mg total) by mouth daily.  . [DISCONTINUED] ondansetron (ZOFRAN) 4 MG tablet Take 1 tablet (4 mg total) by mouth every 8 (eight) hours as needed for nausea or vomiting.  . [DISCONTINUED] oxyCODONE-acetaminophen (PERCOCET/ROXICET) 5-325 MG tablet Take 1 tablet by mouth every 6 (six) hours as needed for severe pain.    Allergies  Allergen Reactions  . Azithromycin Anaphylaxis and Rash  . Erythromycin Hives, Shortness Of Breath and Rash    Acne Aid-Dermatological.  . Penicillins Anaphylaxis, Hives and Shortness Of Breath    Has patient had a PCN reaction causing immediate rash, facial/tongue/throat swelling, SOB or lightheadedness with hypotension: #  #  #  YES  #  #  #  Has patient had a PCN reaction causing SEVERE RASH INVOLVING MUCUS MEMBRANES or SKIN NECROSIS: #  #  #  YES  #  #  #  Has patient had a PCN reaction that required hospitalization #  #  #  YES  #  #  #  Has patient had a PCN reaction occurring within the last 10 years:  #  #  #  NO  #  #  #    . Morphine Sulfate Rash    Tolerates with Benadryl    Social History   Tobacco Use  . Smoking status: Never Smoker  . Smokeless tobacco: Never Used  Substance Use Topics  . Alcohol use: No    Alcohol/week: 0.0 oz  . Drug use: No   Social History   Social History Narrative  . Not on file    Family History  Problem Relation Age of Onset  . Diabetes Maternal Grandmother   . Cancer Mother        Breast  . Breast cancer Mother   . Heart disease Maternal Grandfather     Wt Readings from Last 3 Encounters:  04/11/17 117 lb (53.1 kg)  08/11/16 110 lb (49.9 kg)  07/07/16 114 lb 6.4 oz (51.9 kg)    PHYSICAL EXAM BP 138/84   Pulse 74   Ht 5\' 7"  (1.702 m)   Wt 117 lb (53.1 kg)   BMI 18.32  kg/m   Physical Exam  Constitutional: She is oriented to person, place, and time.  She appears well-developed and well-nourished. No distress.  HENT:  Head: Normocephalic.  Neck: No JVD present.  Cardiovascular: Normal rate, regular rhythm and normal pulses.  No extrasystoles are present. PMI is not displaced. Exam reveals no gallop and no friction rub.  Murmur heard.  Medium-pitched harsh decrescendo holosystolic murmur is present with a grade of 3/6 at the upper left sternal border and lower left sternal border. Pulmonary/Chest: Effort normal and breath sounds normal. No respiratory distress. She has no wheezes. She has no rales.  Abdominal: Soft. Bowel sounds are normal. She exhibits no distension. There is no tenderness.  Musculoskeletal: Normal range of motion.  Neurological: She is alert and oriented to person, place, and time.  Skin: Skin is warm and dry. No rash noted. No erythema.  Psychiatric: She has a normal mood and affect. Her behavior is normal. Judgment and thought content normal.  Nursing note and vitals reviewed.   Adult ECG Report Sinus rhythm, rate 74 bpm.  Borderline criteria for LVH.  Lateral T wave inversions.  Borderline QT interval.  Relatively stable EKG.  Other studies Reviewed: Additional studies/ records that were reviewed today include:  Recent Labs: Not available   ASSESSMENT / PLAN: Problem List Items Addressed This Visit    Abnormal echocardiogram findings without diagnosis (Chronic)    I suspected that her findings are related to hypertensive cardiomyopathy, however need to exclude ischemic CAD in preparation for possible renal transplant. Repeat echocardiogram showed much improved mitral and tricuspid valve regurgitation, but she still has persistent grade 2 diastolic dysfunction suggestive of heart disease.  We will check Myoview to exclude ischemia.      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  Overall, the follow-up echo shows mild improvement in EF but persistent grade 2 diastolic dysfunction with mild mitral regurgitation as opposed to moderate. This would indicate that she needs to continue to control her blood pressure. Volume is controlled by dialysis.  She is on high-dose carvedilol and lisinopril.  She is not having any more chest pain, however plan was going to be to exclude ischemic etiology as part of her preop evaluation for possible treatment of transplant.  Plan: Lexiscan Myoview      Relevant Orders   EKG 12-Lead (Completed)   MYOCARDIAL PERFUSION IMAGING   Mitral valvular regurgitation - Primary (Chronic)    As expected, the MR and TR noted on her previous echo were related to abnormal volume overload M a more functional than true.  Minimal murmurs on exam.  Reassuring results of echo. With persistent mitral valve regurgitation, however we do need to continue tight blood pressure control.      Relevant Orders   MYOCARDIAL PERFUSION IMAGING   Poorly-controlled hypertension (Chronic)    Blood pressure looks better today.  Nephrology is definitely having out.  Currently on max dose carvedilol and lisinopril. Option may want to consider hydralazine or amlodipine for afterload reduction potential.      Preop cardiovascular exam    Thankfully, she is relatively young has no evidence or history of stroke, is not diabetic (therefore on insulin).  She is not actively any heart failure symptoms controlled more by dialysis that he also. She does not actively have any angina symptoms at present, but has been having intermittent chest pain before this plan was to exclude CAD with a Myoview stress test.  Pending the results of Myoview stress test, we can better stratify  for renal transplant.      Relevant Orders   EKG 12-Lead (Completed)   MYOCARDIAL PERFUSION IMAGING      Current medicines are reviewed at length with the  patient today. (+/- concerns)  The following changes have been made:   Patient Instructions  No medication changes   SCHEDULE AT [place] has requested that you have en exercise stress myoview. For further information please visit [email or web address]. Please follow instruction sheet, as given.    Your physician recommends that you schedule a follow-up appointment in [place].   If you need a refill on your cardiac medications before your next appointment, please call your pharmacy.   Studies Ordered:   Orders Placed This Encounter  Procedures  . MYOCARDIAL PERFUSION IMAGING  . EKG 12-Lead     Glenetta Hew, M.D., M.S. Interventional Cardiologist   Pager # [phone] Phone # [phone] [address] [place], [place] [address]

## 2017-04-11 ENCOUNTER — Ambulatory Visit (INDEPENDENT_AMBULATORY_CARE_PROVIDER_SITE_OTHER): Payer: Medicare Other | Admitting: Cardiology

## 2017-04-11 ENCOUNTER — Encounter: Payer: Self-pay | Admitting: Cardiology

## 2017-04-11 VITALS — BP 138/84 | HR 74 | Ht 67.0 in | Wt 117.0 lb

## 2017-04-11 DIAGNOSIS — I420 Dilated cardiomyopathy: Secondary | ICD-10-CM

## 2017-04-11 DIAGNOSIS — I340 Nonrheumatic mitral (valve) insufficiency: Secondary | ICD-10-CM | POA: Diagnosis not present

## 2017-04-11 DIAGNOSIS — Z01810 Encounter for preprocedural cardiovascular examination: Secondary | ICD-10-CM

## 2017-04-11 DIAGNOSIS — R931 Abnormal findings on diagnostic imaging of heart and coronary circulation: Secondary | ICD-10-CM

## 2017-04-11 DIAGNOSIS — I10 Essential (primary) hypertension: Secondary | ICD-10-CM

## 2017-04-11 NOTE — Patient Instructions (Addendum)
No medication changes   SCHEDULE AT [place] Your physician has requested that you have en exercise stress myoview. For further information please visit [email or web address]. Please follow instruction sheet, as given.    Your physician recommends that you schedule a follow-up appointment in [place].   If you need a refill on your cardiac medications before your next appointment, please call your pharmacy.

## 2017-04-13 ENCOUNTER — Encounter: Payer: Self-pay | Admitting: Cardiology

## 2017-04-13 NOTE — Assessment & Plan Note (Signed)
Again probably related to volume overload and hypertension.  Overall, the follow-up echo shows mild improvement in EF but persistent grade 2 diastolic dysfunction with mild mitral regurgitation as opposed to moderate. This would indicate that she needs to continue to control her blood pressure. Volume is controlled by dialysis.  She is on high-dose carvedilol and lisinopril.  She is not having any more chest pain, however plan was going to be to exclude ischemic etiology as part of her preop evaluation for possible treatment of transplant.  Plan: [employer]

## 2017-04-13 NOTE — Assessment & Plan Note (Signed)
As expected, the MR and TR noted on her previous echo were related to abnormal volume overload M a more functional than true.  Minimal murmurs on exam.  Reassuring results of echo. With persistent mitral valve regurgitation, however we do need to continue tight blood pressure control.

## 2017-04-13 NOTE — Assessment & Plan Note (Signed)
I suspected that her findings are related to hypertensive cardiomyopathy, however need to exclude ischemic CAD in preparation for possible renal transplant. Repeat echocardiogram showed much improved mitral and tricuspid valve regurgitation, but she still has persistent grade 2 diastolic dysfunction suggestive of heart disease.  We will check Myoview to exclude ischemia.

## 2017-04-13 NOTE — Assessment & Plan Note (Signed)
Thankfully, she is relatively young has no evidence or history of stroke, is not diabetic (therefore on insulin).  She is not actively any heart failure symptoms controlled more by dialysis that he also. She does not actively have any angina symptoms at present, but has been having intermittent chest pain before this plan was to exclude CAD with a Myoview stress test.  Pending the results of Myoview stress test, we can better stratify for renal transplant.

## 2017-04-13 NOTE — Assessment & Plan Note (Addendum)
Blood pressure looks better today.  Nephrology is definitely having out.  Currently on max dose carvedilol and lisinopril. Option may want to consider hydralazine or amlodipine for afterload reduction potential.

## 2017-04-14 ENCOUNTER — Telehealth (HOSPITAL_COMMUNITY): Payer: Self-pay

## 2017-04-14 NOTE — Telephone Encounter (Signed)
Encounter complete. 

## 2017-04-20 ENCOUNTER — Telehealth (HOSPITAL_COMMUNITY): Payer: Self-pay

## 2017-04-20 ENCOUNTER — Ambulatory Visit (HOSPITAL_COMMUNITY)
Admission: RE | Admit: 2017-04-20 | Payer: Medicare Other | Source: Ambulatory Visit | Attending: Cardiology | Admitting: Cardiology

## 2017-04-20 NOTE — Telephone Encounter (Signed)
Encounter complete. 

## 2017-04-25 ENCOUNTER — Ambulatory Visit (HOSPITAL_COMMUNITY): Payer: Medicare Other

## 2017-04-27 ENCOUNTER — Telehealth (HOSPITAL_COMMUNITY): Payer: Self-pay

## 2017-04-27 NOTE — Telephone Encounter (Signed)
Encounter complete. 

## 2017-05-03 ENCOUNTER — Telehealth (HOSPITAL_COMMUNITY): Payer: Self-pay

## 2017-05-03 NOTE — Telephone Encounter (Signed)
Encounter complete. 

## 2017-05-04 ENCOUNTER — Ambulatory Visit (HOSPITAL_COMMUNITY)
Admission: RE | Admit: 2017-05-04 | Discharge: 2017-05-04 | Disposition: A | Discharge status: Home / Self Care | Payer: Medicare Other | Source: Ambulatory Visit | Attending: Cardiology | Admitting: Cardiology

## 2017-05-04 ENCOUNTER — Encounter: Payer: Self-pay | Admitting: *Deleted

## 2017-05-04 DIAGNOSIS — R42 Dizziness and giddiness: Secondary | ICD-10-CM | POA: Insufficient documentation

## 2017-05-04 DIAGNOSIS — Z8249 Family history of ischemic heart disease and other diseases of the circulatory system: Secondary | ICD-10-CM | POA: Insufficient documentation

## 2017-05-04 DIAGNOSIS — R110 Nausea: Secondary | ICD-10-CM | POA: Diagnosis not present

## 2017-05-04 DIAGNOSIS — I420 Dilated cardiomyopathy: Secondary | ICD-10-CM | POA: Insufficient documentation

## 2017-05-04 DIAGNOSIS — I132 Hypertensive heart and chronic kidney disease with heart failure and with stage 5 chronic kidney disease, or end stage renal disease: Secondary | ICD-10-CM | POA: Insufficient documentation

## 2017-05-04 DIAGNOSIS — R0600 Dyspnea, unspecified: Secondary | ICD-10-CM | POA: Diagnosis not present

## 2017-05-04 DIAGNOSIS — N186 End stage renal disease: Secondary | ICD-10-CM | POA: Insufficient documentation

## 2017-05-04 DIAGNOSIS — Z992 Dependence on renal dialysis: Secondary | ICD-10-CM | POA: Insufficient documentation

## 2017-05-04 DIAGNOSIS — I509 Heart failure, unspecified: Secondary | ICD-10-CM | POA: Insufficient documentation

## 2017-05-04 DIAGNOSIS — Z01810 Encounter for preprocedural cardiovascular examination: Secondary | ICD-10-CM

## 2017-05-04 DIAGNOSIS — I340 Nonrheumatic mitral (valve) insufficiency: Secondary | ICD-10-CM | POA: Diagnosis not present

## 2017-05-04 LAB — MYOCARDIAL PERFUSION IMAGING
CHL CUP NUCLEAR SRS: 0
CHL CUP NUCLEAR SSS: 3
CSEPPHR: 133 {beats}/min
LV sys vol: 83 mL
LVDIAVOL: 168 mL (ref 46–106)
Rest HR: 77 {beats}/min
SDS: 3
TID: 0.9

## 2017-05-04 MED ORDER — TECHNETIUM TC 99M TETROFOSMIN IV KIT
32.1000 | PACK | Freq: Once | INTRAVENOUS | Status: AC | PRN
  Administered 2017-05-04: 32.1 via INTRAVENOUS
  Filled 2017-05-04: qty 33

## 2017-05-04 MED ORDER — TECHNETIUM TC 99M TETROFOSMIN IV KIT
10.2000 | PACK | Freq: Once | INTRAVENOUS | Status: AC | PRN
  Administered 2017-05-04: 10.2 via INTRAVENOUS
  Filled 2017-05-04: qty 11

## 2017-05-04 MED ORDER — AMINOPHYLLINE 25 MG/ML IV SOLN
75.0000 mg | Freq: Once | INTRAVENOUS | Status: AC
  Administered 2017-05-04: 75 mg via INTRAVENOUS

## 2017-05-04 MED ORDER — REGADENOSON 0.4 MG/5ML IV SOLN
0.4000 mg | Freq: Once | INTRAVENOUS | Status: AC
  Administered 2017-05-04: 0.4 mg via INTRAVENOUS

## 2017-05-04 NOTE — Progress Notes (Signed)
Stress Test looked good!! No sign of significant Heart Artery Disease.  Pump function is normal. High BP response to exercise -- could explain shortness of breath with exertion.  Good news!!.  Leonie Man, MD  Pls fwd to PCP: Enid Skeens., MD

## 2017-05-15 ENCOUNTER — Encounter: Payer: Self-pay | Admitting: *Deleted

## 2017-05-15 ENCOUNTER — Ambulatory Visit: Payer: Medicare Other | Admitting: Cardiology

## 2017-06-16 ENCOUNTER — Telehealth: Payer: Self-pay | Admitting: *Deleted

## 2017-06-16 NOTE — Telephone Encounter (Signed)
-----   Message from Leonie Man, MD sent at 05/04/2017  3:24 PM EST ----- Stress Test looked good!! No sign of significant Heart Artery Disease.  Pump function is normal. High BP response to exercise -- could explain shortness of breath with exertion.  Good news!!.  Leonie Man, MD  Pls fwd to PCP: Enid Skeens., MD

## 2017-06-16 NOTE — Telephone Encounter (Signed)
MAILED LETTER OF STRESS RESULTS

## 2017-08-15 ENCOUNTER — Telehealth: Payer: Self-pay | Admitting: Cardiology

## 2017-08-15 NOTE — Telephone Encounter (Signed)
New Message  Pt c/o BP issue: STAT if pt c/o blurred vision, one-sided weakness or slurred speech  1. What are your last 5 BP readings? 200/112, 220/120, 583/462,194/71, 179/97, and 197/101  2. Are you having any other symptoms (ex. Dizziness, headache, blurred vision, passed out)? Chest tightness and had a panic attack  3. What is your BP issue? Pt states that her bp has been running high and she is already on 4 different bp meds and wants to know what she should do. Please call

## 2017-08-15 NOTE — Telephone Encounter (Addendum)
Returned call to patient of Dr. Ellyn Hack. She had a panic attack on Sunday night - 3 in a row that were really bad. BP was 170/101, 175/105 on Sunday night during panic attack, she felt like someone was sitting on her chest and she couldn't breathe.   She is currently on dialysis and her nephrologist was monitoring her BP/adjusting meds. She states she has a different doctor all the time and they all adjust her meds differently. She is currently on lisinopril 40mg , toprol 100mg , clonidine 0.1mg  BID  BP readings provided are from 4/8 within a 3 hour time frame.  200/112, 220/120, 212/109, 187/87, 179/97, 197/101  Scheduled for appointment 4/10 with Arnold Long, DNP @ 130pm. She could not make an early AM appt on 4/10 with MD as she has dialysis.   Routed to MD as Juluis Rainier

## 2017-08-15 NOTE — Addendum Note (Signed)
Addended by: Fidel Levy on: 08/15/2017 11:30 AM   Modules accepted: Orders

## 2017-08-16 ENCOUNTER — Ambulatory Visit (INDEPENDENT_AMBULATORY_CARE_PROVIDER_SITE_OTHER): Payer: Medicare Other | Admitting: Adult Health

## 2017-08-16 ENCOUNTER — Encounter: Payer: Self-pay | Admitting: Adult Health

## 2017-08-16 VITALS — BP 180/102 | HR 85 | Ht 67.0 in | Wt 120.0 lb

## 2017-08-16 DIAGNOSIS — N186 End stage renal disease: Secondary | ICD-10-CM

## 2017-08-16 DIAGNOSIS — I10 Essential (primary) hypertension: Secondary | ICD-10-CM

## 2017-08-16 DIAGNOSIS — Z992 Dependence on renal dialysis: Secondary | ICD-10-CM | POA: Diagnosis not present

## 2017-08-16 DIAGNOSIS — I5032 Chronic diastolic (congestive) heart failure: Secondary | ICD-10-CM

## 2017-08-16 DIAGNOSIS — R000 Tachycardia, unspecified: Secondary | ICD-10-CM

## 2017-08-16 MED ORDER — HYDRALAZINE HCL 25 MG PO TABS: 12.5000 mg | ORAL_TABLET | Freq: Three times a day (TID) | ORAL | 3 refills | Status: DC

## 2017-08-16 NOTE — Patient Instructions (Signed)
Medication Instructions:  TAPER OFF CLONIDINE TAKE DOSE TONIGHT; Thursday HOLD AM DOSE AND TAKE 1/2 PM DOSE THEN Friday STOP  START HYDRALAZINE 12.5MG  THREE TIMES DAILY  If you need a refill on your cardiac medications before your next appointment, please call your pharmacy.  Testing/Procedures: Your physician has recommended that you wear a 7 [place]. Event monitors are medical devices that record the heart's electrical activity. Doctors most often [place] these monitors to diagnose arrhythmias. Arrhythmias are problems with the speed or rhythm of the heartbeat. The monitor is a small, portable device. You can wear one while you do your normal daily activities. This is usually used to diagnose what is causing palpitations/syncope (passing out).This will be performed at our [facility] location - [address], [address].    Special Instructions: TAKE AND LOG YOUR BP THREE TIMES A DAY-MAKE SURE TO TAKE AT THE SAME TIMES DAILY  Follow-Up: Your physician wants you to follow-up in: 2 WEEKS WITH DR Ellyn Hack    Thank you for choosing [facility] at [facility]!!

## 2017-08-16 NOTE — Progress Notes (Signed)
Cardiology Office Note   Date:  08/16/2017   ID:  Valerie Yates, DOB 21-Oct-1975, MRN [id number]  PCP:  Enid Skeens., MD  Cardiologist: Dr. Ellyn Hack  No chief complaint on file.    History of Present Illness: Valerie Yates is a 42 y.o. female who presents for ongoing assessment and management after initially being seen by Dr. Ellyn Hack on 04/11/2017 as result of an abnormal echocardiogram with reduced LV function, moderate MR, and TR in the setting of volume overload.  Other history includes end-stage renal disease with dialysis, followed by Dr. Moshe Cipro for nephrology care.  Hypertension, and anxiety.  The patient was last seen on 04/11/2017 with complaints of exertional fatigue, mild dyspnea.  She denied any chest pain or pressure.  She did state that she was having some frequent palpitations.  On that visit Dr. Ellyn Hack ordered a echocardiogram for comparison to prior echo with reduced EF and myocardial perfusion study.  These were also completed in preparation for possible kidney transplant.  2D echo results: Stable ejection fraction of 45-50% (mildly reduced). Stable grade 2 (moderate) diastolic dysfunction, high filling pressures, but not as prominent mitral regurgitation, tricuspid regurgitation or right-sided filling pressures.  This basically indicates that the valvular regurgitation seen on the previous echo were related to volume overload.  NM Stress Test Study Highlights    The left ventricular ejection fraction is mildly decreased (45-54%).  Nuclear stress EF: 50%.  There was accentuation of the inferolateral ST/T wave abnormality at baseline  The study is normal.  This is a low risk study.   Echo 2D echo results: Stable ejection fraction of 45-50% (mildly reduced). Stable grade 2 (moderate) diastolic dysfunction, high filling pressures, but not as prominent mitral regurgitation, tricuspid regurgitation or right-sided filling pressures.  This basically indicates that  the valvular regurgitation seen on the previous echo were related to volume overload. Continue current meds.  The patient comes today frustrated.  She is followed by nephrology, OB/GYN, and family practice along with our office.  Each of her separate positions have been manipulated her antihypertensive medications, taken her off of some added others.  As result of this her blood pressure is been extremely labile, and higher usually, rather than lower.  She states that during dialysis her blood pressure often rises to close to 200/110, machine alarms and she states that the nurses comes by and silences the alarm.  She also states that she has been having rapid heart rhythm occurring several times a week, this often leads to which she describes as a "panic attack" the heart rate increases, and then she begins to have trouble breathing and feeling panicky.  The patient does have Xanax 0.5 mg which she can take 3 times a day and will usually take it when she feels her heart rate going up to avoid a full blown panic attack.  The patient is still be considered for renal transplant, and this is planned by nephrology.  Dr. Moshe Cipro has left her particular dialysis clinic site and is now being followed by Dr. Augustin Coupe.  Past Medical History:  Diagnosis Date  . Anxiety   . Complication of anesthesia    woke up before they took the endotra. tube and tries to take out per patient  . ESRD (end stage renal disease) on dialysis ([place])    Mon, Wed, Fri. at [facility] on [address]  . GERD (gastroesophageal reflux disease)   . Headache    "after dialysis" reports that only when excessive fluid is removed  at dialysis  . Heart murmur    reports that they cannot decide if it is murmur vs. flow from fistula in left arm  . History of kidney infection   . Hyperparathyroidism due to renal insufficiency ([place])   . Hypertension   . Iron deficiency anemia   . Migraine    "a few/year": (09/14/2015)  . Shortness of breath  dyspnea    resolved per patient was due to fluid overload  . UTI (lower urinary tract infection)    recurrent    Past Surgical History:  Procedure Laterality Date  . ABDOMINAL HYSTERECTOMY  2006  . AV FISTULA PLACEMENT Left 01/27/2015   Procedure:  BASILIC VEIN TRANSPOSITION ;  Surgeon: Angelia Mould, MD;  Location: [facility];  Service: Vascular;  Laterality: Left;  . DILATION AND CURETTAGE OF UTERUS    . LAPAROSCOPIC CHOLECYSTECTOMY  2005  . NM MYOVIEW LTD  08/2014   No fixed or reversible perfusion defect - to suggest Infarct or Ischeima.. EF ~70%.    Marland Kitchen SHOULDER ARTHROSCOPY W/ ROTATOR CUFF REPAIR Left 2012  . SHOULDER ARTHROSCOPY WITH LABRAL REPAIR Right 08/11/2016   Procedure: RIGHT SHOULDER ARTHROSCOPY WITH LABRAL REPAIR;  Surgeon: Justice Britain, MD;  Location: [facility];  Service: Orthopedics;  Laterality: Right;  . TRANSTHORACIC ECHOCARDIOGRAM  04/2014   Low normal EF of 50-55%. No RWMA.  Tr MR/TR.  NORMAL  . TRANSTHORACIC ECHOCARDIOGRAM  10/27/2015   [facility] (in setting of volume overload - leading to HD): Mildly impaired LV function with EF of 45-50%. Pseudo-normal LV filling (GR 2 DD. Moderate MR and moderate TR. RVSP estimated at 52 mmHg.  Marland Kitchen TRANSTHORACIC ECHOCARDIOGRAM  03/2017   Mild LVH.  Mildly reduced EF of 45-50%.  GRII DD.  Mild MR.  Moderate LA dilation.  Mild TR (improved from moderate MR and TR on last echo, suggestive of functional regurgitation due to LV dilation from volume overload  . TUBAL LIGATION  2003     Current Outpatient Medications  Medication Sig Dispense Refill  . ALPRAZolam (XANAX) 0.5 MG tablet Take 0.5 mg by mouth 3 (three) times daily.     Marland Kitchen lidocaine-prilocaine (EMLA) cream Apply 1 application topically as needed. Apply prior to dialysis.    Marland Kitchen lisinopril (PRINIVIL,ZESTRIL) 40 MG tablet Take 40 mg by mouth at bedtime.   6  . metoprolol succinate (TOPROL-XL) 100 MG 24 hr tablet Take 100 mg by mouth daily. Take with or immediately following a  meal.    . ondansetron (ZOFRAN) 4 MG tablet Take 1-2 tablets (4-8 mg total) by mouth every 8 (eight) hours as needed for nausea or vomiting. 60 tablet 3  . oxyCODONE-acetaminophen (PERCOCET) 5-325 MG tablet Take 1-2 tablets by mouth every 4 (four) hours as needed. 40 tablet 0  . RENVELA 800 MG tablet Take 1,600 mg by mouth 3 (three) times daily before meals.  6  . hydrALAZINE (APRESOLINE) 25 MG tablet Take 0.5 tablets (12.5 mg total) by mouth 3 (three) times daily. 90 tablet 3   No current facility-administered medications for this visit.     Allergies:   Azithromycin; Erythromycin; Penicillins; Amlodipine; Valsartan; and Morphine sulfate    Social History:  The patient  reports that she has never smoked. She has never used smokeless tobacco. She reports that she does not drink alcohol or use drugs.   Family History:  The patient's family history includes Breast cancer in her mother; Cancer in her mother; Diabetes in her maternal grandmother; Heart disease  in her maternal grandfather.    ROS: All other systems are reviewed and negative. Unless otherwise mentioned in H&P    PHYSICAL EXAM: VS:  BP (!) 180/102   Pulse 85   Ht 5\' 7"  (1.702 m)   Wt 120 lb (54.4 kg)   BMI 18.79 kg/m  , BMI Body mass index is 18.79 kg/m. GEN: Well nourished, well developed, in no acute distress  HEENT: normal  Neck: no JVD, carotid bruits, or masses Cardiac: RRR; brisk systolic bruit prior to S1 and S2 (likely from dialysis shunt) no murmurs, rubs, or gallops,no edema  Respiratory:  clear to auscultation bilaterally, normal work of breathing GI: soft, nontender, nondistended, + BS MS: no deformity or atrophy  Skin: warm and dry, no rash Neuro:  Strength and sensation are intact Psych: euthymic mood, full affect   EKG: EKG reveals normal sinus rhythm heart rate of 85 bpm, LVH is noted, QT interval is slightly prolonged at 410 ms, with a QTC of 487 ms.  Recent Labs: No results found for requested  labs within last 8760 hours.    Lipid Panel No results found for: CHOL, TRIG, HDL, CHOLHDL, VLDL, LDLCALC, LDLDIRECT    Wt Readings from Last 3 Encounters:  08/16/17 120 lb (54.4 kg)  04/11/17 117 lb (53.1 kg)  08/11/16 110 lb (49.9 kg)      Other studies Reviewed: Echocardiogram and nuclear medicine stress test as above.  ASSESSMENT AND PLAN:  1.  Malignant hypertension: Multifactorial in the setting of end-stage renal disease.  Blood pressure has been labile and had multiple physicians manipulate her medications.  I have advised the patient to choose one physician to follow concerning blood pressure control and medication adjustments.  With so many adjustments with different positions it will be difficult to have consistency of care concerning this.  The patient states she wishes Dr. Ellyn Hack and cardiology to follow her blood pressure.  I have advised her to notify the other physicians when she sees them that she will have only one position following her blood pressure.  In the interim she is also been complaining that clonidine makes her feel fuzzy and dizzy.  He also was not helpful with blood pressure control.  She has not taken her clonidine this morning and she overslept.  Blood pressure is elevated here in the office at 180/102.  She is also not had dialysis as planned today because she missed her appointment.  She is planning on having dialysis tomorrow and is made a new appointment.  My plan will to wean her off of clonidine to avoid rebound hypertension.  She will take her dose this evening.  Tomorrow she will missed the morning dose and take a half a tablet of the clonidine tomorrow evening.  The third day she will stop her clonidine altogether.  The patient will have new prescription for hydralazine 12.5 mg twice daily making room for titration should blood pressure continue to be elevated.  Because of her height and weight I did not want to over dose her initially and caused  significant hypotension.  May be able to titrate up to 25 mg 3 times daily or even 50 mg 3 times daily if she is able to tolerate this medicine.  2.  Frequent episodes of tachycardia: I am going to place a 7-day cardiac monitor on the patient to evaluate rate, rhythm, and morphology to give [place] a better idea of what is occurring.  She is currently on metoprolol which she  is taking twice daily at 100 mg.  Will need to discuss changes in medication to assist with heart rate once we are clear as to how often this is occurring what rhythm and rate is identified.  3.  End-stage renal disease: Dialysis on Monday Wednesdays and Fridays, with nephrologist management and referral for kidney transplant.  Dr. Augustin Coupe is following.  I do not see where her renal artery ultrasound has been completed only a CT scan which was completed back in 2017.  Renal arteries were not identified, or commented on in CT scan.  4.  Anxiety about health issues: She will continue with Xanax as needed.  This will be followed by PCP as cardiology will not be managing her anxiety.  Current medicines are reviewed at length with the patient today.    Labs/ tests ordered today include: None followed by nephrology.   Phill Myron. West Pugh, ANP, AACC   08/16/2017 2:41 PM    [facility] [address] [place], [place] [address] Phone: [phone]; Fax: [phone]

## 2017-08-22 NOTE — Addendum Note (Signed)
Addended by: Leanord Asal T on: 08/22/2017 03:41 PM   Modules accepted: Orders

## 2017-08-24 ENCOUNTER — Telehealth: Payer: Self-pay | Admitting: Adult Health

## 2017-08-24 MED ORDER — HYDRALAZINE HCL 25 MG PO TABS: 25.0000 mg | ORAL_TABLET | Freq: Three times a day (TID) | ORAL | 3 refills | Status: DC

## 2017-08-24 MED ORDER — FUROSEMIDE 20 MG PO TABS: 20.0000 mg | ORAL_TABLET | Freq: Every day | ORAL | 3 refills | Status: DC

## 2017-08-24 NOTE — Telephone Encounter (Signed)
New Message:   Pt saw Jory Sims on 08-16-17 and was started on Hydralazine. She was told if she had any problem to call and let her know. Pt says her ankles are swollen like balloons.

## 2017-08-24 NOTE — Telephone Encounter (Signed)
I would recommend that she begin lasix 20 mg daily to help with the edema if this is approved by her nephrologist. She is not oliguric per her report. BP being elevated will require her to go up on hydralazine to 25 mg TID for better control. Still waiting on cardiac monitor to come back concerning elevated HR. Can consider adding beta blocker once we have identified her rhythm and rate. We will start with the lasix and the increased dose of hydralazine to TID dosing Have her call in a few days to let us know how she is.

## 2017-08-24 NOTE — Telephone Encounter (Signed)
Left detailed message on personalized VM. Rx's sent to requested pharmacy

## 2017-08-24 NOTE — Telephone Encounter (Signed)
Spoke with patient and per patient she was told to call in if she had any swelling after starting Hydralazine. After starting Hydralazine she started having the swelling but it has progressively gotten worse. Swelling does not go down at night. Denies any shortness of breath. Blood pressure has remained in the 150's/90's. Will forward to [place] for review

## 2017-09-04 ENCOUNTER — Observation Stay (HOSPITAL_COMMUNITY)
Admission: EM | Admit: 2017-09-04 | Discharge: 2017-09-06 | Disposition: A | Discharge status: Home / Self Care | Payer: Medicare Other | Attending: Internal Medicine | Admitting: Internal Medicine

## 2017-09-04 ENCOUNTER — Encounter (HOSPITAL_COMMUNITY): Payer: Self-pay | Admitting: Emergency Medicine

## 2017-09-04 ENCOUNTER — Emergency Department (HOSPITAL_COMMUNITY): Payer: Medicare Other

## 2017-09-04 DIAGNOSIS — I132 Hypertensive heart and chronic kidney disease with heart failure and with stage 5 chronic kidney disease, or end stage renal disease: Secondary | ICD-10-CM | POA: Diagnosis not present

## 2017-09-04 DIAGNOSIS — R51 Headache: Secondary | ICD-10-CM | POA: Insufficient documentation

## 2017-09-04 DIAGNOSIS — R0789 Other chest pain: Secondary | ICD-10-CM | POA: Diagnosis not present

## 2017-09-04 DIAGNOSIS — K219 Gastro-esophageal reflux disease without esophagitis: Secondary | ICD-10-CM | POA: Insufficient documentation

## 2017-09-04 DIAGNOSIS — R748 Abnormal levels of other serum enzymes: Secondary | ICD-10-CM | POA: Diagnosis not present

## 2017-09-04 DIAGNOSIS — Z880 Allergy status to penicillin: Secondary | ICD-10-CM | POA: Diagnosis not present

## 2017-09-04 DIAGNOSIS — I160 Hypertensive urgency: Secondary | ICD-10-CM | POA: Insufficient documentation

## 2017-09-04 DIAGNOSIS — I340 Nonrheumatic mitral (valve) insufficiency: Secondary | ICD-10-CM | POA: Insufficient documentation

## 2017-09-04 DIAGNOSIS — N2581 Secondary hyperparathyroidism of renal origin: Secondary | ICD-10-CM | POA: Diagnosis not present

## 2017-09-04 DIAGNOSIS — E875 Hyperkalemia: Secondary | ICD-10-CM | POA: Diagnosis not present

## 2017-09-04 DIAGNOSIS — Z885 Allergy status to narcotic agent status: Secondary | ICD-10-CM | POA: Diagnosis not present

## 2017-09-04 DIAGNOSIS — Z888 Allergy status to other drugs, medicaments and biological substances status: Secondary | ICD-10-CM | POA: Insufficient documentation

## 2017-09-04 DIAGNOSIS — Z881 Allergy status to other antibiotic agents status: Secondary | ICD-10-CM | POA: Diagnosis not present

## 2017-09-04 DIAGNOSIS — Z9049 Acquired absence of other specified parts of digestive tract: Secondary | ICD-10-CM | POA: Insufficient documentation

## 2017-09-04 DIAGNOSIS — K2970 Gastritis, unspecified, without bleeding: Secondary | ICD-10-CM | POA: Insufficient documentation

## 2017-09-04 DIAGNOSIS — R000 Tachycardia, unspecified: Secondary | ICD-10-CM | POA: Insufficient documentation

## 2017-09-04 DIAGNOSIS — G8929 Other chronic pain: Secondary | ICD-10-CM | POA: Insufficient documentation

## 2017-09-04 DIAGNOSIS — Z79899 Other long term (current) drug therapy: Secondary | ICD-10-CM | POA: Insufficient documentation

## 2017-09-04 DIAGNOSIS — M549 Dorsalgia, unspecified: Secondary | ICD-10-CM | POA: Insufficient documentation

## 2017-09-04 DIAGNOSIS — I429 Cardiomyopathy, unspecified: Secondary | ICD-10-CM | POA: Insufficient documentation

## 2017-09-04 DIAGNOSIS — Z8249 Family history of ischemic heart disease and other diseases of the circulatory system: Secondary | ICD-10-CM | POA: Diagnosis not present

## 2017-09-04 DIAGNOSIS — F419 Anxiety disorder, unspecified: Secondary | ICD-10-CM | POA: Insufficient documentation

## 2017-09-04 DIAGNOSIS — N186 End stage renal disease: Secondary | ICD-10-CM | POA: Diagnosis not present

## 2017-09-04 DIAGNOSIS — I420 Dilated cardiomyopathy: Secondary | ICD-10-CM | POA: Insufficient documentation

## 2017-09-04 DIAGNOSIS — R079 Chest pain, unspecified: Secondary | ICD-10-CM

## 2017-09-04 DIAGNOSIS — I5020 Unspecified systolic (congestive) heart failure: Secondary | ICD-10-CM | POA: Diagnosis not present

## 2017-09-04 DIAGNOSIS — Z992 Dependence on renal dialysis: Secondary | ICD-10-CM | POA: Diagnosis not present

## 2017-09-04 DIAGNOSIS — K2900 Acute gastritis without bleeding: Secondary | ICD-10-CM

## 2017-09-04 HISTORY — DX: Hyperkalemia: E87.5

## 2017-09-04 LAB — I-STAT TROPONIN, ED: Troponin i, poc: 0.02 ng/mL (ref 0.00–0.08)

## 2017-09-04 LAB — BASIC METABOLIC PANEL
ANION GAP: 15 (ref 5–15)
BUN: 66 mg/dL — ABNORMAL HIGH (ref 6–20)
CHLORIDE: 100 mmol/L — AB (ref 101–111)
CO2: 22 mmol/L (ref 22–32)
Calcium: 9.8 mg/dL (ref 8.9–10.3)
Creatinine, Ser: 7.86 mg/dL — ABNORMAL HIGH (ref 0.44–1.00)
GFR, EST AFRICAN AMERICAN: 7 mL/min — AB (ref >60)
GFR, EST NON AFRICAN AMERICAN: 6 mL/min — AB (ref >60)
Glucose, Bld: 94 mg/dL (ref 65–99)
POTASSIUM: 7.1 mmol/L — AB (ref 3.5–5.1)
SODIUM: 137 mmol/L (ref 135–145)

## 2017-09-04 LAB — CBC
HEMATOCRIT: 32.5 % — AB (ref 36.0–46.0)
HEMOGLOBIN: 10.4 g/dL — AB (ref 12.0–15.0)
MCH: 31.8 pg (ref 26.0–34.0)
MCHC: 32 g/dL (ref 30.0–36.0)
MCV: 99.4 fL (ref 78.0–100.0)
Platelets: 255 10*3/uL (ref 150–400)
RBC: 3.27 MIL/uL — AB (ref 3.87–5.11)
RDW: 16.1 % — AB (ref 11.5–15.5)
WBC: 7.9 10*3/uL (ref 4.0–10.5)

## 2017-09-04 LAB — HEPATIC FUNCTION PANEL
ALBUMIN: 3.5 g/dL (ref 3.5–5.0)
ALK PHOS: 133 U/L — AB (ref 38–126)
ALT: 40 U/L (ref 14–54)
AST: 31 U/L (ref 15–41)
Bilirubin, Direct: 0.4 mg/dL (ref 0.1–0.5)
Indirect Bilirubin: 0.7 mg/dL (ref 0.3–0.9)
TOTAL PROTEIN: 6.1 g/dL — AB (ref 6.5–8.1)
Total Bilirubin: 1.1 mg/dL (ref 0.3–1.2)

## 2017-09-04 LAB — I-STAT BETA HCG BLOOD, ED (MC, WL, AP ONLY): HCG, QUANTITATIVE: 5.9 m[IU]/mL — AB (ref <5)

## 2017-09-04 LAB — TROPONIN I
Troponin I: 0.04 ng/mL (ref <0.03)
Troponin I: 0.05 ng/mL (ref <0.03)

## 2017-09-04 LAB — LIPASE, BLOOD: LIPASE: 67 U/L — AB (ref 11–51)

## 2017-09-04 MED ORDER — OXYCODONE-ACETAMINOPHEN 5-325 MG PO TABS
ORAL_TABLET | ORAL | Status: AC
  Administered 2017-09-04: 1 via ORAL
  Filled 2017-09-04: qty 1

## 2017-09-04 MED ORDER — CALCITRIOL 0.5 MCG PO CAPS
0.7500 ug | ORAL_CAPSULE | Freq: Every day | ORAL | Status: DC
  Administered 2017-09-04 – 2017-09-06 (×3): 0.75 ug via ORAL
  Filled 2017-09-04 (×3): qty 1

## 2017-09-04 MED ORDER — ALPRAZOLAM 0.5 MG PO TABS
0.5000 mg | ORAL_TABLET | Freq: Three times a day (TID) | ORAL | Status: DC | PRN
  Administered 2017-09-04 – 2017-09-05 (×3): 0.5 mg via ORAL
  Filled 2017-09-04 (×3): qty 1

## 2017-09-04 MED ORDER — ONDANSETRON 4 MG PO TBDP
4.0000 mg | ORAL_TABLET | Freq: Once | ORAL | Status: AC
  Administered 2017-09-04: 4 mg via ORAL
  Filled 2017-09-04: qty 1

## 2017-09-04 MED ORDER — DICLOFENAC SODIUM 1 % TD GEL
2.0000 g | Freq: Four times a day (QID) | TRANSDERMAL | Status: DC
  Administered 2017-09-04: 2 g via TOPICAL
  Filled 2017-09-04: qty 100

## 2017-09-04 MED ORDER — HEPARIN SODIUM (PORCINE) 5000 UNIT/ML IJ SOLN
5000.0000 [IU] | Freq: Three times a day (TID) | INTRAMUSCULAR | Status: DC
  Filled 2017-09-04: qty 1

## 2017-09-04 MED ORDER — SEVELAMER CARBONATE 800 MG PO TABS
1600.0000 mg | ORAL_TABLET | ORAL | Status: DC | PRN
  Filled 2017-09-04: qty 2

## 2017-09-04 MED ORDER — ALBUTEROL SULFATE (2.5 MG/3ML) 0.083% IN NEBU
10.0000 mg | INHALATION_SOLUTION | Freq: Once | RESPIRATORY_TRACT | Status: AC
  Administered 2017-09-04: 10 mg via RESPIRATORY_TRACT
  Filled 2017-09-04: qty 12

## 2017-09-04 MED ORDER — ACETAMINOPHEN 325 MG PO TABS
650.0000 mg | ORAL_TABLET | Freq: Four times a day (QID) | ORAL | Status: DC | PRN
  Administered 2017-09-04 – 2017-09-05 (×2): 650 mg via ORAL
  Filled 2017-09-04 (×2): qty 2

## 2017-09-04 MED ORDER — INSULIN ASPART 100 UNIT/ML IV SOLN: 10.0000 [IU] | Freq: Once | INTRAVENOUS | Status: DC

## 2017-09-04 MED ORDER — PANTOPRAZOLE SODIUM 40 MG PO TBEC
40.0000 mg | DELAYED_RELEASE_TABLET | Freq: Every day | ORAL | Status: DC
  Administered 2017-09-04 – 2017-09-06 (×3): 40 mg via ORAL
  Filled 2017-09-04 (×3): qty 1

## 2017-09-04 MED ORDER — SODIUM CHLORIDE 0.9 % IV SOLN: 100.0000 mL | INTRAVENOUS | Status: DC | PRN

## 2017-09-04 MED ORDER — LIDOCAINE HCL (PF) 1 % IJ SOLN: 5.0000 mL | INTRAMUSCULAR | Status: DC | PRN

## 2017-09-04 MED ORDER — OXYCODONE-ACETAMINOPHEN 5-325 MG PO TABS
1.0000 | ORAL_TABLET | Freq: Four times a day (QID) | ORAL | Status: DC | PRN
  Administered 2017-09-04 – 2017-09-06 (×6): 1 via ORAL
  Filled 2017-09-04 (×4): qty 1

## 2017-09-04 MED ORDER — HEPARIN SODIUM (PORCINE) 1000 UNIT/ML DIALYSIS
1000.0000 [IU] | INTRAMUSCULAR | Status: DC | PRN
Start: 2017-09-04 — End: 2017-09-06
  Filled 2017-09-04: qty 1

## 2017-09-04 MED ORDER — LIDOCAINE-PRILOCAINE 2.5-2.5 % EX CREA
1.0000 "application " | TOPICAL_CREAM | CUTANEOUS | Status: DC | PRN
  Filled 2017-09-04: qty 5

## 2017-09-04 MED ORDER — ONDANSETRON 4 MG PO TBDP
4.0000 mg | ORAL_TABLET | Freq: Three times a day (TID) | ORAL | Status: DC | PRN
  Administered 2017-09-04 – 2017-09-05 (×2): 4 mg via ORAL
  Filled 2017-09-04 (×2): qty 1

## 2017-09-04 MED ORDER — METOPROLOL SUCCINATE ER 100 MG PO TB24
100.0000 mg | ORAL_TABLET | Freq: Every day | ORAL | Status: DC
  Administered 2017-09-04 – 2017-09-06 (×3): 100 mg via ORAL
  Filled 2017-09-04 (×4): qty 1

## 2017-09-04 MED ORDER — SEVELAMER CARBONATE 800 MG PO TABS
2400.0000 mg | ORAL_TABLET | Freq: Three times a day (TID) | ORAL | Status: DC
  Administered 2017-09-04 – 2017-09-05 (×2): 2400 mg via ORAL
  Filled 2017-09-04 (×7): qty 3

## 2017-09-04 MED ORDER — HYDROMORPHONE HCL 2 MG/ML IJ SOLN
0.5000 mg | Freq: Once | INTRAMUSCULAR | Status: AC
  Administered 2017-09-04: 0.5 mg via INTRAVENOUS
  Filled 2017-09-04: qty 1

## 2017-09-04 MED ORDER — INSULIN ASPART 100 UNIT/ML ~~LOC~~ SOLN
10.0000 [IU] | Freq: Once | SUBCUTANEOUS | Status: AC
  Administered 2017-09-04: 10 [IU] via INTRAVENOUS
  Filled 2017-09-04: qty 1

## 2017-09-04 MED ORDER — PENTAFLUOROPROP-TETRAFLUOROETH EX AERO
1.0000 "application " | INHALATION_SPRAY | CUTANEOUS | Status: DC | PRN
  Filled 2017-09-04: qty 30

## 2017-09-04 MED ORDER — CALCIUM GLUCONATE 10 % IV SOLN
1.0000 g | Freq: Once | INTRAVENOUS | Status: AC
  Administered 2017-09-04: 1 g via INTRAVENOUS
  Filled 2017-09-04: qty 10

## 2017-09-04 MED ORDER — CALCITRIOL 0.25 MCG PO CAPS
ORAL_CAPSULE | ORAL | Status: AC
  Filled 2017-09-04: qty 1

## 2017-09-04 MED ORDER — ONDANSETRON HCL 4 MG/2ML IJ SOLN
4.0000 mg | INTRAMUSCULAR | Status: AC
  Administered 2017-09-04: 4 mg via INTRAVENOUS
  Filled 2017-09-04: qty 2

## 2017-09-04 MED ORDER — SODIUM BICARBONATE 8.4 % IV SOLN
50.0000 meq | Freq: Once | INTRAVENOUS | Status: AC
  Administered 2017-09-04: 50 meq via INTRAVENOUS
  Filled 2017-09-04: qty 50

## 2017-09-04 MED ORDER — GI COCKTAIL ~~LOC~~
30.0000 mL | Freq: Once | ORAL | Status: AC
  Administered 2017-09-04: 30 mL via ORAL
  Filled 2017-09-04: qty 30

## 2017-09-04 MED ORDER — HYDRALAZINE HCL 25 MG PO TABS
25.0000 mg | ORAL_TABLET | Freq: Three times a day (TID) | ORAL | Status: DC
  Administered 2017-09-04 – 2017-09-06 (×6): 25 mg via ORAL
  Filled 2017-09-04 (×6): qty 1

## 2017-09-04 MED ORDER — OXYCODONE-ACETAMINOPHEN 5-325 MG PO TABS
1.0000 | ORAL_TABLET | ORAL | Status: DC | PRN
  Administered 2017-09-04: 1 via ORAL
  Filled 2017-09-04: qty 1

## 2017-09-04 MED ORDER — CALCITRIOL 0.5 MCG PO CAPS
ORAL_CAPSULE | ORAL | Status: AC
  Filled 2017-09-04: qty 1

## 2017-09-04 MED ORDER — DEXTROSE 50 % IV SOLN
1.0000 | Freq: Once | INTRAVENOUS | Status: AC
  Administered 2017-09-04: 50 mL via INTRAVENOUS
  Filled 2017-09-04: qty 50

## 2017-09-04 NOTE — ED Triage Notes (Signed)
Reports abdominal pain for two days and left sided chest pain that started tonight.  Radiates into left neck and left arm.  Also c/o migraine headache.  For dialysis today.

## 2017-09-04 NOTE — ED Notes (Signed)
Patient given ginger ale per admitting physician.

## 2017-09-04 NOTE — Progress Notes (Signed)
Troponin Level 0.04. Dr Johny Chess notified, no new order given. Pt is resting comfortably with no c/o

## 2017-09-04 NOTE — ED Provider Notes (Signed)
[facility] EMERGENCY DEPARTMENT Provider Note   CSN: [id number] Arrival date & time: 09/04/17  9798     History   Chief Complaint Chief Complaint  Patient presents with  . Chest Pain    HPI Valerie Yates is a 42 y.o. female.  With a past medical history of end-stage renal disease on hemodialysis Monday/ Wednesday/ Friday, poorly controlled hypertension, congestive dilated cardiomyopathy, migraine, and past family history of early myocardial infarction who presents the emergency department with chief complaint of chest pain and abdominal pain.  Patient states she had onset of epigastric abdominal pain and left-sided chest pain around 11 PM last night.  She complains that the pain radiates into the left side of her neck.  She began feeling somewhat short of breath throughout the night and had several episodes of bilious vomitus.  She states that she took several Tums without relief of her symptoms.  She is supposed to be at dialysis this morning at 5:30 AM.  She is status post cholecystectomy.  She denies contacts with similar symptoms.  She denies fever or chills.  HPI  Past Medical History:  Diagnosis Date  . Anxiety   . Complication of anesthesia    woke up before they took the endotra. tube and tries to take out per patient  . ESRD (end stage renal disease) on dialysis ([facility])    Mon, Wed, Fri. at [facility] on [address]  . GERD (gastroesophageal reflux disease)   . Headache    "after dialysis" reports that only when excessive fluid is removed at dialysis  . Heart murmur    reports that they cannot decide if it is murmur vs. flow from fistula in left arm  . History of kidney infection   . Hyperparathyroidism due to renal insufficiency ([place])   . Hypertension   . Iron deficiency anemia   . Migraine    "a few/year": (09/14/2015)  . Shortness of breath dyspnea    resolved per patient was due to fluid overload  . UTI (lower urinary tract infection)    recurrent     Patient Active Problem List   Diagnosis Date Noted  . Preop cardiovascular exam 04/11/2017  . Mitral valvular regurgitation 07/07/2016  . Congestive dilated cardiomyopathy ([place]): lilkely HTN related 07/07/2016  . Abnormal echocardiogram findings without diagnosis 02/25/2016  . Malnutrition of moderate degree 11/17/2015  . Nausea with vomiting 11/16/2015  . Poorly-controlled hypertension     Class: Stage 1  . ESRD on dialysis [facility])     Past Surgical History:  Procedure Laterality Date  . ABDOMINAL HYSTERECTOMY  2006  . AV FISTULA PLACEMENT Left 01/27/2015   Procedure:  BASILIC VEIN TRANSPOSITION ;  Surgeon: Angelia Mould, MD;  Location: [facility];  Service: Vascular;  Laterality: Left;  . DILATION AND CURETTAGE OF UTERUS    . LAPAROSCOPIC CHOLECYSTECTOMY  2005  . NM MYOVIEW LTD  08/2014   No fixed or reversible perfusion defect - to suggest Infarct or Ischeima.. EF ~70%.    Marland Kitchen SHOULDER ARTHROSCOPY W/ ROTATOR CUFF REPAIR Left 2012  . SHOULDER ARTHROSCOPY WITH LABRAL REPAIR Right 08/11/2016   Procedure: RIGHT SHOULDER ARTHROSCOPY WITH LABRAL REPAIR;  Surgeon: Justice Britain, MD;  Location: [facility];  Service: Orthopedics;  Laterality: Right;  . TRANSTHORACIC ECHOCARDIOGRAM  04/2014   Low normal EF of 50-55%. No RWMA.  Tr MR/TR.  NORMAL  . TRANSTHORACIC ECHOCARDIOGRAM  10/27/2015   [facility] (in setting of volume overload - leading to HD): Mildly  impaired LV function with EF of 45-50%. Pseudo-normal LV filling (GR 2 DD. Moderate MR and moderate TR. RVSP estimated at 52 mmHg.  Marland Kitchen TRANSTHORACIC ECHOCARDIOGRAM  03/2017   Mild LVH.  Mildly reduced EF of 45-50%.  GRII DD.  Mild MR.  Moderate LA dilation.  Mild TR (improved from moderate MR and TR on last echo, suggestive of functional regurgitation due to LV dilation from volume overload  . TUBAL LIGATION  2003     OB History   None      Home Medications    Prior to Admission medications   Medication Sig Start Date End Date  Taking? Authorizing Provider  ALPRAZolam Duanne Moron) 0.5 MG tablet Take 0.5 mg by mouth 3 (three) times daily.     [provider]  furosemide (LASIX) 20 MG tablet Take 1 tablet (20 mg total) by mouth daily. 08/24/17 11/22/17  Lendon Colonel, NP  hydrALAZINE (APRESOLINE) 25 MG tablet Take 1 tablet (25 mg total) by mouth 3 (three) times daily. 08/24/17 11/22/17  Lendon Colonel, NP  lidocaine-prilocaine (EMLA) cream Apply 1 application topically as needed. Apply prior to dialysis.    [provider]  lisinopril (PRINIVIL,ZESTRIL) 40 MG tablet Take 40 mg by mouth at bedtime.  06/22/16   [provider]  metoprolol succinate (TOPROL-XL) 100 MG 24 hr tablet Take 100 mg by mouth daily. Take with or immediately following a meal.    [provider]  ondansetron (ZOFRAN) 4 MG tablet Take 1-2 tablets (4-8 mg total) by mouth every 8 (eight) hours as needed for nausea or vomiting. 12/08/16   Armbruster, Carlota Raspberry, MD  oxyCODONE-acetaminophen (PERCOCET) 5-325 MG tablet Take 1-2 tablets by mouth every 4 (four) hours as needed. 08/11/16   Shuford, Olivia Mackie, PA-C  RENVELA 800 MG tablet Take 1,600 mg by mouth 3 (three) times daily before meals. 09/07/15   [provider]    Family History Family History  Problem Relation Age of Onset  . Diabetes Maternal Grandmother   . Cancer Mother        Breast  . Breast cancer Mother   . Heart disease Maternal Grandfather     Social History Social History   Tobacco Use  . Smoking status: Never Smoker  . Smokeless tobacco: Never Used  Substance Use Topics  . Alcohol use: No    Alcohol/week: 0.0 oz  . Drug use: No     Allergies   Azithromycin; Erythromycin; Penicillins; Amlodipine; Valsartan; and Morphine sulfate   Review of Systems Review of Systems  Ten systems reviewed and are negative for acute change, except as noted in the HPI.   Physical Exam Updated Vital Signs BP (!) 185/121 (BP Location: Right Arm)   Pulse  (!) 105   Temp 98.5 F (36.9 C) (Oral)   Resp 16   Ht 5\' 7"  (1.702 m)   Wt 54.4 kg (120 lb)   SpO2 98%   BMI 18.79 kg/m   Physical Exam  Constitutional: She is oriented to person, place, and time. She appears well-developed and well-nourished. No distress.  Appears uncomfortable  HENT:  Head: Normocephalic and atraumatic.  Eyes: Pupils are equal, round, and reactive to light. Conjunctivae and EOM are normal. No scleral icterus.  Neck: Normal range of motion.  Cardiovascular: Normal rate, regular rhythm and normal heart sounds. Exam reveals no gallop and no friction rub.  No murmur heard. Pulmonary/Chest: Effort normal and breath sounds normal. No respiratory distress.  Abdominal: Soft. Bowel sounds are  normal. She exhibits no distension and no mass. There is tenderness (epigastrium). There is no guarding.  Neurological: She is alert and oriented to person, place, and time.  Skin: Skin is warm and dry. She is not diaphoretic.  Psychiatric: Her behavior is normal.  Nursing note and vitals reviewed.    ED Treatments / Results  Labs (all labs ordered are listed, but only abnormal results are displayed) Labs Reviewed  BASIC METABOLIC PANEL - Abnormal; Notable for the following components:      Result Value   Potassium 7.1 (*)    Chloride 100 (*)    BUN 66 (*)    Creatinine, Ser 7.86 (*)    GFR calc non Af Amer 6 (*)    GFR calc Af Amer 7 (*)    All other components within normal limits  CBC - Abnormal; Notable for the following components:   RBC 3.27 (*)    Hemoglobin 10.4 (*)    HCT 32.5 (*)    RDW 16.1 (*)    All other components within normal limits  I-STAT BETA HCG BLOOD, ED ([facility], [facility], AP ONLY) - Abnormal; Notable for the following components:   I-stat hCG, quantitative 5.9 (*)    All other components within normal limits  I-STAT TROPONIN, ED    EKG None  Radiology Dg Chest 2 View  Result Date: 09/04/2017 CLINICAL DATA:  Abdominal pain for 2 days. Left-sided  chest pain starting tonight. Pain radiates to left arm and left neck. EXAM: CHEST - 2 VIEW COMPARISON:  05/15/2016 FINDINGS: Cardiac enlargement. Pulmonary vascular congestion. No edema or consolidation. No blunting of costophrenic angles. No pneumothorax. Mediastinal contours appear intact. Surgical clips in the right upper quadrant. IMPRESSION: Cardiac enlargement with pulmonary vascular congestion. No edema or consolidation. Electronically Signed   By: Lucienne Capers M.D.   On: 09/04/2017 04:21    Procedures Procedures (including critical care time)  Medications Ordered in ED Medications  oxyCODONE-acetaminophen (PERCOCET/ROXICET) 5-325 MG per tablet 1 tablet (1 tablet Oral Given 09/04/17 0357)  calcium gluconate inj 10% (1 g) URGENT USE ONLY! (has no administration in time range)  insulin aspart (novoLOG) injection 10 Units (has no administration in time range)  dextrose 50 % solution 50 mL (has no administration in time range)  sodium bicarbonate injection 50 mEq (has no administration in time range)  ondansetron (ZOFRAN-ODT) disintegrating tablet 4 mg (4 mg Oral Given 09/04/17 0357)  albuterol (PROVENTIL) (2.5 MG/3ML) 0.083% nebulizer solution 10 mg (10 mg Nebulization Given 09/04/17 0645)   CRITICAL CARE Performed by: Margarita Mail Total critical care time: 50 minutes Critical care time was exclusive of separately billable procedures and treating other patients. Critical care was necessary to treat or prevent imminent or life-threatening deterioration. Critical care was time spent personally by me on the following activities: development of treatment plan with patient and/or surrogate as well as nursing, discussions with consultants, evaluation of patient's response to treatment, examination of patient, obtaining history from patient or surrogate, ordering and performing treatments and interventions, ordering and review of laboratory studies, ordering and review of radiographic studies,  pulse oximetry and re-evaluation of patient's condition.   Initial Impression / Assessment and Plan / ED Course  I have reviewed the triage vital signs and the nursing notes.  Pertinent labs & imaging results that were available during my care of the patient were reviewed by me and considered in my medical decision making (see chart for details).  Clinical Course as of Sep 04 808  Mon Sep 04, 2017  0704 EMR review shows negative stress test December 2018.    [AH]  0705 Low suspicion for pregnancy  I-stat hCG, quantitative(!): 5.9 [AH]  0705 Potassium markedly elevated- no hemolysis present. I have initiated emergent temporizing agents.  Potassium(!!): 7.1 [AH]  0706 Hgb at baseline  Hemoglobin(!): 10.4 [AH]  0706 EKG reviewed and no significant changes    [AH]  0707 Mild pulmonary vascular congestion.   [AH]  2174 Awaiting nephrology call back   BP(!): 185/121 [AH]  0801 I spoke with Dr. Jonnie Finner who will take the patient for emergent dialysis.   [AH]    Clinical Course User Index [AH] Margarita Mail, PA-C    Patient admitted to internal medicine service. Still c/o pain. Given 0.5 dilaudid and 4 zofran.  Patient will be dialyzed this morning.  Final Clinical Impressions(s) / ED Diagnoses   Final diagnoses:  None    ED Discharge Orders    None       Margarita Mail, PA-C 09/04/17 7159    Veryl Speak, MD 09/16/17 2302

## 2017-09-04 NOTE — Procedures (Signed)
Pt stable on HD, low K bath.    I was present at this dialysis session, have reviewed the session itself and made  appropriate changes Kelly Splinter MD [place] pager [phone]   09/04/2017, 3:28 PM

## 2017-09-04 NOTE — Progress Notes (Signed)
Patient has pain that is unrelieved by Percocet. No other PRN orders. MD notified and made aware. Orders for Tylenol to be placed. Patient updated. Will continue to monitor.

## 2017-09-04 NOTE — H&P (Addendum)
Date: 09/04/2017               Patient Name:  Valerie Yates MRN: [id number]  DOB: June 09, 1975 Age / Sex: 42 y.o., female   PCP: Enid Skeens., MD         Medical Service: Internal Medicine Teaching Service         Attending Physician: Dr. Veryl Speak, MD    First Contact: Dr. Johny Chess Pager: [phone]  Second Contact: Dr. Heber Diamond Pager: [phone]       After Hours (After 5p/  First Contact Pager: [phone]  weekends / holidays): Second Contact Pager: [phone]   Chief Complaint: CP, epigastric abdominal pain  History of Present Illness: Patient is a 42 year old with past medical history of hypertension, GERD, end-stage renal disease on hemodialysis, anxiety presenting to the hospital with a chief complaint of chest pain and epigastric abdominal pain.  Patient states she was watching TV last night and experienced sudden onset left-sided chest pain and epigastric abdominal pain at that time.  The chest pain was 7 out of 10 intensity, radiated to the left side of her neck, and associated with diaphoresis.  It has been constant since then.  States she checked her blood pressure at that time and it was 187/127 and her pulse was in the 120s.  Epigastric pain was 10 out of 10 in intensity and she took Tums which did not make it better.  She has had 3-4 episodes of bilious emesis since then.  States she had her gallbladder removed in 2005.  States she has only been able to tolerate Gatorade since last night.  Also reports having chronic diarrhea 2-3 times a day which she attributes to taking a phosphorus supplement for her end-stage renal disease.  Denies laxative use.  Denies having any melena or hematochezia.  She goes for dialysis every Monday, Wednesday, and Friday.  Reports compliance and states her last dialysis session was on Friday.  She is due for dialysis today.  He still makes urine.  States she has been on dialysis for the past 3 years and was told that her kidney disease was related to  uncontrolled hypertension.  Patient is requesting something to drink.  No other complaints.   Meds:  No outpatient medications have been marked as taking for the 09/04/17 encounter [facility]).     Allergies: Allergies as of 09/04/2017 - Review Complete 09/04/2017  Allergen Reaction Noted  . Azithromycin Anaphylaxis and Rash 04/08/2015  . Erythromycin Hives, Shortness Of Breath, and Rash 03/26/2014  . Penicillins Anaphylaxis, Hives, and Shortness Of Breath 02/09/2014  . Amlodipine  08/16/2017  . Valsartan  08/16/2017  . Morphine sulfate Rash 03/26/2014   Past Medical History:  Diagnosis Date  . Anxiety   . Complication of anesthesia    woke up before they took the endotra. tube and tries to take out per patient  . ESRD (end stage renal disease) on dialysis ([place])    Mon, Wed, Fri. at [facility] on [address]  . GERD (gastroesophageal reflux disease)   . Headache    "after dialysis" reports that only when excessive fluid is removed at dialysis  . Heart murmur    reports that they cannot decide if it is murmur vs. flow from fistula in left arm  . History of kidney infection   . Hyperparathyroidism due to renal insufficiency ([place])   . Hypertension   . Iron deficiency anemia   . Migraine    "a few/year": (09/14/2015)  .  Shortness of breath dyspnea    resolved per patient was due to fluid overload  . UTI (lower urinary tract infection)    recurrent    Family History: Father had a heart attack in his 72s.  Paternal uncle had a heart attack in his 17s.  Social History: Denies tobacco, ethanol, or drug use.  Review of Systems: A complete ROS was negative except as per HPI.  Physical Exam: Blood pressure (!) 165/109, pulse (!) 128, temperature 98.5 F (36.9 C), temperature source Oral, resp. rate (!) 27, height 5\' 7"  (1.702 m), weight 120 lb (54.4 kg), SpO2 96 %. Physical Exam  Constitutional: She is oriented to person, place, and time. She appears well-developed and  well-nourished.  Resting comfortably in a hospital stretcher watching television  HENT:  Mouth/Throat: Oropharynx is clear and moist.  Eyes: Right eye exhibits no discharge. Left eye exhibits no discharge.  Cardiovascular: Normal rate, regular rhythm and intact distal pulses. Exam reveals no gallop and no friction rub.  Murmur heard. Pulmonary/Chest: Effort normal and breath sounds normal. No respiratory distress. She has no wheezes. She has no rales.  Abdominal: Soft. Bowel sounds are normal. She exhibits no distension. There is tenderness. There is no rebound.  Epigastrium tender to palpation  Musculoskeletal: She exhibits no edema.  Neurological: She is alert and oriented to person, place, and time.  Skin: Skin is warm and dry.    EKG: personally reviewed my interpretation is sinus tachycardia.  No peaked T waves, flattened P waves, or QRS widening.  CXR: personally reviewed my interpretation is cardiac enlargement with pulmonary vascular congestion.  No edema or consolidation.  Assessment & Plan by Problem: Active Problems:   * No active hospital problems. *  42 year old with past medical history of hypertension, GERD, end-stage renal disease on hemodialysis, anxiety presenting to the hospital with a chief complaint of chest pain and epigastric abdominal pain.  Chest pain: Upon arrival to the hospital, tachycardic and hypertensive.  Not hypoxic.  I-STAT troponin negative.  EKG without acute ischemic changes.  Heart score 2 (low risk).  Stress test done in December 2018 was low risk.   -Monitor on telemetry -Continue to trend troponin  Hyperkalemia in the setting of end-stage renal disease on hemodialysis: Reports compliance with dialysis but has been drinking Gatorade since last night due to nausea/vomiting.  Potassium 7.1 on admission.  EKG without acute changes.  She received insulin, dextrose, calcium gluconate, albuterol, and bicarbonate in the ED.  ED provider has already spoken  to nephrology (Dr. Jonnie Finner). -Appreciate nephrology recommendations -Continue home Renvela -Continue home calcitriol -Continue to monitor electrolytes  Epigastric abdominal pain: A/w bilious emesis. She is status post laparoscopic cholecystectomy in 2005. Last GI visit in January 2018 was for evaluation of nausea/vomiting which was thought to be related to chronic opioid use.  EGD done in February 2018 with evidence of gastritis.  -Continue home Percocet 5-325 mg: 1 tablet every 6 hours as needed  -Continue home Zofran: 4 mg every 8 hours as needed for nausea -Start Protonix 40 mg daily -Check hepatic function panel -Check lipase  Hypertensive urgency: Blood pressure 185/121 on arrival.  She has a history of uncontrolled hypertension. Followed by cardiology on an outpatient basis. -Continue home hydralazine 25 mg 3 times a day -Continue home metoprolol succinate 100 mg daily -Hold home lisinopril -Hold home lasix   Nonischemic cardiomyopathy: LVEF 45 to 50% and grade 2 diastolic dysfunction on echo done in November 2018.  Currently not volume  overloaded on exam. -Continue home metoprolol succinate 100 mg daily -Hold home Lasix  End-stage renal disease on hemodialysis (MWF): Needs emergent dialysis in the setting of hyperkalemia. Nephrology has already been consulted. -Appreciate nephrology recommendations -Continue home Renvela -Continue home calcitriol -Continue to monitor electrolytes  Anxiety -Continue home Xanax: 0.5 mg 3 times a day as needed   Chronic back pain -Continue home Percocet 5-325 mg: 1 tablet every 6 hours as needed  DVT prophylaxis: Subcutaneous heparin  Diet: Renal  CODE STATUS: Full code  Dispo: Admit patient to Observation with expected length of stay less than 2 midnights.  Signed: Shela Leff, MD 09/04/2017, 8:34 AM  Pager: [phone]

## 2017-09-04 NOTE — ED Notes (Signed)
Hospitalist at bedside 

## 2017-09-04 NOTE — ED Notes (Signed)
Gave pt Ginger Ale and graham crackers, per Abigail Butts - RN.

## 2017-09-04 NOTE — Progress Notes (Addendum)
[facility] Brief Note  Valerie Yates is 42 year old female with ESRD on HD, (MWF [facility]), resistant HTN, dilated CM, s/p cholecystectomy. HD started 04/2015, active on transplant list at [facility].   She presented to [facility] ED with chest and abdominal pain. Found to have K+ 7.1 requiring urgent dialysis. She is admitted under observation status for further evaluation. EKG without acute changes. CXR without edema.  Ca gluconate, albuterol, insulin, sodium bicarb dosed in ED.   Seen in HD unit. CP has resolved, but reported continued epigastric pain. Reports nausea/vomiting before admit. Now tolerating graham crackers.   Unclear why K is elevated. Has not missed any dialysis and usually good about diet. Dialyzes via LA AVF. Adequacy targets met. She was recently referred for outpatient fistulogram d/t high venous pressures and an enlarging aneurysm.   Lungs clear on exam, No LE edema LUE AVF cannulated on HD BFR 400  Dialysis Orders: [facility] MWF 3.25h 180NRe 350/600 2K/2Ca Profile 2  53.5kg  No heparin Mircera 50mcg IV q 2weeks (to start 5/1)   Plan: For urgent HD today to correct K+ Full consult if changes to inpatient status   Ogechi Grace Ejigiri PA-C [facility] Pager [phone] 09/04/2017,12:55 PM  Pt seen, examined and agree w A/P as above.  Rob Schertz MD [facility] pager [phone]   09/04/2017, 3:30 PM    

## 2017-09-05 ENCOUNTER — Encounter (HOSPITAL_COMMUNITY): Payer: Self-pay | Admitting: General Practice

## 2017-09-05 ENCOUNTER — Other Ambulatory Visit: Payer: Self-pay

## 2017-09-05 DIAGNOSIS — N186 End stage renal disease: Secondary | ICD-10-CM

## 2017-09-05 DIAGNOSIS — Z992 Dependence on renal dialysis: Secondary | ICD-10-CM

## 2017-09-05 DIAGNOSIS — R112 Nausea with vomiting, unspecified: Secondary | ICD-10-CM | POA: Diagnosis not present

## 2017-09-05 DIAGNOSIS — I160 Hypertensive urgency: Secondary | ICD-10-CM | POA: Diagnosis not present

## 2017-09-05 DIAGNOSIS — R1013 Epigastric pain: Secondary | ICD-10-CM

## 2017-09-05 DIAGNOSIS — Z9049 Acquired absence of other specified parts of digestive tract: Secondary | ICD-10-CM

## 2017-09-05 DIAGNOSIS — F419 Anxiety disorder, unspecified: Secondary | ICD-10-CM | POA: Diagnosis not present

## 2017-09-05 DIAGNOSIS — I120 Hypertensive chronic kidney disease with stage 5 chronic kidney disease or end stage renal disease: Secondary | ICD-10-CM | POA: Diagnosis not present

## 2017-09-05 DIAGNOSIS — K219 Gastro-esophageal reflux disease without esophagitis: Secondary | ICD-10-CM

## 2017-09-05 DIAGNOSIS — E875 Hyperkalemia: Secondary | ICD-10-CM | POA: Diagnosis not present

## 2017-09-05 DIAGNOSIS — Z79899 Other long term (current) drug therapy: Secondary | ICD-10-CM

## 2017-09-05 DIAGNOSIS — R0789 Other chest pain: Secondary | ICD-10-CM

## 2017-09-05 HISTORY — DX: Hyperkalemia: E87.5

## 2017-09-05 LAB — RENAL FUNCTION PANEL
ALBUMIN: 3.3 g/dL — AB (ref 3.5–5.0)
ANION GAP: 12 (ref 5–15)
Albumin: 3.2 g/dL — ABNORMAL LOW (ref 3.5–5.0)
Anion gap: 13 (ref 5–15)
BUN: 24 mg/dL — AB (ref 6–20)
BUN: 33 mg/dL — ABNORMAL HIGH (ref 6–20)
CALCIUM: 8.1 mg/dL — AB (ref 8.9–10.3)
CALCIUM: 8.5 mg/dL — AB (ref 8.9–10.3)
CO2: 31 mmol/L (ref 22–32)
CO2: 28 mmol/L (ref 22–32)
CREATININE: 4.71 mg/dL — AB (ref 0.44–1.00)
Chloride: 94 mmol/L — ABNORMAL LOW (ref 101–111)
Chloride: 96 mmol/L — ABNORMAL LOW (ref 101–111)
Creatinine, Ser: 5.91 mg/dL — ABNORMAL HIGH (ref 0.44–1.00)
GFR calc Af Amer: 9 mL/min — ABNORMAL LOW (ref >60)
GFR calc non Af Amer: 11 mL/min — ABNORMAL LOW (ref >60)
GFR, EST AFRICAN AMERICAN: 12 mL/min — AB (ref >60)
GFR, EST NON AFRICAN AMERICAN: 8 mL/min — AB (ref >60)
Glucose, Bld: 92 mg/dL (ref 65–99)
Glucose, Bld: 89 mg/dL (ref 65–99)
PHOSPHORUS: 3.3 mg/dL (ref 2.5–4.6)
POTASSIUM: 5.6 mmol/L — AB (ref 3.5–5.1)
Phosphorus: 3 mg/dL (ref 2.5–4.6)
Potassium: 5.8 mmol/L — ABNORMAL HIGH (ref 3.5–5.1)
SODIUM: 138 mmol/L (ref 135–145)
SODIUM: 136 mmol/L (ref 135–145)

## 2017-09-05 LAB — TROPONIN I: Troponin I: 0.06 ng/mL (ref <0.03)

## 2017-09-05 LAB — HEPATITIS B CORE ANTIBODY, TOTAL: HEP B C TOTAL AB: NEGATIVE

## 2017-09-05 LAB — MRSA PCR SCREENING: MRSA by PCR: NEGATIVE

## 2017-09-05 LAB — HEPATITIS B SURFACE ANTIBODY,QUALITATIVE: Hep B S Ab: REACTIVE

## 2017-09-05 LAB — HEPATITIS B SURFACE ANTIGEN: Hepatitis B Surface Ag: NEGATIVE

## 2017-09-05 LAB — HIV ANTIBODY (ROUTINE TESTING W REFLEX): HIV SCREEN 4TH GENERATION: NONREACTIVE

## 2017-09-05 MED ORDER — FUROSEMIDE 20 MG PO TABS
20.0000 mg | ORAL_TABLET | Freq: Every day | ORAL | Status: DC
  Administered 2017-09-05 – 2017-09-06 (×2): 20 mg via ORAL
  Filled 2017-09-05 (×2): qty 1

## 2017-09-05 MED ORDER — OXYCODONE HCL 5 MG PO TABS
5.0000 mg | ORAL_TABLET | Freq: Once | ORAL | Status: AC | PRN
  Administered 2017-09-05: 5 mg via ORAL
  Filled 2017-09-05: qty 1

## 2017-09-05 MED ORDER — ONDANSETRON HCL 4 MG/2ML IJ SOLN
4.0000 mg | Freq: Once | INTRAMUSCULAR | Status: AC
  Administered 2017-09-05: 4 mg via INTRAVENOUS
  Filled 2017-09-05: qty 2

## 2017-09-05 NOTE — Discharge Summary (Signed)
Name: Valerie Yates MRN: [id number] DOB: 1976-03-20 42 y.o. PCP: Enid Skeens., MD  Date of Admission: 09/04/2017  3:34 AM Date of Discharge: 5/1/20195/05/2017 Attending Physician: Oval Linsey  Discharge Diagnosis: 1. Hyperkalemia 2. Abdominal pain, Nausea, vomiting  Discharge Medications: Allergies as of 09/06/2017      Reactions   Azithromycin Anaphylaxis, Rash   Erythromycin Hives, Shortness Of Breath, Rash   Acne Aid-Dermatological. Acne Aid-Dermatological.   Penicillins Anaphylaxis, Hives, Shortness Of Breath   Has patient had a PCN reaction causing immediate rash, facial/tongue/throat swelling, SOB or lightheadedness with hypotension: #  #  #  YES  #  #  #  Has patient had a PCN reaction causing SEVERE RASH INVOLVING MUCUS MEMBRANES or SKIN NECROSIS: #  #  #  YES  #  #  #  Has patient had a PCN reaction that required hospitalization #  #  #  YES  #  #  #  Has patient had a PCN reaction occurring within the last 10 years:  #  #  #  NO  #  #  #  Has patient had a PCN reaction causing immediate rash, facial/tongue/throat swelling, SOB or lightheadedness with hypotension: ###YES###  Has patient had a PCN reaction causing SEVERE RASH INVOLVING MUCUS MEMBRANES or SKIN NECROSIS: ###YES###  Has patient had a PCN reaction that required hospitalization ###YES###  Has patient had a PCN reaction occurring within the last 10 years:###NO###    Amlodipine    Valsartan    Morphine Sulfate Rash   Tolerates with Benadryl  Tolerates with Benadryl       Medication List    STOP taking these medications   lidocaine-prilocaine cream Commonly known as:  EMLA   lisinopril 40 MG tablet Commonly known as:  PRINIVIL,ZESTRIL     TAKE these medications   ALPRAZolam 0.5 MG tablet Commonly known as:  XANAX Take 0.5 mg by mouth 3 (three) times daily. Notes to patient:  Last given 09/05/17 10:13 pm   calcitRIOL 0.25 MCG capsule Commonly known  as:  ROCALTROL Take 0.75 mcg by mouth daily.   furosemide 20 MG tablet Commonly known as:  LASIX Take 1 tablet (20 mg total) by mouth daily.   hydrALAZINE 25 MG tablet Commonly known as:  APRESOLINE Take 1 tablet (25 mg total) by mouth 3 (three) times daily.   metoprolol succinate 100 MG 24 hr tablet Commonly known as:  TOPROL-XL Take 100 mg by mouth daily. Take with or immediately following a meal.   ondansetron 4 MG tablet Commonly known as:  ZOFRAN Take 1-2 tablets (4-8 mg total) by mouth every 8 (eight) hours as needed for nausea or vomiting.   oxyCODONE-acetaminophen 5-325 MG tablet Commonly known as:  PERCOCET Take 1-2 tablets by mouth every 4 (four) hours as needed. Notes to patient:  Lat given 09/06/17 12:46pm   pantoprazole 40 MG tablet Commonly known as:  PROTONIX Take 1 tablet (40 mg total) by mouth daily.   RENVELA 800 MG tablet Generic drug:  sevelamer carbonate Take 1,600-2,400 mg by mouth 3 (three) times daily before meals. Takes 1600 mg with snacks. 2400 mg with meals       Disposition and follow-up:   Ms.Valerie Yates was discharged from [facility] in Stable condition.  At the hospital follow up visit please address:  1.  Hyperkalemia  - Continue to encourage good adherence with HD sessions. K to be monitored at dialysis - Lisinopril held on discharge in  case it was contributing to hyperkalemia - Continue to encourage avoidance of high potassium foods (provided list on discharge)  2.  Gastritis - Continue to encourage daily PPI for 6 weeks - Evaluate for improvement in abdominal pain, vomiting - Can consider future further work-up for chronic pancreatitis vs somatization disorder vs gastroparesis if no improvement  3.  ESRD on HD MWF - Keep appointment for scheduled fistula procedure to improve access (5/2)   4.  Labs / imaging needed at time of follow-up: K  5.  Pending labs/ test needing follow-up: None  Follow-up  Appointments:   Hospital Course by problem list:   ESRD on MWF HD, Hyperkalemia Found to have potassium of 7.1 without EKG changes on admission. She received temporizing measures with calcium gluconate, albuterol, insulin/D50 before receiving urgent HD. Her potassium improved but remained elevated at 5.8 the following day. However, the pt felt her fistula would not tolerate consecutive dialysis days--she reported a narrowing for which she was scheduled to have an outpatient fistulogram and intervention while inpatient. This procedure was re-scheduled for 5/2, and she received an additional HD session on her normal schedule with improvement in her K. She did not have any missed or shortened HD sessions prior to arrival. The etiology of her hyperkalemia is unclear but thought to be related to increased PO potassium intake and possibly lisinopril. Hemolysis was considered, however indirect bili was normal and hemoglobin was stable. Lisinopril held on discharge and patient instructed on avoidance of high potassium foods (provided with a list of high K foods on discharge).  Gastritis Epigastric pain, N/V She presented with complaints of post-prandial epigastric pain with associated nausea and vomiting. Labs only revealed mild alk phos and lipase elevation which were non-diagnostic and did not reveal cause for her sx. Patient has had previous evaluation for her epigastric pain, which included an EGD, colonoscopy, and CT imaging. EGD in 06/2016 showed gastritis. She was taking prilosec only as needed prior to admission. Colonoscopy at that time was unremarkable. She was taking lisinopril, which is very rarely associated with pancreatitis (<1%), however CT A/P in 2017 was unremarkable, notably no pancreatic calcifications. CT did show retained food products in stomach, however patient denies early satiety and has no hx of diabetes, making gastroparesis less likely although still a consideration in the differential.  Also less likely is mesenteric ischemia given that her weight has been stable over the last 2 years. The final consideration would be somatization disorder given her history of anxiety however this would be a diagnosis of exclusion. Most likely etiology at this time was felt to be recurrent gastritis in the setting of inconsistent PPI use. She was instructed to take daily PPI for 6 weeks and outpatient follow-up for need for further work-up.  Chest Pain She also complained of atypical chest pain with the above abdominal pain. She had a stress test done in 04/2017 that was felt to be low risk, troponin mildly elevated (0.06) in the setting of ESRD. This was unlikely to represent ACS and her chest pain quickly resolved.  H/o HTN BP elevated on presentation up to 017P systolic. Blood pressure improved with resumption of home hydralazine and metoprolol, as well as with dialysis. Lisinopril was held during admission and on discharge due to potential to cause hyperkalemia.  H/o HFrEF Last echo 03/2017 with EF 45-50% with grade 2 diastolic dysfunction, volume managed primarily by HD, continued on home beta blocker and hypertensives as above.   Chronic Back Pain Home percocet  5-325 mg continued on admission.  Discharge Vitals:   BP (!) 157/100 (BP Location: Right Arm)   Pulse 80   Temp 98.3 F (36.8 C) (Oral)   Resp 18   Ht _0  (1.702 m)   Wt 117 lb 8.1 oz (53.3 kg)   SpO2 99%   BMI 18.40 kg/m   Pertinent Labs, Studies, and Procedures:  CBC Latest Ref Rng & Units 09/04/2017 08/11/2016 05/15/2016  WBC 4.0 - 10.5 K/uL 7.9 - 6.0  Hemoglobin 12.0 - 15.0 g/dL 10.4(L) 10.2(L) 9.9(L)  Hematocrit 36.0 - 46.0 % 32.5(L) 30.0(L) 29.7(L)  Platelets 150 - 400 K/uL 255 - 168   BMP Latest Ref Rng & Units 09/06/2017 09/05/2017 09/05/2017  Glucose 65 - 99 mg/dL 91 89 92  BUN 6 - 20 mg/dL 35(H) 33(H) 24(H)  Creatinine 0.44 - 1.00 mg/dL 6.51(H) 5.91(H) 4.71(H)  Sodium 135 - 145 mmol/L 138 136 138  Potassium 3.5  - 5.1 mmol/L 5.9(H) 5.6(H) 5.8(H)  Chloride 101 - 111 mmol/L 97(L) 96(L) 94(L)  CO2 22 - 32 mmol/L _1 Calcium 8.9 - 10.3 mg/dL 8.7(L) 8.5(L) 8.1(L)   Hep B SAb reactive, Hep B SAg negative, Hep B CAb negative HIV nonreactive Troponin 0.04->0.05->0.06 Lipase 67  CXR 09/04/2017 Cardiac enlargement with pulmonary vascular congestion. No edema or consolidation.  Discharge Instructions: Discharge Instructions    Diet - low sodium heart healthy   Complete by:  As directed    Discharge instructions   Complete by:  As directed    Ms. Prabhu,  It was a pleasure to take care of you while you were in the hospital. While you were here, we treated you for stomach pain that we think is related to gastritis (inflammation of the lining of the stomach). The best treatment for this is an acid suppressing medicine.  - Please take pantoprazole (Prilosec) 18m EVERY DAY for 6 weeks. It is important to take this every day for this period of time, as it helps to calm down the stomach lining and help it heal.  We also treated you for high potassium. We are not sure what caused this, but it may be related to your diet or the lisinopril.  - Please DO NOT take the lisinopril at home until you follow up with your kidney doctor or primary doctor.  - We have also given you a list of high potassium foods to try to avoid. - Please continue attending your dialysis sessions as usual.  Please schedule an appointment with your primary doctor in the next 1-2 weeks to follow up on how you are doing after leaving the hospital.   Increase activity slowly   Complete by:  As directed      Signed: HColbert Ewing MD 09/06/2017, 5:18 PM   Pager: [phone]

## 2017-09-05 NOTE — Progress Notes (Signed)
   Subjective: No acute events overnight following admission.  Tolerated dialysis session well.  Notes continued intermittent epigastric pain and nausea, tolerated breakfast well.  No vomiting. Notes a bilateral headache ongoing since onset of sx.   Objective:  Vital signs in last 24 hours: Vitals:   09/04/17 1731 09/04/17 2045 09/05/17 0448 09/05/17 0818  BP: (!) 180/108 139/88 (!) 136/96 (!) 146/89  Pulse: 97 93 81 80  Resp: 18 18  18   Temp: 98.9 F (37.2 C) 98.6 F (37 C) 98.8 F (37.1 C) 98.9 F (37.2 C)  TempSrc: Oral Oral Oral Oral  SpO2: 100% 100% 98% 100%  Weight:      Height:       General: Resting in bed comfortably, no acute distress CV: Regular rate and rhythm Resp: Clear anterior breath sounds, normal work of breathing, no distress  Abd: Soft, +BS, tenderness to palpation of epigastric region, no rebound or guarding Extr: No lower extremity edema, left upper extremity AV fistula with thrill Neuro: Alert and oriented x3 Skin: Warm, dry      Assessment/Plan:  ESRD on MWF HD, Hyperkalemia Found to have potassium of 7.1 on admission, went for urgent HD following initial evaluation.  No clear explanation for hyperkalemia as patient reports good adherence with HD sessions and diet.  Potassium remains elevated at 5.8 this morning.  She reports she still makes urine and was on home Lasix (though small dose at 20 mg daily).  Resuming Lasix may be adjunct to lowering potassium.  --Nephrology consulted, appreciate assistance. F/u updated recommendations    Epigastric Pain, Nausea/vomiting Patient presented with onset of epigastric pain with associated nausea and vomiting.  Reports continued pain and nausea this morning, no vomiting.  Previously evaluated by GI for these symptoms with unremarkable EGD and colonoscopy, CT imaging in the past also negative.  GI speculated that this may be side effect of chronic pain medication for chronic msk pain.  No obvious explanation on labs  during this admission.  We will treat supportively and monitor her symptoms for improvement prior to considering repeat imaging. --Cont zofran prn, daily PPI  --Cont renal diet   H/o HTN Presented with hypertensive urgency, blood pressures have improved with resumption of home hydralazine and metoprolol, as well as dialysis.  Lisinopril held on admission. Noted to have chest pain along with her epigastric pain on presentation, though unlikely to be acs.  --Cont hydralazine 25 mg TID, Metoprolol xl 100 mg daily     Dispo: Anticipated discharge pending clinical improvement.    Tawny Asal, MD 09/05/2017, 10:40 AM Pager: [phone]

## 2017-09-05 NOTE — Progress Notes (Signed)
Date: 09/05/2017  Patient name: Valerie Yates  Medical record number: [id number]  Date of birth: [birth year]   I have seen and evaluated Valerie Yates and discussed their care with the Residency Team.  In brief, patient is a 42 year old female with a past medical history of hypertension, GERD, end-stage renal disease on hemodialysis, anxiety who presented to the hospital with chest pain and epigastric pain over the last day.  Patient was at home when she developed sudden onset of left-sided chest pain with associated epigastric abdominal pain.  Patient states that the chest pain was 7 out of 10 in intensity, radiating to the left side of her neck and associated with diaphoresis.  Patient noted that her blood pressure was elevated with systolics in the 119E and she was also tachycardic with pulses in the 120s.  Patient also complains of epigastric pain over the last day.  She states that she had her gallbladder removed in 2006.  She states that her abdominal pain is constant, described as a gnawing sensation, epigastric, worsened with oral intake and associated with nausea.  Patient states that she had Gatorade on the night prior to admission as it was only thing that she can keep down.  No fevers or chills, no palpitations, no shortness of breath, no lightheadedness, no syncope, no focal weakness, tingling or numbness, no diaphoresis.  Patient also complains of associated headache which were occasionally frontal or occasionally over her vertex.  Patient states that the headache is constant and sharp in nature.  Patient states that she has been compliant with her hemodialysis sessions and with her diet at home.  Today patient states that her abdominal pain and nausea are still persistent.  She denies any chest pain today.  As per resident admit note  PMHx, Fam Hx, and/or Soc Hx : As per resident admit note  Vitals:   09/05/17 0448 09/05/17 0818  BP: (!) 136/96 (!) 146/89  Pulse: 81 80  Resp:  18  Temp: 98.8  F (37.1 C) 98.9 F (37.2 C)  SpO2: 98% 100%   General: Awake, alert, oriented x3, NAD CVS: Regular rate and rhythm, normal heart sounds Lungs: CTA bilaterally Abdomen: Soft, mild epigastric tenderness, no rebound, no guarding, nondistended, normoactive bowel sounds Extremities: No edema noted  Assessment and Plan: I have seen and evaluated the patient as outlined above. I agree with the formulated Assessment and Plan as detailed in the residents' note, with the following changes:   1.  Hyperkalemia in the setting of ESRD on hemodialysis: -Patient was noted to have an elevated potassium of 7.1 on admission without acute EKG changes.  The etiology of her hyperkalemia remains uncertain at this time as she is compliant with her hemodialysis sessions and is usually compliant with her diet at home. -Patient received insulin, dextrose, calcium gluconate and albuterol in the ED -Nephrology follow-up recommendations appreciated -Patient status post urgent hemodialysis yesterday for correction of her potassium -Her potassium is mildly elevated today at 5.8 -We will resume her home Lasix as well -We will monitor electrolytes closely for now  2.  Chest pain: -Patient presented to the ED with atypical chest pain in the setting of uncontrolled hypertension and tachycardia.  She had a recent stress test done in December 2018 which was low risk.  EKG did show some mild inferolateral ST depression setting of tachycardia and hypertension.  I do not believe that this is secondary to ACS. -Troponins have been trended and have shown mild elevation to 0.06 likely  secondary to ESRD. -No further work-up at this time. -We will monitor closely -Hypertension is better controlled today.  Continue with her home medications.  3.  Abdominal pain: -Patient presented with epigastric abdominal pain and nausea and was noted to have a mildly elevated alk phos and a mildly elevated lipase level.She is noted to be tender  in the epigastric region on physical exam.  The etiology of her abdominal pain remains uncertain at this time.  She did have a cholecystectomy done in 2006.  She remains a little nauseous with some abdominal pain today.  It is possible that she may be having reflux given her epigastric pain and worsening pain after eating. -We will attempt conservative management with PPI and antinausea medication as she is tolerating oral intake at this time -If her pain persists would consider CT abdomen pelvis to further investigate the etiology of her pain -She may need follow-up for an EGD if this persists and work-up is negative  Aldine Contes, MD 4/30/201910:38 AM

## 2017-09-05 NOTE — Progress Notes (Addendum)
[facility] Brief Note  Valerie Yates is 42 year old female with ESRD on HD, (MWF [place]), resistant HTN, dilated CM, s/p cholecystectomy. HD started 04/2015, active on transplant list at [facility].   She presented to [facility] ED with chest and abdominal pain. Found to have K+ 7.1 requiring urgent dialysis. She is admitted under observation status for further evaluation. EKG without acute changes. CXR without edema.  Ca gluconate, albuterol, insulin, sodium bicarb dosed in ED.   -K+ 5.8 post HD -Needs fistula eval. She prefers to do as outpatient -She does not want to do HD back to back days. Concern for infiltration of fistula.   Dialysis Orders: [facility] MWF 3.25h 180NRe 350/600 2K/2Ca Profile 2  EDW 53.4kg  No heparin Mircera 62mcg IV q 2weeks (to start 5/1)   Plan: -Pt declines HD today. Plan HD tomorrow on schedule. Low K bath -Has outpatient fistulogram appointment at [facility] on 5/2 at 8:30 am  -Full consult if changes to inpatient status   Ogechi Larina Earthly PA-C [place] Pager [phone] 09/05/2017,10:50 AM  Pt seen, examined and agree w A/P as above.  Having problems w/ hyperkalemia which is prob related to an access issue, will plan dialysis tomorrow with low K bath and we have rescheduled her fistulogram for outpt on Thursday so please dc tomorrow after dialysis barring any unforeseen issues.  Kelly Splinter MD [employer] pager [phone]   09/05/2017, 12:36 PM

## 2017-09-06 DIAGNOSIS — G8929 Other chronic pain: Secondary | ICD-10-CM

## 2017-09-06 DIAGNOSIS — I5020 Unspecified systolic (congestive) heart failure: Secondary | ICD-10-CM | POA: Diagnosis not present

## 2017-09-06 DIAGNOSIS — R0789 Other chest pain: Secondary | ICD-10-CM | POA: Diagnosis not present

## 2017-09-06 DIAGNOSIS — N186 End stage renal disease: Secondary | ICD-10-CM

## 2017-09-06 DIAGNOSIS — Z992 Dependence on renal dialysis: Secondary | ICD-10-CM

## 2017-09-06 DIAGNOSIS — E875 Hyperkalemia: Secondary | ICD-10-CM

## 2017-09-06 DIAGNOSIS — Z880 Allergy status to penicillin: Secondary | ICD-10-CM

## 2017-09-06 DIAGNOSIS — M549 Dorsalgia, unspecified: Secondary | ICD-10-CM

## 2017-09-06 DIAGNOSIS — Z79891 Long term (current) use of opiate analgesic: Secondary | ICD-10-CM

## 2017-09-06 DIAGNOSIS — Z881 Allergy status to other antibiotic agents status: Secondary | ICD-10-CM

## 2017-09-06 DIAGNOSIS — K2900 Acute gastritis without bleeding: Secondary | ICD-10-CM

## 2017-09-06 DIAGNOSIS — K2970 Gastritis, unspecified, without bleeding: Secondary | ICD-10-CM

## 2017-09-06 DIAGNOSIS — R1013 Epigastric pain: Secondary | ICD-10-CM | POA: Diagnosis not present

## 2017-09-06 DIAGNOSIS — Z9049 Acquired absence of other specified parts of digestive tract: Secondary | ICD-10-CM | POA: Diagnosis not present

## 2017-09-06 DIAGNOSIS — I132 Hypertensive heart and chronic kidney disease with heart failure and with stage 5 chronic kidney disease, or end stage renal disease: Secondary | ICD-10-CM

## 2017-09-06 DIAGNOSIS — Z885 Allergy status to narcotic agent status: Secondary | ICD-10-CM

## 2017-09-06 DIAGNOSIS — Z888 Allergy status to other drugs, medicaments and biological substances status: Secondary | ICD-10-CM

## 2017-09-06 DIAGNOSIS — Z8659 Personal history of other mental and behavioral disorders: Secondary | ICD-10-CM

## 2017-09-06 DIAGNOSIS — R110 Nausea: Secondary | ICD-10-CM | POA: Diagnosis not present

## 2017-09-06 LAB — RENAL FUNCTION PANEL
ALBUMIN: 3.3 g/dL — AB (ref 3.5–5.0)
Anion gap: 10 (ref 5–15)
BUN: 35 mg/dL — AB (ref 6–20)
CALCIUM: 8.7 mg/dL — AB (ref 8.9–10.3)
CO2: 31 mmol/L (ref 22–32)
CREATININE: 6.51 mg/dL — AB (ref 0.44–1.00)
Chloride: 97 mmol/L — ABNORMAL LOW (ref 101–111)
GFR calc Af Amer: 8 mL/min — ABNORMAL LOW (ref >60)
GFR calc non Af Amer: 7 mL/min — ABNORMAL LOW (ref >60)
GLUCOSE: 91 mg/dL (ref 65–99)
PHOSPHORUS: 4 mg/dL (ref 2.5–4.6)
Potassium: 5.9 mmol/L — ABNORMAL HIGH (ref 3.5–5.1)
SODIUM: 138 mmol/L (ref 135–145)

## 2017-09-06 MED ORDER — SODIUM CHLORIDE 0.9 % IV SOLN: 100.0000 mL | INTRAVENOUS | Status: DC | PRN

## 2017-09-06 MED ORDER — OXYCODONE-ACETAMINOPHEN 5-325 MG PO TABS
ORAL_TABLET | ORAL | Status: AC
  Filled 2017-09-06: qty 1

## 2017-09-06 MED ORDER — PANTOPRAZOLE SODIUM 40 MG PO TBEC: 40.0000 mg | DELAYED_RELEASE_TABLET | Freq: Every day | ORAL | 0 refills | Status: DC

## 2017-09-06 NOTE — Progress Notes (Signed)
Patient back to room from HD in NAD. Awaiting orders for discharge, MD notified.

## 2017-09-06 NOTE — Progress Notes (Signed)
Subjective:  Valerie Yates was seen lying in bed comfortably this morning. She reports continued epigastric pain that is typically constant, however does worsen after meals. She reports that her pain medication takes the pain to a constant dull ache, however after meals and without the pain medication, it is a sharp pressure. The pain does not radiate to his back. She also continues to have nausea, no reported emesis. She states that she was previously taking prilosec only as needed. She does not smoke or drink alcohol.  She endorses frustration regarding the switch to a clear liquid diet due to the need for fluid restriction as a dialysis patient. Discussed with patient and husband that most likely etiology for her epigastric pain is gastritis and that no further imaging are needed as inpatient. Recommended taking prilosec every day to help with acid suppression - patient amenable to plan.  Objective:  Vital signs in last 24 hours: Vitals:   09/05/17 0818 09/05/17 1554 09/05/17 2139 09/06/17 0536  BP: (!) 146/89 132/90 (!) 168/109 (!) 136/96  Pulse: 80 85 (!) 105 (!) 102  Resp: 18 18 16 16   Temp: 98.9 F (37.2 C) 98.7 F (37.1 C) 99.5 F (37.5 C) 98.9 F (37.2 C)  TempSrc: Oral Oral Oral Oral  SpO2: 100% 100% 99% 100%  Weight:      Height:       GEN: Lying in bed comfortably in NAD CV: NR & RR, no m/r/g PULM: CTAB, no wheezes or rales ABD: Soft, TTP diffusely, mostly epigastric.  Assessment/Plan:  Active Problems:   Chest pain  ESRD on HD MWF Hyperkalemia K 7.1 on admission, s/p urgent HD. K remains elevated at 5.9 this morning, due for dialysis today. Etiology for hyperkalemia is thought to be secondary to indiscretion with avoiding high potassium foods as well as possible effect of lisinopril. Less likely hemolysis (normal indirect bili and stable hemoglobin). - Nephrology consulted - HD today - Holding home lisinopril - Patient provided with education regarding high  potassium foods to avoid - Likely discharge today after HD - Outpatient fistulogram study rescheduled for 5/2  Epigastric pain, N/V, most likely 2/2 gastritis Patient has had previous evaluation for her epigastric pain, which included an EGD, colonoscopy, and CT imaging. EGD in 06/2016 showed gastritis. She was taking prilosec only as needed prior to admission. Colonoscopy at that time was unremarkable. She was taking lisinopril, which is very rarely associated with pancreatitis, however CT A/P in 2017 was unremarkable, notably no pancreatic calcifications. CT did show retained food products in stomach, however patient denies early satiety and has no hx of diabetes, making gastroparesis less likely although still a consideration in the differential. Also less likely is mesenteric ischemia given that her weight has been stable over the last 2 years. The final consideration would be somatization disorder given her history of anxiety however this would be a diagnosis of exclusion. Most likely etiology at this time is recurrent gastritis in the setting of inconsistent PPI use. I do not think she requires repeat endoscopy at this time and we will manage with 6 weeks of daily PPI and outpatient follow up. - Pantoprazole 40mg  daily - F/u with PCP outpatient  Hx of HTN Presented with hypertensive urgency. Blood pressure has improved with resumption of home hydralazine and metoprolol, as well as dialysis. Holding home lisinopril given hyperkalemia. - Continue home hydralazine 25mg  TID and metoprolol XL 100mg  daily  Dispo: Anticipated discharge in approximately 0-1 day(s).   Colbert Ewing, MD  09/06/2017, 6:31 AM Pager: Mamie Nick [phone]

## 2017-09-06 NOTE — Progress Notes (Signed)
[facility] Brief Note  Valerie Yates is 42 year old female with ESRD on HD, (MWF [place]), resistant HTN, dilated CM, s/p cholecystectomy. HD started 04/2015, active on transplant list at [facility].   She presented to [facility] ED with chest and abdominal pain. Found to have K+ 7.1 requiring urgent dialysis. She is admitted under observation status for further evaluation. EKG without acute changes. CXR without edema.  Ca gluconate, albuterol, insulin, sodium bicarb dosed in ED.   -K+ 5.8 post HD -Needs fistula eval. Have scheduled her for fistulogram tomorrow (Thursday) w Dr Augustin Coupe at [place] [facility].    Dialysis Orders: [facility] MWF 3.25h 180NRe 350/600 2K/2Ca Profile 2  EDW 53.4kg  No heparin Mircera 96mcg IV q 2weeks (to start 5/1)   Plan: -dialysis today, ok for dc after hd today -outpatient fistulogram tomorrow at [place]  Valerie Splinter MD [facility] pgr [phone]   09/06/2017, 11:37 AM

## 2017-09-06 NOTE — Progress Notes (Signed)
Discharge teaching complete. Meds, diet, activity, follow up appointments reviewed and all questions answered. Patient discharged home via wheelchair with husband. Copy of instructions given to patient and prescription sent to pharmacy.

## 2017-09-06 NOTE — Progress Notes (Signed)
Internal Medicine Attending  Date: 09/06/2017  Patient name: Valerie Yates Medical record number: [id number] Date of birth: 04/15/1976 Age: 42 y.o. Gender: female  I saw and evaluated the patient. I reviewed the resident's note by Dr. Ronalee Red and I agree with the resident's findings and plans as documented in her progress note.  When seen on rounds this morning Valerie Yates noted continued epigastric pain that she characterized as a dull pressure occasionally radiating to the right upper quadrant that was worsened with food. She denies early satiety and has no history alcohol use or drug abuse. She has had a prior cholecystectomy where she was found to have sludge. She has no history of pancreatitis and a CT scan in 2017 was without calcifications. I failed to ask about stearrhea during the visit. When asked about her gastritis she admits that she has only been taking the pantoprazole intermittently. She has a stable weight suggesting against significant mesenteric ischemia as a cause of the pain. Somatizations disorder is always in the differential of abdominal pain, but is a diagnosis of exclusion and this exacerbation seems to be acute rather than chronic. Thus, our working diagnosis is recurrent gastritis.  Physical exam revealed continued epigastric tenderness to palpation. We do not believe that an EGD at this time would reveal anything that would change our management and recommend daily PPI therapy for a minimum of 6 weeks with follow-up in her outpatient primary care provider's office. She is stable for discharge home today.

## 2017-09-06 NOTE — Procedures (Signed)
   I was present at this dialysis session, have reviewed the session itself and made  appropriate changes Kelly Splinter MD [place] pager [phone]   09/06/2017, 11:37 AM

## 2017-09-14 ENCOUNTER — Ambulatory Visit: Payer: Medicare Other | Admitting: Cardiology

## 2017-10-17 NOTE — Progress Notes (Deleted)
Cardiology Office Note   Date:  10/17/2017   ID:  Valerie Yates, DOB May 15, 1975, MRN [id number]  PCP:  Enid Skeens., MD  Cardiologist: Dr. Ellyn Hack No chief complaint on file.    History of Present Illness: Valerie Yates is a 42 y.o. female who presents for ongoing assessment and management of difficult to control hypertension on multiple antihypertensive medications.  She has a history of end-stage renal disease with dialysis 3 times a week.  Other history includes GERD, anxiety, hyperparathyroidism due to renal insufficiency, and iron deficiency anemia.  She was last seen in the office on 08/16/2017 at which time she was asked to choose which physician was to follow her hypertension as so many other providers have been manipulating her medications for better control.  The patient has asked Dr. Ellyn Hack and cardiology to follow blood pressure control measures.  She had been inconsistent on taking medications on a regular timetable sometimes forgetting to take her medicines in the morning or oversleeping and waiting to take her medications later in the day.  She was started on hydralazine 12.5 mg twice daily.  A 7-day cardiac monitor was placed due to her complaints of palpitations and tachycardia she evaluate rate rhythm and morphology.  The patient called our office on 08/24/2017 due to fluid retention.  We started her on low-dose Lasix 20 mg daily to help with the edema and also increase hydralazine to 25 mg 3 times daily for better blood pressure control.  Unfortunately, the patient was admitted to the hospital in May 2019 in the setting of hyperkalemia.  Lisinopril was discontinued it was felt this was contributing to her hyperkalemia.  The patient also was treated for gastritis and placed on PPI for 6 weeks.  She was continued on hydralazine 25 mg 3 times daily, metoprolol 100 mg daily, and furosemide 20 mg daily.  She was discharged on 09/06/2017.     Past Medical History:  Diagnosis Date  .  Anxiety   . Complication of anesthesia    woke up before they took the endotra. tube and tries to take out per patient  . ESRD (end stage renal disease) on dialysis ([place])    Mon, Wed, Fri. at [facility] on [address]  . GERD (gastroesophageal reflux disease)   . Headache    "after dialysis" reports that only when excessive fluid is removed at dialysis  . History of kidney infection   . Hyperkalemia 09/05/2017  . Hyperparathyroidism due to renal insufficiency ([place])   . Hypertension   . Iron deficiency anemia   . Migraine    "a few/year": (09/14/2015)  . Shortness of breath dyspnea    resolved per patient was due to fluid overload  . UTI (lower urinary tract infection)    recurrent    Past Surgical History:  Procedure Laterality Date  . ABDOMINAL HYSTERECTOMY  2006  . AV FISTULA PLACEMENT Left 01/27/2015   Procedure:  BASILIC VEIN TRANSPOSITION ;  Surgeon: Angelia Mould, MD;  Location: [facility];  Service: Vascular;  Laterality: Left;  . DILATION AND CURETTAGE OF UTERUS    . LAPAROSCOPIC CHOLECYSTECTOMY  2005  . NM MYOVIEW LTD  08/2014   No fixed or reversible perfusion defect - to suggest Infarct or Ischeima.. EF ~70%.    Marland Kitchen SHOULDER ARTHROSCOPY W/ ROTATOR CUFF REPAIR Left 2012  . SHOULDER ARTHROSCOPY WITH LABRAL REPAIR Right 08/11/2016   Procedure: RIGHT SHOULDER ARTHROSCOPY WITH LABRAL REPAIR;  Surgeon: Justice Britain, MD;  Location: [facility];  Service:  Orthopedics;  Laterality: Right;  . TRANSTHORACIC ECHOCARDIOGRAM  04/2014   Low normal EF of 50-55%. No RWMA.  Tr MR/TR.  NORMAL  . TRANSTHORACIC ECHOCARDIOGRAM  10/27/2015   [facility] (in setting of volume overload - leading to HD): Mildly impaired LV function with EF of 45-50%. Pseudo-normal LV filling (GR 2 DD. Moderate MR and moderate TR. RVSP estimated at 52 mmHg.  Marland Kitchen TRANSTHORACIC ECHOCARDIOGRAM  03/2017   Mild LVH.  Mildly reduced EF of 45-50%.  GRII DD.  Mild MR.  Moderate LA dilation.  Mild TR (improved from moderate MR  and TR on last echo, suggestive of functional regurgitation due to LV dilation from volume overload  . TUBAL LIGATION  2003     Current Outpatient Medications  Medication Sig Dispense Refill  . ALPRAZolam (XANAX) 0.5 MG tablet Take 0.5 mg by mouth 3 (three) times daily.     . calcitRIOL (ROCALTROL) 0.25 MCG capsule Take 0.75 mcg by mouth daily.  2  . furosemide (LASIX) 20 MG tablet Take 1 tablet (20 mg total) by mouth daily. 30 tablet 3  . hydrALAZINE (APRESOLINE) 25 MG tablet Take 1 tablet (25 mg total) by mouth 3 (three) times daily. 90 tablet 3  . metoprolol succinate (TOPROL-XL) 100 MG 24 hr tablet Take 100 mg by mouth daily. Take with or immediately following a meal.    . ondansetron (ZOFRAN) 4 MG tablet Take 1-2 tablets (4-8 mg total) by mouth every 8 (eight) hours as needed for nausea or vomiting. 60 tablet 3  . oxyCODONE-acetaminophen (PERCOCET) 5-325 MG tablet Take 1-2 tablets by mouth every 4 (four) hours as needed. 40 tablet 0  . pantoprazole (PROTONIX) 40 MG tablet Take 1 tablet (40 mg total) by mouth daily. 45 tablet 0  . RENVELA 800 MG tablet Take 1,600-2,400 mg by mouth 3 (three) times daily before meals. Takes 1600 mg with snacks. 2400 mg with meals  6   No current facility-administered medications for this visit.     Allergies:   Azithromycin; Erythromycin; Penicillins; Amlodipine; Valsartan; and Morphine sulfate    Social History:  The patient  reports that she has never smoked. She has never used smokeless tobacco. She reports that she does not drink alcohol or use drugs.   Family History:  The patient's family history includes Breast cancer in her mother; Cancer in her mother; Diabetes in her maternal grandmother; Heart disease in her maternal grandfather.    ROS: All other systems are reviewed and negative. Unless otherwise mentioned in H&P    PHYSICAL EXAM: VS:  There were no vitals taken for this visit. , BMI There is no height or weight on file to calculate  BMI. GEN: Well nourished, well developed, in no acute distress  HEENT: normal  Neck: no JVD, carotid bruits, or masses Cardiac: ***RRR; no murmurs, rubs, or gallops,no edema  Respiratory:  clear to auscultation bilaterally, normal work of breathing GI: soft, nontender, nondistended, + BS MS: no deformity or atrophy  Skin: warm and dry, no rash Neuro:  Strength and sensation are intact Psych: euthymic mood, full affect   EKG:  EKG {ACTION; IS/IS [id number]} ordered today. The ekg ordered today demonstrates ***   Recent Labs: 09/04/2017: ALT 40; Hemoglobin 10.4; Platelets 255 09/06/2017: BUN 35; Creatinine, Ser 6.51; Potassium 5.9; Sodium 138    Lipid Panel No results found for: CHOL, TRIG, HDL, CHOLHDL, VLDL, LDLCALC, LDLDIRECT    Wt Readings from Last 3 Encounters:  09/06/17 117 lb 8.1 oz (53.3  kg)  08/16/17 120 lb (54.4 kg)  04/11/17 117 lb (53.1 kg)      Other studies Reviewed: Additional studies/ records that were reviewed today include: ***. Review of the above records demonstrates: ***   ASSESSMENT AND PLAN:  1.  ***   Current medicines are reviewed at length with the patient today.    Labs/ tests ordered today include: *** Phill Myron. West Pugh, ANP, AACC   10/17/2017 6:34 PM    [facility] [address] [place], [place] [address] Phone: [phone]; Fax: [phone]

## 2017-10-18 ENCOUNTER — Ambulatory Visit: Payer: Medicare Other | Admitting: Adult Health

## 2017-11-12 NOTE — Progress Notes (Deleted)
Cardiology Office Note   Date:  11/12/2017   ID:  Valerie Yates, DOB March 25, 1976, MRN [id number]  PCP:  Enid Skeens., MD  Cardiologist: Dr. Ellyn Hack No chief complaint on file.    History of Present Illness: Valerie Yates is a 42 y.o. female who presents for ongoing assessment and management of systolic dysfunction, with history of moderate MR and TR, history of exertional fatigue and dyspnea on exertion.  Other history includes end-stage renal disease with dialysis, hypertension, and anxiety.  Most recent echocardiogram revealed EF of 45% to 50%, stable grade 2 (moderate) diastolic dysfunction with high filling pressures but not as prominent mitral regurg, tricuspid regurg or right-sided filling pressures.  Most recent stress test revealed no evidence of ischemia.  On last office visit the patient's blood pressure was not well controlled and medications have been manipulated by various physician encounters.  The patient requested that Dr. Ellyn Hack and cardiology follow her blood pressure and manage her medications.  She was not consistently taking her clonidine at specified times depending upon when she awoke in the morning.  She was advised on taking her medication the same time every day for consistency and to avoid rebound hypertension.  She often cuts back on her clonidine on days of dialysis to prevent hypotension  Our plan was to wean her from clonidine over several days, and increased dose of hydralazine instead.  She was to begin hydralazine 12.5 mg 3 times daily.  She also complained of frequent episodes of tachycardia and I placed a cardiac monitor on the patient to evaluate for duration and morphology of heart rate.  She also expressed a good bit of anxiety concerning her overall health status.  She was advised to continue Xanax as directed and follow with PCP for medication adjustment.  On follow-up phone call the patient blood pressure remained elevated and she was increased on hydralazine to  25 mg 3 times daily.  The patient did not wear a cardiac monitor and therefore heart rate evaluation could not be followed.    Past Medical History:  Diagnosis Date  . Anxiety   . Complication of anesthesia    woke up before they took the endotra. tube and tries to take out per patient  . ESRD (end stage renal disease) on dialysis ([place])    Mon, Wed, Fri. at [facility] on [address]  . GERD (gastroesophageal reflux disease)   . Headache    "after dialysis" reports that only when excessive fluid is removed at dialysis  . History of kidney infection   . Hyperkalemia 09/05/2017  . Hyperparathyroidism due to renal insufficiency ([place])   . Hypertension   . Iron deficiency anemia   . Migraine    "a few/year": (09/14/2015)  . Shortness of breath dyspnea    resolved per patient was due to fluid overload  . UTI (lower urinary tract infection)    recurrent    Past Surgical History:  Procedure Laterality Date  . ABDOMINAL HYSTERECTOMY  2006  . AV FISTULA PLACEMENT Left 01/27/2015   Procedure:  BASILIC VEIN TRANSPOSITION ;  Surgeon: Angelia Mould, MD;  Location: [facility];  Service: Vascular;  Laterality: Left;  . DILATION AND CURETTAGE OF UTERUS    . LAPAROSCOPIC CHOLECYSTECTOMY  2005  . NM MYOVIEW LTD  08/2014   No fixed or reversible perfusion defect - to suggest Infarct or Ischeima.. EF ~70%.    Marland Kitchen SHOULDER ARTHROSCOPY W/ ROTATOR CUFF REPAIR Left 2012  . SHOULDER ARTHROSCOPY WITH LABRAL REPAIR  Right 08/11/2016   Procedure: RIGHT SHOULDER ARTHROSCOPY WITH LABRAL REPAIR;  Surgeon: Justice Britain, MD;  Location: [place] [place] [facility];  Service: Orthopedics;  Laterality: Right;  . TRANSTHORACIC ECHOCARDIOGRAM  04/2014   Low normal EF of 50-55%. No RWMA.  Tr MR/TR.  NORMAL  . TRANSTHORACIC ECHOCARDIOGRAM  10/27/2015   [facility] (in setting of volume overload - leading to HD): Mildly impaired LV function with EF of 45-50%. Pseudo-normal LV filling (GR 2 DD. Moderate MR and moderate TR. RVSP estimated  at 52 mmHg.  Marland Kitchen TRANSTHORACIC ECHOCARDIOGRAM  03/2017   Mild LVH.  Mildly reduced EF of 45-50%.  GRII DD.  Mild MR.  Moderate LA dilation.  Mild TR (improved from moderate MR and TR on last echo, suggestive of functional regurgitation due to LV dilation from volume overload  . TUBAL LIGATION  2003     Current Outpatient Medications  Medication Sig Dispense Refill  . ALPRAZolam (XANAX) 0.5 MG tablet Take 0.5 mg by mouth 3 (three) times daily.     . calcitRIOL (ROCALTROL) 0.25 MCG capsule Take 0.75 mcg by mouth daily.  2  . furosemide (LASIX) 20 MG tablet Take 1 tablet (20 mg total) by mouth daily. 30 tablet 3  . hydrALAZINE (APRESOLINE) 25 MG tablet Take 1 tablet (25 mg total) by mouth 3 (three) times daily. 90 tablet 3  . metoprolol succinate (TOPROL-XL) 100 MG 24 hr tablet Take 100 mg by mouth daily. Take with or immediately following a meal.    . ondansetron (ZOFRAN) 4 MG tablet Take 1-2 tablets (4-8 mg total) by mouth every 8 (eight) hours as needed for nausea or vomiting. 60 tablet 3  . oxyCODONE-acetaminophen (PERCOCET) 5-325 MG tablet Take 1-2 tablets by mouth every 4 (four) hours as needed. 40 tablet 0  . pantoprazole (PROTONIX) 40 MG tablet Take 1 tablet (40 mg total) by mouth daily. 45 tablet 0  . RENVELA 800 MG tablet Take 1,600-2,400 mg by mouth 3 (three) times daily before meals. Takes 1600 mg with snacks. 2400 mg with meals  6   No current facility-administered medications for this visit.     Allergies:   Azithromycin; Erythromycin; Penicillins; Amlodipine; Valsartan; and Morphine sulfate    Social History:  The patient  reports that she has never smoked. She has never used smokeless tobacco. She reports that she does not drink alcohol or use drugs.   Family History:  The patient's family history includes Breast cancer in her mother; Cancer in her mother; Diabetes in her maternal grandmother; Heart disease in her maternal grandfather.    ROS: All other systems are reviewed  and negative. Unless otherwise mentioned in H&P    PHYSICAL EXAM: VS:  There were no vitals taken for this visit. , BMI There is no height or weight on file to calculate BMI. GEN: Well nourished, well developed, in no acute distress  HEENT: normal  Neck: no JVD, carotid bruits, or masses Cardiac: ***RRR; no murmurs, rubs, or gallops,no edema  Respiratory:  clear to auscultation bilaterally, normal work of breathing GI: soft, nontender, nondistended, + BS MS: no deformity or atrophy  Skin: warm and dry, no rash Neuro:  Strength and sensation are intact Psych: euthymic mood, full affect   EKG:  EKG {ACTION; IS/IS [id number]} ordered today. The ekg ordered today demonstrates ***   Recent Labs: 09/04/2017: ALT 40; Hemoglobin 10.4; Platelets 255 09/06/2017: BUN 35; Creatinine, Ser 6.51; Potassium 5.9; Sodium 138    Lipid Panel No results found for: CHOL,  TRIG, HDL, CHOLHDL, VLDL, LDLCALC, LDLDIRECT    Wt Readings from Last 3 Encounters:  09/06/17 117 lb 8.1 oz (53.3 kg)  08/16/17 120 lb (54.4 kg)  04/11/17 117 lb (53.1 kg)      Other studies Reviewed: Additional studies/ records that were reviewed today include: ***. Review of the above records demonstrates: ***   ASSESSMENT AND PLAN:  1.  ***   Current medicines are reviewed at length with the patient today.    Labs/ tests ordered today include: *** Phill Myron. West Pugh, ANP, AACC   11/12/2017 2:17 PM    [facility] [address] [place], [place] [address] Phone: [phone]; Fax: [phone]

## 2017-11-13 ENCOUNTER — Encounter

## 2017-11-13 ENCOUNTER — Ambulatory Visit: Payer: Medicare Other | Admitting: Adult Health

## 2017-11-14 ENCOUNTER — Encounter: Payer: Self-pay | Admitting: *Deleted

## 2017-12-11 NOTE — Progress Notes (Signed)
Cardiology Office Note   Date:  12/12/2017   ID:  Valerie Yates, DOB 01/05/1976, MRN [id number]  PCP:  Enid Skeens., MD  Cardiologist: Dr. Ellyn Hack  Chief Complaint  Patient presents with  . Follow-up    discuss medicine changes     History of Present Illness: Valerie Yates is a 42 y.o. female who presents for HTN, tachycardia, with history of ESRD with dialysis on MWF and anxiety about health issues. On last office visit we stopped clonidine as she was complaining of sleepiness on this medication and started on Hydralazine 25 mg TID. A cardiac cardiac monitor was placed to evaluate for episodes of tachycardia.  She was started on lasix 20 ng daily after complaining of LEE.   She was admitted on 09/06/2017 for hyperkalemia and gastritis,.She was taken off of lisinopril She was continued on hydralazine, metoprolol, and furosemide. BP was improved on discharge.   She has been undergoing dialysis on MWF and is on transplant list. BP has been elevated during dialysis and nephrologist increased hydralazine to 50 mg TID. She states that BP at home is running in the 626'R systolic. She is easily anxious and HR does go up with this.    Past Medical History:  Diagnosis Date  . Anxiety   . Complication of anesthesia    woke up before they took the endotra. tube and tries to take out per patient  . ESRD (end stage renal disease) on dialysis ([place])    Mon, Wed, Fri. at [facility] on [address]  . GERD (gastroesophageal reflux disease)   . Headache    "after dialysis" reports that only when excessive fluid is removed at dialysis  . History of kidney infection   . Hyperkalemia 09/05/2017  . Hyperparathyroidism due to renal insufficiency ([place])   . Hypertension   . Iron deficiency anemia   . Migraine    "a few/year": (09/14/2015)  . Shortness of breath dyspnea    resolved per patient was due to fluid overload  . UTI (lower urinary tract infection)    recurrent    Past Surgical History:  Procedure  Laterality Date  . ABDOMINAL HYSTERECTOMY  2006  . AV FISTULA PLACEMENT Left 01/27/2015   Procedure:  BASILIC VEIN TRANSPOSITION ;  Surgeon: Angelia Mould, MD;  Location: [facility];  Service: Vascular;  Laterality: Left;  . DILATION AND CURETTAGE OF UTERUS    . LAPAROSCOPIC CHOLECYSTECTOMY  2005  . NM MYOVIEW LTD  08/2014   No fixed or reversible perfusion defect - to suggest Infarct or Ischeima.. EF ~70%.    Marland Kitchen SHOULDER ARTHROSCOPY W/ ROTATOR CUFF REPAIR Left 2012  . SHOULDER ARTHROSCOPY WITH LABRAL REPAIR Right 08/11/2016   Procedure: RIGHT SHOULDER ARTHROSCOPY WITH LABRAL REPAIR;  Surgeon: Justice Britain, MD;  Location: [facility];  Service: Orthopedics;  Laterality: Right;  . TRANSTHORACIC ECHOCARDIOGRAM  04/2014   Low normal EF of 50-55%. No RWMA.  Tr MR/TR.  NORMAL  . TRANSTHORACIC ECHOCARDIOGRAM  10/27/2015   [facility] (in setting of volume overload - leading to HD): Mildly impaired LV function with EF of 45-50%. Pseudo-normal LV filling (GR 2 DD. Moderate MR and moderate TR. RVSP estimated at 52 mmHg.  Marland Kitchen TRANSTHORACIC ECHOCARDIOGRAM  03/2017   Mild LVH.  Mildly reduced EF of 45-50%.  GRII DD.  Mild MR.  Moderate LA dilation.  Mild TR (improved from moderate MR and TR on last echo, suggestive of functional regurgitation due to LV dilation from volume overload  . TUBAL  LIGATION  2003     Current Outpatient Medications  Medication Sig Dispense Refill  . ALPRAZolam (XANAX) 0.5 MG tablet Take 0.5 mg by mouth 3 (three) times daily.     . calcitRIOL (ROCALTROL) 0.25 MCG capsule Take 0.75 mcg by mouth daily.  2  . furosemide (LASIX) 20 MG tablet Take 40 mg by mouth daily.    . hydrALAZINE (APRESOLINE) 25 MG tablet Take 50 mg by mouth 3 (three) times daily.    . metoprolol succinate (TOPROL-XL) 100 MG 24 hr tablet Take 100 mg by mouth daily. Take with or immediately following a meal.    . ondansetron (ZOFRAN) 4 MG tablet Take 1-2 tablets (4-8 mg total) by mouth every 8 (eight) hours as  needed for nausea or vomiting. 60 tablet 3  . oxyCODONE-acetaminophen (PERCOCET) 5-325 MG tablet Take 1-2 tablets by mouth every 4 (four) hours as needed. 40 tablet 0  . RENVELA 800 MG tablet Take 1,600-2,400 mg by mouth 3 (three) times daily before meals. Takes 1600 mg with snacks. 2400 mg with meals  6   No current facility-administered medications for this visit.     Allergies:   Azithromycin; Erythromycin; Penicillins; Amlodipine; Valsartan; and Morphine sulfate    Social History:  The patient  reports that she has never smoked. She has never used smokeless tobacco. She reports that she does not drink alcohol or use drugs.   Family History:  The patient's family history includes Breast cancer in her mother; Cancer in her mother; Diabetes in her maternal grandmother; Heart disease in her maternal grandfather.    ROS: All other systems are reviewed and negative. Unless otherwise mentioned in H&P    PHYSICAL EXAM: VS:  BP (!) 165/95   Pulse 77   Ht 5\' 7"  (1.702 m)   Wt 116 lb 12.8 oz (53 kg)   BMI 18.29 kg/m  , BMI Body mass index is 18.29 kg/m. GEN: Well nourished, well developed, in no acute distress  HEENT: normal  Neck: no JVD, carotid bruits, or masses Cardiac: RRR 2/6 systolic murmur( from dialysis shunt); no murmurs, rubs, or gallops,no edema  Respiratory:  clear to auscultation bilaterally, normal work of breathing GI: soft, nontender, nondistended, + BS MS: no deformity or atrophy Dialysis fistula left brachial.  Skin: warm and dry, no rash Neuro:  Strength and sensation are intact Psych: euthymic mood, full affect   EKG:  EKG not completed on office visit.   Recent Labs: 09/04/2017: ALT 40; Hemoglobin 10.4; Platelets 255 09/06/2017: BUN 35; Creatinine, Ser 6.51; Potassium 5.9; Sodium 138    Lipid Panel No results found for: CHOL, TRIG, HDL, CHOLHDL, VLDL, LDLCALC, LDLDIRECT    Wt Readings from Last 3 Encounters:  12/12/17 116 lb 12.8 oz (53 kg)  09/06/17 117  lb 8.1 oz (53.3 kg)  08/16/17 120 lb (54.4 kg)        ASSESSMENT AND PLAN:  1. Hypertension: BP is still not well controlled. I have rechecked in the office manually. BP 156/88. I will increase hydralazine to 75 mg TID. She will continue metoprolol as directed.   2. ESRD: Dialysis MWF. She is on transplant list.   3. Anxiety over health: Can be contributing to her elevated HR at times. I have given her reassurance that we are doing our best to keep her BP under control so that she will be ready for transplant at some point.   Current medicines are reviewed at length with the patient today.  Labs/ tests ordered today include: None  Phill Myron. West Pugh, ANP, AACC   12/12/2017 9:24 AM    [facility] [address], [place], [place] [address] Phone: [phone]; Fax: [phone]

## 2017-12-12 ENCOUNTER — Encounter: Payer: Self-pay | Admitting: Adult Health

## 2017-12-12 ENCOUNTER — Ambulatory Visit (INDEPENDENT_AMBULATORY_CARE_PROVIDER_SITE_OTHER): Payer: Medicare Other | Admitting: Adult Health

## 2017-12-12 VITALS — BP 165/95 | HR 77 | Ht 67.0 in | Wt 116.8 lb

## 2017-12-12 DIAGNOSIS — I10 Essential (primary) hypertension: Secondary | ICD-10-CM

## 2017-12-12 DIAGNOSIS — Z992 Dependence on renal dialysis: Secondary | ICD-10-CM

## 2017-12-12 DIAGNOSIS — F418 Other specified anxiety disorders: Secondary | ICD-10-CM

## 2017-12-12 DIAGNOSIS — N186 End stage renal disease: Secondary | ICD-10-CM

## 2017-12-12 MED ORDER — FUROSEMIDE 20 MG PO TABS: 40.0000 mg | ORAL_TABLET | Freq: Every day | ORAL | 5 refills | Status: DC

## 2017-12-12 MED ORDER — HYDRALAZINE HCL 25 MG PO TABS: 75.0000 mg | ORAL_TABLET | Freq: Three times a day (TID) | ORAL | 5 refills | Status: DC

## 2017-12-12 MED ORDER — METOPROLOL SUCCINATE ER 100 MG PO TB24: 100.0000 mg | ORAL_TABLET | Freq: Every day | ORAL | 5 refills | Status: DC

## 2017-12-12 NOTE — Patient Instructions (Signed)
Medication Instructions:  INCREASE HYDLRALAZINE 75MG  THREE TIMES DAILY-EVERY 8 HOURS  If you need a refill on your cardiac medications before your next appointment, please call your pharmacy.  Follow-Up: Your physician wants you to follow-up in: [place] ([place]), DNP,AACC IF PRIMARY CARDIOLOGIST IS UNAVAILABLE.    Thank you for choosing [facility]!!

## 2017-12-18 ENCOUNTER — Encounter (HOSPITAL_COMMUNITY): Payer: Self-pay | Admitting: *Deleted

## 2017-12-18 ENCOUNTER — Observation Stay (HOSPITAL_COMMUNITY)
Admission: EM | Admit: 2017-12-18 | Discharge: 2017-12-19 | Disposition: A | Discharge status: Home / Self Care | Payer: Medicare Other | Attending: Internal Medicine | Admitting: Internal Medicine

## 2017-12-18 ENCOUNTER — Emergency Department (HOSPITAL_COMMUNITY): Payer: Medicare Other

## 2017-12-18 ENCOUNTER — Other Ambulatory Visit: Payer: Self-pay

## 2017-12-18 ENCOUNTER — Observation Stay (HOSPITAL_COMMUNITY): Payer: Medicare Other

## 2017-12-18 DIAGNOSIS — Z8249 Family history of ischemic heart disease and other diseases of the circulatory system: Secondary | ICD-10-CM | POA: Insufficient documentation

## 2017-12-18 DIAGNOSIS — Z880 Allergy status to penicillin: Secondary | ICD-10-CM | POA: Diagnosis not present

## 2017-12-18 DIAGNOSIS — Z87440 Personal history of urinary (tract) infections: Secondary | ICD-10-CM | POA: Insufficient documentation

## 2017-12-18 DIAGNOSIS — N186 End stage renal disease: Secondary | ICD-10-CM | POA: Diagnosis not present

## 2017-12-18 DIAGNOSIS — F419 Anxiety disorder, unspecified: Secondary | ICD-10-CM | POA: Insufficient documentation

## 2017-12-18 DIAGNOSIS — Z881 Allergy status to other antibiotic agents status: Secondary | ICD-10-CM | POA: Diagnosis not present

## 2017-12-18 DIAGNOSIS — Z888 Allergy status to other drugs, medicaments and biological substances status: Secondary | ICD-10-CM | POA: Insufficient documentation

## 2017-12-18 DIAGNOSIS — Z79899 Other long term (current) drug therapy: Secondary | ICD-10-CM | POA: Insufficient documentation

## 2017-12-18 DIAGNOSIS — E875 Hyperkalemia: Secondary | ICD-10-CM | POA: Diagnosis present

## 2017-12-18 DIAGNOSIS — Z9889 Other specified postprocedural states: Secondary | ICD-10-CM | POA: Diagnosis not present

## 2017-12-18 DIAGNOSIS — D509 Iron deficiency anemia, unspecified: Secondary | ICD-10-CM | POA: Diagnosis not present

## 2017-12-18 DIAGNOSIS — I120 Hypertensive chronic kidney disease with stage 5 chronic kidney disease or end stage renal disease: Principal | ICD-10-CM | POA: Insufficient documentation

## 2017-12-18 DIAGNOSIS — Z9049 Acquired absence of other specified parts of digestive tract: Secondary | ICD-10-CM | POA: Insufficient documentation

## 2017-12-18 DIAGNOSIS — Z90710 Acquired absence of both cervix and uterus: Secondary | ICD-10-CM | POA: Diagnosis not present

## 2017-12-18 DIAGNOSIS — E8889 Other specified metabolic disorders: Secondary | ICD-10-CM | POA: Diagnosis not present

## 2017-12-18 DIAGNOSIS — K219 Gastro-esophageal reflux disease without esophagitis: Secondary | ICD-10-CM | POA: Insufficient documentation

## 2017-12-18 DIAGNOSIS — N2581 Secondary hyperparathyroidism of renal origin: Secondary | ICD-10-CM | POA: Diagnosis not present

## 2017-12-18 DIAGNOSIS — R0602 Shortness of breath: Secondary | ICD-10-CM | POA: Diagnosis not present

## 2017-12-18 DIAGNOSIS — Z992 Dependence on renal dialysis: Secondary | ICD-10-CM | POA: Insufficient documentation

## 2017-12-18 DIAGNOSIS — J810 Acute pulmonary edema: Secondary | ICD-10-CM | POA: Diagnosis present

## 2017-12-18 DIAGNOSIS — Z885 Allergy status to narcotic agent status: Secondary | ICD-10-CM | POA: Insufficient documentation

## 2017-12-18 HISTORY — PX: IR DIALY SHUNT INTRO NEEDLE/INTRACATH INITIAL W/IMG LEFT: IMG6102

## 2017-12-18 LAB — COMPREHENSIVE METABOLIC PANEL
ALBUMIN: 3.9 g/dL (ref 3.5–5.0)
ALK PHOS: 109 U/L (ref 38–126)
ALT: 26 U/L (ref 0–44)
AST: 41 U/L (ref 15–41)
Anion gap: 22 — ABNORMAL HIGH (ref 5–15)
BUN: 53 mg/dL — AB (ref 6–20)
CALCIUM: 9 mg/dL (ref 8.9–10.3)
CO2: 21 mmol/L — AB (ref 22–32)
Chloride: 95 mmol/L — ABNORMAL LOW (ref 98–111)
Creatinine, Ser: 9.04 mg/dL — ABNORMAL HIGH (ref 0.44–1.00)
GFR calc Af Amer: 6 mL/min — ABNORMAL LOW (ref >60)
GFR calc non Af Amer: 5 mL/min — ABNORMAL LOW (ref >60)
GLUCOSE: 82 mg/dL (ref 70–99)
Potassium: 6.5 mmol/L (ref 3.5–5.1)
SODIUM: 138 mmol/L (ref 135–145)
Total Bilirubin: 1.5 mg/dL — ABNORMAL HIGH (ref 0.3–1.2)
Total Protein: 6.8 g/dL (ref 6.5–8.1)

## 2017-12-18 LAB — MRSA PCR SCREENING: MRSA by PCR: NEGATIVE

## 2017-12-18 LAB — BASIC METABOLIC PANEL
ANION GAP: 15 (ref 5–15)
BUN: 14 mg/dL (ref 6–20)
CHLORIDE: 96 mmol/L — AB (ref 98–111)
CO2: 29 mmol/L (ref 22–32)
CREATININE: 4.01 mg/dL — AB (ref 0.44–1.00)
Calcium: 9.4 mg/dL (ref 8.9–10.3)
GFR calc non Af Amer: 13 mL/min — ABNORMAL LOW (ref >60)
GFR, EST AFRICAN AMERICAN: 15 mL/min — AB (ref >60)
GLUCOSE: 109 mg/dL — AB (ref 70–99)
Potassium: 4.2 mmol/L (ref 3.5–5.1)
Sodium: 140 mmol/L (ref 135–145)

## 2017-12-18 LAB — CBC
HCT: 36.4 % (ref 36.0–46.0)
Hemoglobin: 11.2 g/dL — ABNORMAL LOW (ref 12.0–15.0)
MCH: 30.9 pg (ref 26.0–34.0)
MCHC: 30.8 g/dL (ref 30.0–36.0)
MCV: 100.3 fL — ABNORMAL HIGH (ref 78.0–100.0)
Platelets: 233 10*3/uL (ref 150–400)
RBC: 3.63 MIL/uL — ABNORMAL LOW (ref 3.87–5.11)
RDW: 14.8 % (ref 11.5–15.5)
WBC: 7.2 10*3/uL (ref 4.0–10.5)

## 2017-12-18 LAB — I-STAT TROPONIN, ED: TROPONIN I, POC: 0.04 ng/mL (ref 0.00–0.08)

## 2017-12-18 LAB — LIPASE, BLOOD: Lipase: 45 U/L (ref 11–51)

## 2017-12-18 LAB — CBG MONITORING, ED: Glucose-Capillary: 83 mg/dL (ref 70–99)

## 2017-12-18 MED ORDER — SODIUM CHLORIDE 0.9 % IV SOLN: 250.0000 mL | INTRAVENOUS | Status: DC | PRN

## 2017-12-18 MED ORDER — LIDOCAINE-PRILOCAINE 2.5-2.5 % EX CREA: 1.0000 "application " | TOPICAL_CREAM | CUTANEOUS | Status: DC | PRN

## 2017-12-18 MED ORDER — HEPARIN SODIUM (PORCINE) 5000 UNIT/ML IJ SOLN: 5000.0000 [IU] | Freq: Three times a day (TID) | INTRAMUSCULAR | Status: DC

## 2017-12-18 MED ORDER — CALCITRIOL 0.5 MCG PO CAPS
0.5000 ug | ORAL_CAPSULE | Freq: Every day | ORAL | Status: DC
  Administered 2017-12-18 – 2017-12-19 (×2): 0.5 ug via ORAL
  Filled 2017-12-18: qty 1

## 2017-12-18 MED ORDER — ALBUTEROL SULFATE (2.5 MG/3ML) 0.083% IN NEBU
10.0000 mg | INHALATION_SOLUTION | Freq: Once | RESPIRATORY_TRACT | Status: AC
  Administered 2017-12-18: 10 mg via RESPIRATORY_TRACT
  Filled 2017-12-18: qty 12

## 2017-12-18 MED ORDER — FUROSEMIDE 10 MG/ML IJ SOLN
40.0000 mg | Freq: Once | INTRAMUSCULAR | Status: AC
  Administered 2017-12-18: 40 mg via INTRAVENOUS
  Filled 2017-12-18: qty 4

## 2017-12-18 MED ORDER — INSULIN ASPART 100 UNIT/ML ~~LOC~~ SOLN
10.0000 [IU] | Freq: Once | SUBCUTANEOUS | Status: AC
  Administered 2017-12-18: 10 [IU] via INTRAVENOUS
  Filled 2017-12-18: qty 1

## 2017-12-18 MED ORDER — METOPROLOL SUCCINATE ER 100 MG PO TB24
100.0000 mg | ORAL_TABLET | Freq: Every day | ORAL | Status: DC
  Administered 2017-12-18 – 2017-12-19 (×2): 100 mg via ORAL
  Filled 2017-12-18 (×2): qty 1

## 2017-12-18 MED ORDER — FUROSEMIDE 40 MG PO TABS
40.0000 mg | ORAL_TABLET | Freq: Every day | ORAL | Status: DC
  Administered 2017-12-18 – 2017-12-19 (×2): 40 mg via ORAL
  Filled 2017-12-18 (×2): qty 1

## 2017-12-18 MED ORDER — IOPAMIDOL (ISOVUE-300) INJECTION 61%
INTRAVENOUS | Status: AC
  Filled 2017-12-18: qty 100

## 2017-12-18 MED ORDER — SODIUM CHLORIDE 0.9% FLUSH: 3.0000 mL | INTRAVENOUS | Status: DC | PRN

## 2017-12-18 MED ORDER — PENTAFLUOROPROP-TETRAFLUOROETH EX AERO: 1.0000 "application " | INHALATION_SPRAY | CUTANEOUS | Status: DC | PRN

## 2017-12-18 MED ORDER — OXYCODONE-ACETAMINOPHEN 5-325 MG PO TABS
1.0000 | ORAL_TABLET | ORAL | Status: DC | PRN
  Administered 2017-12-18 – 2017-12-19 (×4): 1 via ORAL
  Filled 2017-12-18 (×4): qty 1

## 2017-12-18 MED ORDER — ALPRAZOLAM 0.5 MG PO TABS
0.5000 mg | ORAL_TABLET | Freq: Three times a day (TID) | ORAL | Status: DC
  Administered 2017-12-18 – 2017-12-19 (×4): 0.5 mg via ORAL
  Filled 2017-12-18 (×4): qty 1

## 2017-12-18 MED ORDER — ACETAMINOPHEN 325 MG PO TABS: 650.0000 mg | ORAL_TABLET | Freq: Four times a day (QID) | ORAL | Status: DC | PRN

## 2017-12-18 MED ORDER — HYDRALAZINE HCL 50 MG PO TABS
75.0000 mg | ORAL_TABLET | Freq: Three times a day (TID) | ORAL | Status: DC
  Administered 2017-12-18 – 2017-12-19 (×4): 75 mg via ORAL
  Filled 2017-12-18 (×4): qty 1

## 2017-12-18 MED ORDER — SODIUM POLYSTYRENE SULFONATE 15 GM/60ML PO SUSP: 15.0000 g | Freq: Once | ORAL | Status: DC

## 2017-12-18 MED ORDER — SODIUM BICARBONATE 8.4 % IV SOLN
50.0000 meq | Freq: Once | INTRAVENOUS | Status: AC
  Administered 2017-12-18: 50 meq via INTRAVENOUS
  Filled 2017-12-18: qty 50

## 2017-12-18 MED ORDER — SODIUM CHLORIDE 0.9 % IV SOLN: 100.0000 mL | INTRAVENOUS | Status: DC | PRN

## 2017-12-18 MED ORDER — DEXTROSE 50 % IV SOLN
1.0000 | Freq: Once | INTRAVENOUS | Status: AC
  Administered 2017-12-18: 50 mL via INTRAVENOUS
  Filled 2017-12-18: qty 50

## 2017-12-18 MED ORDER — SEVELAMER CARBONATE 800 MG PO TABS
1600.0000 mg | ORAL_TABLET | Freq: Every day | ORAL | Status: DC | PRN
Start: 2017-12-18 — End: 2017-12-19

## 2017-12-18 MED ORDER — ACETAMINOPHEN 650 MG RE SUPP: 650.0000 mg | Freq: Four times a day (QID) | RECTAL | Status: DC | PRN

## 2017-12-18 MED ORDER — SODIUM CHLORIDE 0.9 % IV SOLN
1.0000 g | Freq: Once | INTRAVENOUS | Status: AC
  Administered 2017-12-18: 1 g via INTRAVENOUS
  Filled 2017-12-18: qty 10

## 2017-12-18 MED ORDER — ONDANSETRON HCL 4 MG PO TABS: 4.0000 mg | ORAL_TABLET | Freq: Three times a day (TID) | ORAL | Status: DC | PRN

## 2017-12-18 MED ORDER — CHLORHEXIDINE GLUCONATE CLOTH 2 % EX PADS
6.0000 | MEDICATED_PAD | Freq: Every day | CUTANEOUS | Status: DC
  Administered 2017-12-18: 6 via TOPICAL

## 2017-12-18 MED ORDER — SODIUM CHLORIDE 0.9% FLUSH
3.0000 mL | Freq: Two times a day (BID) | INTRAVENOUS | Status: DC
  Administered 2017-12-18 – 2017-12-19 (×2): 3 mL via INTRAVENOUS

## 2017-12-18 MED ORDER — SEVELAMER CARBONATE 800 MG PO TABS
2400.0000 mg | ORAL_TABLET | Freq: Three times a day (TID) | ORAL | Status: DC
  Administered 2017-12-18 (×2): 2400 mg via ORAL
  Filled 2017-12-18 (×2): qty 3

## 2017-12-18 MED ORDER — CALCITRIOL 0.5 MCG PO CAPS
ORAL_CAPSULE | ORAL | Status: AC
  Filled 2017-12-18: qty 1

## 2017-12-18 NOTE — ED Provider Notes (Signed)
[facility] EMERGENCY DEPARTMENT Provider Note   CSN: [id number] Arrival date & time: 12/18/17  0253     History   Chief Complaint Chief Complaint  Patient presents with  . Shortness of Breath    HPI Valerie Yates is a 42 y.o. female.  The history is provided by the patient.  Shortness of Breath  This is a new problem. The average episode lasts 1 day. The problem occurs continuously.The current episode started yesterday. The problem has not changed since onset.Pertinent negatives include no fever, no chest pain, no leg pain and no leg swelling. Associated symptoms comments: Extremity pain, and feet feel "wet". The problem's precipitants include medical treatment. Risk factors: dialysis. She has tried nothing for the symptoms. The treatment provided no relief. She has had prior ED visits. She has had no prior ICU admissions. Associated medical issues comments: ESRD.    Past Medical History:  Diagnosis Date  . Anxiety   . Complication of anesthesia    woke up before they took the endotra. tube and tries to take out per patient  . ESRD (end stage renal disease) on dialysis ([place])    Mon, Wed, Fri. at [facility] on [address]  . GERD (gastroesophageal reflux disease)   . Headache    "after dialysis" reports that only when excessive fluid is removed at dialysis  . History of kidney infection   . Hyperkalemia 09/05/2017  . Hyperparathyroidism due to renal insufficiency ([place])   . Hypertension   . Iron deficiency anemia   . Migraine    "a few/year": (09/14/2015)  . Shortness of breath dyspnea    resolved per patient was due to fluid overload  . UTI (lower urinary tract infection)    recurrent    Patient Active Problem List   Diagnosis Date Noted  . Acute superficial gastritis without hemorrhage   . End-stage renal disease on hemodialysis ([place])   . Hyperkalemia   . Chest pain 09/04/2017  . Preop cardiovascular exam 04/11/2017  . Mitral valvular regurgitation  07/07/2016  . Congestive dilated cardiomyopathy ([place]): lilkely HTN related 07/07/2016  . Abnormal echocardiogram findings without diagnosis 02/25/2016  . Malnutrition of moderate degree 11/17/2015  . Nausea with vomiting 11/16/2015  . Poorly-controlled hypertension     Class: Stage 1  . ESRD on dialysis [facility])     Past Surgical History:  Procedure Laterality Date  . ABDOMINAL HYSTERECTOMY  2006  . AV FISTULA PLACEMENT Left 01/27/2015   Procedure:  BASILIC VEIN TRANSPOSITION ;  Surgeon: Angelia Mould, MD;  Location: [place];  Service: Vascular;  Laterality: Left;  . DILATION AND CURETTAGE OF UTERUS    . LAPAROSCOPIC CHOLECYSTECTOMY  2005  . NM MYOVIEW LTD  08/2014   No fixed or reversible perfusion defect - to suggest Infarct or Ischeima.. EF ~70%.    Marland Kitchen SHOULDER ARTHROSCOPY W/ ROTATOR CUFF REPAIR Left 2012  . SHOULDER ARTHROSCOPY WITH LABRAL REPAIR Right 08/11/2016   Procedure: RIGHT SHOULDER ARTHROSCOPY WITH LABRAL REPAIR;  Surgeon: Justice Britain, MD;  Location: [place];  Service: Orthopedics;  Laterality: Right;  . TRANSTHORACIC ECHOCARDIOGRAM  04/2014   Low normal EF of 50-55%. No RWMA.  Tr MR/TR.  NORMAL  . TRANSTHORACIC ECHOCARDIOGRAM  10/27/2015   [facility] (in setting of volume overload - leading to HD): Mildly impaired LV function with EF of 45-50%. Pseudo-normal LV filling (GR 2 DD. Moderate MR and moderate TR. RVSP estimated at 52 mmHg.  Marland Kitchen TRANSTHORACIC ECHOCARDIOGRAM  03/2017  Mild LVH.  Mildly reduced EF of 45-50%.  GRII DD.  Mild MR.  Moderate LA dilation.  Mild TR (improved from moderate MR and TR on last echo, suggestive of functional regurgitation due to LV dilation from volume overload  . TUBAL LIGATION  2003     OB History   None      Home Medications    Prior to Admission medications   Medication Sig Start Date End Date Taking? Authorizing Provider  ALPRAZolam Duanne Moron) 0.5 MG tablet Take 0.5 mg by mouth 3 (three) times daily.     [provider]  calcitRIOL (ROCALTROL) 0.25 MCG capsule Take 0.75 mcg by mouth daily. 08/08/17   [provider]  furosemide (LASIX) 20 MG tablet Take 2 tablets (40 mg total) by mouth daily. 12/12/17   Leonie Man, MD  hydrALAZINE (APRESOLINE) 25 MG tablet Take 3 tablets (75 mg total) by mouth 3 (three) times daily. 12/12/17   Leonie Man, MD  metoprolol succinate (TOPROL-XL) 100 MG 24 hr tablet Take 1 tablet (100 mg total) by mouth daily. Take with or immediately following a meal. 12/12/17   Leonie Man, MD  ondansetron (ZOFRAN) 4 MG tablet Take 1-2 tablets (4-8 mg total) by mouth every 8 (eight) hours as needed for nausea or vomiting. 12/08/16   Armbruster, Carlota Raspberry, MD  oxyCODONE-acetaminophen (PERCOCET) 5-325 MG tablet Take 1-2 tablets by mouth every 4 (four) hours as needed. 08/11/16   Shuford, Olivia Mackie, PA-C  RENVELA 800 MG tablet Take 1,600-2,400 mg by mouth 3 (three) times daily before meals. Takes 1600 mg with snacks. 2400 mg with meals 09/07/15   [provider]    Family History Family History  Problem Relation Age of Onset  . Diabetes Maternal Grandmother   . Cancer Mother        Breast  . Breast cancer Mother   . Heart disease Maternal Grandfather     Social History Social History   Tobacco Use  . Smoking status: Never Smoker  . Smokeless tobacco: Never Used  Substance Use Topics  . Alcohol use: No    Alcohol/week: 0.0 standard drinks  . Drug use: No     Allergies   Azithromycin; Erythromycin; Penicillins; Amlodipine; Valsartan; and Morphine sulfate   Review of Systems Review of Systems  Constitutional: Negative for fever.  Respiratory: Positive for shortness of breath.   Cardiovascular: Negative for chest pain, palpitations and leg swelling.  Musculoskeletal: Positive for arthralgias and myalgias.  All other systems reviewed and are negative.    Physical Exam Updated Vital Signs BP (!) 173/117   Pulse 93   Temp 99.3 F (37.4 C) (Oral)   Resp (!)  23   Ht 5\' 5"  (1.651 m)   Wt 52.6 kg   SpO2 99%   BMI 19.30 kg/m   Physical Exam  Constitutional: She is oriented to person, place, and time. She appears well-developed and well-nourished.  HENT:  Head: Normocephalic and atraumatic.  Mouth/Throat: No oropharyngeal exudate.  Eyes: Pupils are equal, round, and reactive to light. Conjunctivae are normal.  Neck: Normal range of motion. Neck supple.  Cardiovascular: Normal rate, regular rhythm, normal heart sounds and intact distal pulses.  Pulmonary/Chest: No stridor. She has wheezes. She has rales.  Abdominal: Soft. Bowel sounds are normal. She exhibits no mass. There is no tenderness. There is no rebound and no guarding.  Musculoskeletal: Normal range of motion. She exhibits no edema.  Neurological: She is alert and oriented to  person, place, and time. She displays normal reflexes.  Skin: Skin is warm and dry. Capillary refill takes less than 2 seconds. She is not diaphoretic.  Psychiatric: She has a normal mood and affect.     ED Treatments / Results  Labs (all labs ordered are listed, but only abnormal results are displayed) Results for orders placed or performed during the hospital encounter of 12/18/17  Lipase, blood  Result Value Ref Range   Lipase 45 11 - 51 U/L  Comprehensive metabolic panel  Result Value Ref Range   Sodium 138 135 - 145 mmol/L   Potassium 6.5 (HH) 3.5 - 5.1 mmol/L   Chloride 95 (L) 98 - 111 mmol/L   CO2 21 (L) 22 - 32 mmol/L   Glucose, Bld 82 70 - 99 mg/dL   BUN 53 (H) 6 - 20 mg/dL   Creatinine, Ser 9.04 (H) 0.44 - 1.00 mg/dL   Calcium 9.0 8.9 - 10.3 mg/dL   Total Protein 6.8 6.5 - 8.1 g/dL   Albumin 3.9 3.5 - 5.0 g/dL   AST 41 15 - 41 U/L   ALT 26 0 - 44 U/L   Alkaline Phosphatase 109 38 - 126 U/L   Total Bilirubin 1.5 (H) 0.3 - 1.2 mg/dL   GFR calc non Af Amer 5 (L) >60 mL/min   GFR calc Af Amer 6 (L) >60 mL/min   Anion gap 22 (H) 5 - 15  CBC  Result Value Ref Range   WBC 7.2 4.0 - 10.5  K/uL   RBC 3.63 (L) 3.87 - 5.11 MIL/uL   Hemoglobin 11.2 (L) 12.0 - 15.0 g/dL   HCT 36.4 36.0 - 46.0 %   MCV 100.3 (H) 78.0 - 100.0 fL   MCH 30.9 26.0 - 34.0 pg   MCHC 30.8 30.0 - 36.0 g/dL   RDW 14.8 11.5 - 15.5 %   Platelets 233 150 - 400 K/uL  I-stat troponin, ED  Result Value Ref Range   Troponin i, poc 0.04 0.00 - 0.08 ng/mL   Comment 3          CBG monitoring, ED  Result Value Ref Range   Glucose-Capillary 83 70 - 99 mg/dL   Dg Chest 2 View  Result Date: 12/18/2017 CLINICAL DATA:  Shortness of breath and pain down both arms since yesterday afternoon. EXAM: CHEST - 2 VIEW COMPARISON:  09/04/2017 FINDINGS: Cardiac enlargement. Pulmonary vascularity is normal. Fine interstitial pattern to the lung bases is more prominent than previous study and could indicate mild interstitial edema or interstitial pneumonitis. No focal consolidation. No blunting of costophrenic angles. No pneumothorax. Mediastinal contours appear intact. Surgical clips in the right upper quadrant. IMPRESSION: Cardiac enlargement. Increasing interstitial pattern to the lung bases may represent interstitial edema or interstitial pneumonitis. Electronically Signed   By: Lucienne Capers M.D.   On: 12/18/2017 04:21    EKG EKG Interpretation  Date/Time:  Monday December 18 2017 04:04:37 EDT Ventricular Rate:  93 PR Interval:    QRS Duration: 97 QT Interval:  414 QTC Calculation: 515 R Axis:   62 Text Interpretation:  Sinus rhythm Probable left ventricular hypertrophy Prolonged QT interval Confirmed by Dory Horn) on 12/18/2017 4:07:04 AM   Radiology Dg Chest 2 View  Result Date: 12/18/2017 CLINICAL DATA:  Shortness of breath and pain down both arms since yesterday afternoon. EXAM: CHEST - 2 VIEW COMPARISON:  09/04/2017 FINDINGS: Cardiac enlargement. Pulmonary vascularity is normal. Fine interstitial pattern to the lung bases is more prominent  than previous study and could indicate mild interstitial edema  or interstitial pneumonitis. No focal consolidation. No blunting of costophrenic angles. No pneumothorax. Mediastinal contours appear intact. Surgical clips in the right upper quadrant. IMPRESSION: Cardiac enlargement. Increasing interstitial pattern to the lung bases may represent interstitial edema or interstitial pneumonitis. Electronically Signed   By: Lucienne Capers M.D.   On: 12/18/2017 04:21    Procedures Procedures (including critical care time)  Medications Ordered in ED Medications  albuterol (PROVENTIL) (2.5 MG/3ML) 0.083% nebulizer solution 10 mg (has no administration in time range)  insulin aspart (novoLOG) injection 10 Units (has no administration in time range)  dextrose 50 % solution 50 mL (has no administration in time range)  sodium bicarbonate injection 50 mEq (has no administration in time range)  calcium gluconate 1 g in sodium chloride 0.9 % 100 mL IVPB (has no administration in time range)  furosemide (LASIX) injection 40 mg (has no administration in time range)  Chlorhexidine Gluconate Cloth 2 % PADS 6 each (has no administration in time range)   MDM Reviewed: nursing note and vitals Interpretation: labs, ECG and x-ray (hyperkalemia, pulmonary edema by me on CXR) Total time providing critical care: 30-74 minutes (treatment of hyperkalemia). This excludes time spent performing separately reportable procedures and services. Consults: admitting MD (nephrology )  CRITICAL CARE Performed by: Carlisle Beers Total critical care time: 60 minutes Critical care time was exclusive of separately billable procedures and treating other patients. Critical care was necessary to treat or prevent imminent or life-threatening deterioration. Critical care was time spent personally by me on the following activities: development of treatment plan with patient and/or surrogate as well as nursing, discussions with consultants, evaluation of patient's response to treatment,  examination of patient, obtaining history from patient or surrogate, ordering and performing treatments and interventions, ordering and review of laboratory studies, ordering and review of radiographic studies, pulse oximetry and re-evaluation of patient's condition.   Case d/w Dr. Jonnie Finner  Final Clinical Impressions(s) / ED Diagnoses   Admit to medicine for dialysis    Gloris Shiroma, MD 12/18/17 [id number]

## 2017-12-18 NOTE — Progress Notes (Addendum)
Patient arrived from [facility]. A&O x 4. SR/ST on telemetry. No skin break down. Not high fall risk. VSS consistant with ED vitals. BP elevated. Waiting for dialysis pick up. Left arm fistula (posiitive pulses and sounds). CHG bath given. Patient complains of back pain 7 of 10. No pain meds given due to pending dialysis. Patient oriented to room and call system.

## 2017-12-18 NOTE — ED Triage Notes (Signed)
The pt is c/osob cramps in her arms and her body is supposed to have dialysis this am at 0500 in [place]    lmp none.  hyperventilating

## 2017-12-18 NOTE — Progress Notes (Deleted)
Tol HD.  K 6.5.  Will need fistulogram for rising creatinine and hyperkalemia. Erling Cruz, MD

## 2017-12-18 NOTE — Progress Notes (Signed)
Patient admitted after midnight, please see H&P.  Here with elevated K and SOB.  Currently in HD-- plan for fistulagram by nephrology as patient is compliant with diet/HD sessions.  Eulogio Bear

## 2017-12-18 NOTE — ED Notes (Signed)
Fistula lt arm

## 2017-12-18 NOTE — H&P (Addendum)
TRH H&P   Patient Demographics:    Valerie Yates, is a 42 y.o. female  MRN: [id number]   DOB - 25-Feb-1976  Admit Date - 12/18/2017  Outpatient Primary MD for the patient is Enid Skeens., MD  Referring MD/NP/PA: April Palumbo  Outpatient Specialists:  Glenetta Hew  Patient coming from: home  Chief Complaint  Patient presents with  . Shortness of Breath      HPI:    Valerie Yates  is a 42 y.o. female, w ESRD on HD (M, W, F) , h/o hyperkalemia, Iron def anemia, who presents with c/o dyspnea and cramping. Pt denies fever, chills, cough, cp, palp, orthopnea, pnd, lower ext edema. Pt had full dialysis on Friday.   In Ed,  CXR IMPRESSION: Cardiac enlargement. Increasing interstitial pattern to the lung bases may represent interstitial edema or interstitial pneumonitis.  Lipase 45  Na 138, K 6.5, Bun 53, Creatinine 9.04 Ast 41, Alt 26 Wbc 7.2, Hgb 11.2, Plt 233 Trop 0.04  Pt will be admitted for hyperkalemia in setting of ESRD     Review of systems:    In addition to the HPI above, No Fever-chills, No Headache, No changes with Vision or hearing, No problems swallowing food or Liquids, No Chest pain, Cough  No Abdominal pain, No Nausea or Vommitting, Bowel movements are regular, No Blood in stool or Urine, No dysuria, No new skin rashes or bruises, No new joints pains-aches,  No new weakness, tingling, numbness in any extremity, No recent weight gain or loss, No polyuria, polydypsia or polyphagia, No significant Mental Stressors.  A full 10 point Review of Systems was done, except as stated above, all other Review of Systems were negative.   With Past History of the following :    Past Medical History:  Diagnosis Date  . Anxiety   . Complication of anesthesia    woke up before they took the endotra. tube and tries to take out per patient  . ESRD (end  stage renal disease) on dialysis ([place])    Mon, Wed, Fri. at [facility] on [address]  . GERD (gastroesophageal reflux disease)   . Headache    "after dialysis" reports that only when excessive fluid is removed at dialysis  . History of kidney infection   . Hyperkalemia 09/05/2017, 12/18/2017  . Hyperparathyroidism due to renal insufficiency ([place])   . Hypertension   . Iron deficiency anemia   . Migraine    "a few/year": (09/14/2015)  . Shortness of breath dyspnea    resolved per patient was due to fluid overload  . UTI (lower urinary tract infection)    recurrent      Past Surgical History:  Procedure Laterality Date  . ABDOMINAL HYSTERECTOMY  2006  . AV FISTULA PLACEMENT Left 01/27/2015   Procedure:  BASILIC VEIN TRANSPOSITION ;  Surgeon: Angelia Mould, MD;  Location: [place];  Service: Vascular;  Laterality: Left;  . DILATION AND CURETTAGE OF UTERUS    . LAPAROSCOPIC CHOLECYSTECTOMY  2005  . NM MYOVIEW LTD  08/2014   No fixed or reversible perfusion defect - to suggest Infarct or Ischeima.. EF ~70%.    Marland Kitchen SHOULDER ARTHROSCOPY W/ ROTATOR CUFF REPAIR Left 2012  . SHOULDER ARTHROSCOPY WITH LABRAL REPAIR Right 08/11/2016   Procedure: RIGHT SHOULDER ARTHROSCOPY WITH LABRAL REPAIR;  Surgeon: Justice Britain, MD;  Location: [place];  Service: Orthopedics;  Laterality: Right;  . TRANSTHORACIC ECHOCARDIOGRAM  04/2014   Low normal EF of 50-55%. No RWMA.  Tr MR/TR.  NORMAL  . TRANSTHORACIC ECHOCARDIOGRAM  10/27/2015   [facility] (in setting of volume overload - leading to HD): Mildly impaired LV function with EF of 45-50%. Pseudo-normal LV filling (GR 2 DD. Moderate MR and moderate TR. RVSP estimated at 52 mmHg.  Marland Kitchen TRANSTHORACIC ECHOCARDIOGRAM  03/2017   Mild LVH.  Mildly reduced EF of 45-50%.  GRII DD.  Mild MR.  Moderate LA dilation.  Mild TR (improved from moderate MR and TR on last echo, suggestive of functional regurgitation due to LV dilation from volume overload  . TUBAL LIGATION   2003      Social History:     Social History   Tobacco Use  . Smoking status: Never Smoker  . Smokeless tobacco: Never Used  Substance Use Topics  . Alcohol use: No    Alcohol/week: 0.0 standard drinks     Lives - at home  Mobility - walks by self   Family History :     Family History  Problem Relation Age of Onset  . Diabetes Maternal Grandmother   . Cancer Mother        Breast  . Breast cancer Mother   . Heart disease Maternal Grandfather       Home Medications:   Prior to Admission medications   Medication Sig Start Date End Date Taking? Authorizing Provider  ALPRAZolam Duanne Moron) 0.5 MG tablet Take 0.5 mg by mouth 3 (three) times daily.    Yes [provider]  furosemide (LASIX) 20 MG tablet Take 2 tablets (40 mg total) by mouth daily. 12/12/17  Yes Leonie Man, MD  hydrALAZINE (APRESOLINE) 25 MG tablet Take 3 tablets (75 mg total) by mouth 3 (three) times daily. 12/12/17  Yes Leonie Man, MD  metoprolol succinate (TOPROL-XL) 100 MG 24 hr tablet Take 1 tablet (100 mg total) by mouth daily. Take with or immediately following a meal. 12/12/17  Yes Leonie Man, MD  oxyCODONE-acetaminophen (PERCOCET) 5-325 MG tablet Take 1-2 tablets by mouth every 4 (four) hours as needed. 08/11/16  Yes Shuford, Olivia Mackie, PA-C  calcitRIOL (ROCALTROL) 0.25 MCG capsule Take 0.5 mcg by mouth daily.  08/08/17   [provider]  ondansetron (ZOFRAN) 4 MG tablet Take 1-2 tablets (4-8 mg total) by mouth every 8 (eight) hours as needed for nausea or vomiting. 12/08/16   Armbruster, Carlota Raspberry, MD  RENVELA 800 MG tablet Take 1,600-2,400 mg by mouth 3 (three) times daily before meals. Takes 1600 mg with snacks. 2400 mg with meals 09/07/15   [provider]     Allergies:     Allergies  Allergen Reactions  . Azithromycin Anaphylaxis and Rash  . Erythromycin Hives, Shortness Of Breath and Rash    Acne Aid-Dermatological. Acne Aid-Dermatological.  . Penicillins  Anaphylaxis, Hives and Shortness Of Breath    Has patient had a PCN reaction causing immediate rash,  facial/tongue/throat swelling, SOB or lightheadedness with hypotension: #  #  #  YES  #  #  #  Has patient had a PCN reaction causing SEVERE RASH INVOLVING MUCUS MEMBRANES or SKIN NECROSIS: #  #  #  YES  #  #  #  Has patient had a PCN reaction that required hospitalization #  #  #  YES  #  #  #  Has patient had a PCN reaction occurring within the last 10 years:  #  #  #  NO  #  #  #   Has patient had a PCN reaction causing immediate rash, facial/tongue/throat swelling, SOB or lightheadedness with hypotension: ###YES###  Has patient had a PCN reaction causing SEVERE RASH INVOLVING MUCUS MEMBRANES or SKIN NECROSIS: ###YES###  Has patient had a PCN reaction that required hospitalization ###YES###  Has patient had a PCN reaction occurring within the last 10 years:###NO###   . Amlodipine   . Valsartan   . Morphine Sulfate Rash    Tolerates with Benadryl  Tolerates with Benadryl      Physical Exam:   Vitals  Blood pressure (!) 173/117, pulse 93, temperature 99.3 F (37.4 C), temperature source Oral, resp. rate (!) 23, height 5\' 5"  (1.651 m), weight 52.6 kg, SpO2 99 %.   1. General lying in bed in NAD,    2. Normal affect and insight, Not Suicidal or Homicidal, Awake Alert, Oriented X 3.  3. No F.N deficits, ALL C.Nerves Intact, Strength 5/5 all 4 extremities, Sensation intact all 4 extremities, Plantars down going.  4. Ears and Eyes appear Normal, Conjunctivae clear, PERRLA. Moist Oral Mucosa.  5. Supple Neck, No JVD, No cervical lymphadenopathy appriciated, No Carotid Bruits.  6. Symmetrical Chest wall movement, Good air movement bilaterally, CTAB.  7. RRR, No Gallops, Rubs or Murmurs, No Parasternal Heave.  8. Positive Bowel Sounds, Abdomen Soft, No tenderness, No organomegaly appriciated,No rebound -guarding or rigidity.  9.  No  Cyanosis, Normal Skin Turgor, No Skin Rash or Bruise.  10. Good muscle tone,  joints appear normal , no effusions, Normal ROM.  11. No Palpable Lymph Nodes in Neck or Axillae      Data Review:    CBC Recent Labs  Lab 12/18/17 0325  WBC 7.2  HGB 11.2*  HCT 36.4  PLT 233  MCV 100.3*  MCH 30.9  MCHC 30.8  RDW 14.8   ------------------------------------------------------------------------------------------------------------------  Chemistries  Recent Labs  Lab 12/18/17 0325  NA 138  K 6.5*  CL 95*  CO2 21*  GLUCOSE 82  BUN 53*  CREATININE 9.04*  CALCIUM 9.0  AST 41  ALT 26  ALKPHOS 109  BILITOT 1.5*   ------------------------------------------------------------------------------------------------------------------ estimated creatinine clearance is 6.7 mL/min (A) (by C-G formula based on SCr of 9.04 mg/dL (H)). ------------------------------------------------------------------------------------------------------------------ No results for input(s): TSH, T4TOTAL, T3FREE, THYROIDAB in the last 72 hours.  Invalid input(s): FREET3  Coagulation profile No results for input(s): INR, PROTIME in the last 168 hours. ------------------------------------------------------------------------------------------------------------------- No results for input(s): DDIMER in the last 72 hours. -------------------------------------------------------------------------------------------------------------------  Cardiac Enzymes No results for input(s): CKMB, TROPONINI, MYOGLOBIN in the last 168 hours.  Invalid input(s): CK ------------------------------------------------------------------------------------------------------------------    Component Value Date/Time   BNP >4,500.0 (H) 05/15/2016 2215     ---------------------------------------------------------------------------------------------------------------  Urinalysis    Component Value Date/Time   COLORURINE YELLOW  09/14/2015 0630   APPEARANCEUR CLOUDY (A) 09/14/2015 0630   APPEARANCEUR Clear 08/20/2013 2325   LABSPEC 1.009 09/14/2015 0630   LABSPEC  1.009 08/20/2013 2325   PHURINE 8.5 (H) 09/14/2015 0630   GLUCOSEU 100 (A) 09/14/2015 0630   GLUCOSEU Negative 08/20/2013 2325   HGBUR SMALL (A) 09/14/2015 0630   BILIRUBINUR NEGATIVE 09/14/2015 0630   BILIRUBINUR Negative 08/20/2013 2325   KETONESUR NEGATIVE 09/14/2015 0630   PROTEINUR 100 (A) 09/14/2015 0630   UROBILINOGEN 1.0 02/16/2010 1945   NITRITE NEGATIVE 09/14/2015 0630   LEUKOCYTESUR NEGATIVE 09/14/2015 0630   LEUKOCYTESUR 1+ 08/20/2013 2325    ----------------------------------------------------------------------------------------------------------------   Imaging Results:    Dg Chest 2 View  Result Date: 12/18/2017 CLINICAL DATA:  Shortness of breath and pain down both arms since yesterday afternoon. EXAM: CHEST - 2 VIEW COMPARISON:  09/04/2017 FINDINGS: Cardiac enlargement. Pulmonary vascularity is normal. Fine interstitial pattern to the lung bases is more prominent than previous study and could indicate mild interstitial edema or interstitial pneumonitis. No focal consolidation. No blunting of costophrenic angles. No pneumothorax. Mediastinal contours appear intact. Surgical clips in the right upper quadrant. IMPRESSION: Cardiac enlargement. Increasing interstitial pattern to the lung bases may represent interstitial edema or interstitial pneumonitis. Electronically Signed   By: Lucienne Capers M.D.   On: 12/18/2017 04:21    ekg nsr at 4, nl axis,  Poor R progression   Assessment & Plan:    Principal Problem:   Hyperkalemia Active Problems:   ESRD on dialysis (HCC)    Hyperkalemia Calcium gluconate Sodium bicarbonate Kayexalate HD today, nephrology consulted by Ed, appreciate input  ESRD on HD M, W, F Cont Renvela  Cont Rocaltrol 0.44micrograms po qday Cont dialysis as above  Hypertension Cont hydralazine 75mg  po  tid Cont Toprol XL 100mg  po qday  DVT Prophylaxis Heparin  - SCDs  AM Labs Ordered, also please review Full Orders  Family Communication: Admission, patients condition and plan of care including tests being ordered have been discussed with the patient  who indicate understanding and agree with the plan and Code Status.  Code Status  FULL CODE  Likely DC to  home  Condition GUARDED    Consults called: nephrology by ED  Admission status: observation  Time spent in minutes : 60   Jani Gravel M.D on 12/18/2017 at 5:11 AM  Between 7am to 7pm - Pager - [phone]   After 7pm go to [email or web address] - password [facility]  [facility] - Office  [phone]

## 2017-12-18 NOTE — Procedures (Signed)
Tol HD.  K 6.5.  Will need fistulogram for rising creatinine and hyperkalemia. Erling Cruz, MD

## 2017-12-18 NOTE — Consult Note (Signed)
Chief Complaint: Patient was seen in consultation today for left arm dialysis graft shuntogram with possible intervention Chief Complaint  Patient presents with  . Shortness of Breath   at the request of Dr Hulen Luster  Supervising Physician: Corrie Mckusick  Patient Status: [facility] - In-pt  History of Present Illness: Valerie Yates is a 42 y.o. female   Left arm dialysis graft with possible recirculation issue  Dr Florene Glen note today She has a rising potassium and creatinine suggestive of inadequate dialysis probably due to recirculation in the AV access due to venous outflow stenosis.  She has also had prolonged bleeding over the last month post dialysis.  Will ask IR to evaluate for stenosis.  Now scheduled for fistulogram with possible intervention    Past Medical History:  Diagnosis Date  . Anxiety   . Complication of anesthesia    woke up before they took the endotra. tube and tries to take out per patient  . ESRD (end stage renal disease) on dialysis ([place])    Mon, Wed, Fri. at [facility] on [address]  . GERD (gastroesophageal reflux disease)   . Headache    "after dialysis" reports that only when excessive fluid is removed at dialysis  . History of kidney infection   . Hyperkalemia 09/05/2017, 12/18/2017  . Hyperparathyroidism due to renal insufficiency ([place])   . Hypertension   . Iron deficiency anemia   . Migraine    "a few/year": (09/14/2015)  . Shortness of breath dyspnea    resolved per patient was due to fluid overload  . UTI (lower urinary tract infection)    recurrent    Past Surgical History:  Procedure Laterality Date  . ABDOMINAL HYSTERECTOMY  2006  . AV FISTULA PLACEMENT Left 01/27/2015   Procedure:  BASILIC VEIN TRANSPOSITION ;  Surgeon: Angelia Mould, MD;  Location: [place];  Service: Vascular;  Laterality: Left;  . DILATION AND CURETTAGE OF UTERUS    . LAPAROSCOPIC CHOLECYSTECTOMY  2005  . NM MYOVIEW LTD  08/2014   No fixed or reversible  perfusion defect - to suggest Infarct or Ischeima.. EF ~70%.    Marland Kitchen SHOULDER ARTHROSCOPY W/ ROTATOR CUFF REPAIR Left 2012  . SHOULDER ARTHROSCOPY WITH LABRAL REPAIR Right 08/11/2016   Procedure: RIGHT SHOULDER ARTHROSCOPY WITH LABRAL REPAIR;  Surgeon: Justice Britain, MD;  Location: [place];  Service: Orthopedics;  Laterality: Right;  . TRANSTHORACIC ECHOCARDIOGRAM  04/2014   Low normal EF of 50-55%. No RWMA.  Tr MR/TR.  NORMAL  . TRANSTHORACIC ECHOCARDIOGRAM  10/27/2015   [facility] (in setting of volume overload - leading to HD): Mildly impaired LV function with EF of 45-50%. Pseudo-normal LV filling (GR 2 DD. Moderate MR and moderate TR. RVSP estimated at 52 mmHg.  Marland Kitchen TRANSTHORACIC ECHOCARDIOGRAM  03/2017   Mild LVH.  Mildly reduced EF of 45-50%.  GRII DD.  Mild MR.  Moderate LA dilation.  Mild TR (improved from moderate MR and TR on last echo, suggestive of functional regurgitation due to LV dilation from volume overload  . TUBAL LIGATION  2003    Allergies: Azithromycin; Erythromycin; Penicillins; Amlodipine; Valsartan; and Morphine sulfate  Medications: Prior to Admission medications   Medication Sig Start Date End Date Taking? Authorizing Provider  ALPRAZolam Duanne Moron) 0.5 MG tablet Take 0.5 mg by mouth 3 (three) times daily.    Yes [provider]  calcitRIOL (ROCALTROL) 0.25 MCG capsule Take 0.5 mcg by mouth daily.  08/08/17  Yes [provider]  furosemide (LASIX)  20 MG tablet Take 2 tablets (40 mg total) by mouth daily. 12/12/17  Yes Leonie Man, MD  hydrALAZINE (APRESOLINE) 25 MG tablet Take 3 tablets (75 mg total) by mouth 3 (three) times daily. 12/12/17  Yes Leonie Man, MD  metoprolol succinate (TOPROL-XL) 100 MG 24 hr tablet Take 1 tablet (100 mg total) by mouth daily. Take with or immediately following a meal. 12/12/17  Yes Leonie Man, MD  ondansetron (ZOFRAN) 4 MG tablet Take 1-2 tablets (4-8 mg total) by mouth every 8 (eight) hours as needed for nausea  or vomiting. 12/08/16  Yes Armbruster, Carlota Raspberry, MD  oxyCODONE-acetaminophen (PERCOCET) 5-325 MG tablet Take 1-2 tablets by mouth every 4 (four) hours as needed. 08/11/16  Yes Shuford, Olivia Mackie, PA-C  RENVELA 800 MG tablet Take 1,600-2,400 mg by mouth See admin instructions. Take 4 tablets with meals then take 2 tablets with snacks 09/07/15  Yes [provider]     Family History  Problem Relation Age of Onset  . Diabetes Maternal Grandmother   . Cancer Mother        Breast  . Breast cancer Mother   . Heart disease Maternal Grandfather     Social History   Socioeconomic History  . Marital status: Married    Spouse name: Not on file  . Number of children: 1  . Years of education: Not on file  . Highest education level: Not on file  Occupational History    Comment: Disabled  Social Needs  . Financial resource strain: Not on file  . Food insecurity:    Worry: Not on file    Inability: Not on file  . Transportation needs:    Medical: Not on file    Non-medical: Not on file  Tobacco Use  . Smoking status: Never Smoker  . Smokeless tobacco: Never Used  Substance and Sexual Activity  . Alcohol use: No    Alcohol/week: 0.0 standard drinks  . Drug use: No  . Sexual activity: Yes    Birth control/protection: Surgical  Lifestyle  . Physical activity:    Days per week: Not on file    Minutes per session: Not on file  . Stress: Not on file  Relationships  . Social connections:    Talks on phone: Not on file    Gets together: Not on file    Attends religious service: Not on file    Active member of club or organization: Not on file    Attends meetings of clubs or organizations: Not on file    Relationship status: Not on file  Other Topics Concern  . Not on file  Social History Narrative  . Not on file     Review of Systems: A 12 point ROS discussed and pertinent positives are indicated in the HPI above.  All other systems are negative.   Vital Signs: BP (!) 163/86  (BP Location: Right Arm)   Pulse 86   Temp 98.7 F (37.1 C) (Oral)   Resp 17   Ht 5\' 4"  (1.626 m)   Wt 111 lb 1.8 oz (50.4 kg)   SpO2 99%   BMI 19.07 kg/m   Physical Exam  Constitutional: She is oriented to person, place, and time.  Cardiovascular: Normal rate and regular rhythm.  Pulmonary/Chest: Effort normal and breath sounds normal.  Musculoskeletal: Normal range of motion.  Left arm dialysis graft ++ thrill ++ pulse  Neurological: She is alert and oriented to person, place, and time.  Skin: Skin is warm and dry.  Psychiatric: She has a normal mood and affect. Her behavior is normal.  Nursing note and vitals reviewed.   Imaging: Dg Chest 2 View  Result Date: 12/18/2017 CLINICAL DATA:  Shortness of breath and pain down both arms since yesterday afternoon. EXAM: CHEST - 2 VIEW COMPARISON:  09/04/2017 FINDINGS: Cardiac enlargement. Pulmonary vascularity is normal. Fine interstitial pattern to the lung bases is more prominent than previous study and could indicate mild interstitial edema or interstitial pneumonitis. No focal consolidation. No blunting of costophrenic angles. No pneumothorax. Mediastinal contours appear intact. Surgical clips in the right upper quadrant. IMPRESSION: Cardiac enlargement. Increasing interstitial pattern to the lung bases may represent interstitial edema or interstitial pneumonitis. Electronically Signed   By: Lucienne Capers M.D.   On: 12/18/2017 04:21    Labs:  CBC: Recent Labs    09/04/17 0358 12/18/17 0325  WBC 7.9 7.2  HGB 10.4* 11.2*  HCT 32.5* 36.4  PLT 255 233    COAGS: No results for input(s): INR, APTT in the last 8760 hours.  BMP: Recent Labs    09/05/17 0719 09/05/17 2058 09/06/17 0417 12/18/17 0325  NA 138 136 138 138  K 5.8* 5.6* 5.9* 6.5*  CL 94* 96* 97* 95*  CO2 31 28 31  21*  GLUCOSE 92 89 91 82  BUN 24* 33* 35* 53*  CALCIUM 8.1* 8.5* 8.7* 9.0  CREATININE 4.71* 5.91* 6.51* 9.04*  GFRNONAA 11* 8* 7* 5*  GFRAA  12* 9* 8* 6*    LIVER FUNCTION TESTS: Recent Labs    09/04/17 1139 09/05/17 0719 09/05/17 2058 09/06/17 0417 12/18/17 0325  BILITOT 1.1  --   --   --  1.5*  AST 31  --   --   --  41  ALT 40  --   --   --  26  ALKPHOS 133*  --   --   --  109  PROT 6.1*  --   --   --  6.8  ALBUMIN 3.5 3.2* 3.3* 3.3* 3.9    TUMOR MARKERS: No results for input(s): AFPTM, CEA, CA199, CHROMGRNA in the last 8760 hours.  Assessment and Plan:  Pt with continued rising potassium and Creatinine-- possible recirculation issue Now for shuntogram in IR with possible intervention Pt is aware of procedure benefits and risks including but not limited to Infection; bleeding; vessel damage; damage to surrounding structures Agreeable to proceed Consent signed and in chart  Thank you for this interesting consult.  I greatly enjoyed meeting Solimar Maiden and look forward to participating in their care.  A copy of this report was sent to the requesting provider on this date.  Electronically Signed: Lavonia Drafts, PA-C 12/18/2017, 1:58 PM   I spent a total of 20 Minutes    in face to face in clinical consultation, greater than 50% of which was counseling/coordinating care for left arm dialysis graft shuntogram

## 2017-12-18 NOTE — Consult Note (Signed)
[facility] Renal Consultation Note    Indication for Consultation:  Management of ESRD/hemodialysis; anemia, hypertension/volume and secondary hyperparathyroidism PCP: Dr. Raliegh Scarlet Cardiologist: Dr. Ellyn Hack Nephrologist: Dr. Augustin Coupe  HPI: Valerie Yates is a 42 y.o. female with ESRD on hemodialysis MWF at [facility]. PMH for hypertension, hyperkalemia, anxiety, migraines, UTIs, SHPT AOCD. Patient is very compliant with hemodialysis, does not miss treatments. Last HD 12/15/2017, left 0.3 kg under OP EDW.   Patient presented to ED this AM with C/O SOB and abdominal pain. Upon arrival to ED, K+ was noted to be 6.5, Scr 9.04. Labs otherwise unremarkable. CXR revealed cardiac enlargement with increasing interstitial pattern possibly representing interstitial edema or pneumonitis. She rec'd calcium gluconate and NAHCO IV in ED and is currently on dialysis for hyperkalemia. Of note, she had similar admission 04/29-05/05/2017 for hyperkalemia. Upon chart review, it is noted that serum creatinine or K+ actually was actually never more than marginally lowered after hemodialysis. There is concern for recirculation in AVF and fistulogram has been ordered. She currently has no complaints, endorses compliance with HD and low K+ diet. Last K+ at HD center was 4.7 11/29/2017.   Past Medical History:  Diagnosis Date  . Anxiety   . Complication of anesthesia    woke up before they took the endotra. tube and tries to take out per patient  . ESRD (end stage renal disease) on dialysis ([place])    Mon, Wed, Fri. at [facility] on [address]  . GERD (gastroesophageal reflux disease)   . Headache    "after dialysis" reports that only when excessive fluid is removed at dialysis  . History of kidney infection   . Hyperkalemia 09/05/2017, 12/18/2017  . Hyperparathyroidism due to renal insufficiency ([place])   . Hypertension   . Iron deficiency anemia   . Migraine    "a few/year": (09/14/2015)  .  Shortness of breath dyspnea    resolved per patient was due to fluid overload  . UTI (lower urinary tract infection)    recurrent   Past Surgical History:  Procedure Laterality Date  . ABDOMINAL HYSTERECTOMY  2006  . AV FISTULA PLACEMENT Left 01/27/2015   Procedure:  BASILIC VEIN TRANSPOSITION ;  Surgeon: Angelia Mould, MD;  Location: [place];  Service: Vascular;  Laterality: Left;  . DILATION AND CURETTAGE OF UTERUS    . LAPAROSCOPIC CHOLECYSTECTOMY  2005  . NM MYOVIEW LTD  08/2014   No fixed or reversible perfusion defect - to suggest Infarct or Ischeima.. EF ~70%.    Marland Kitchen SHOULDER ARTHROSCOPY W/ ROTATOR CUFF REPAIR Left 2012  . SHOULDER ARTHROSCOPY WITH LABRAL REPAIR Right 08/11/2016   Procedure: RIGHT SHOULDER ARTHROSCOPY WITH LABRAL REPAIR;  Surgeon: Justice Britain, MD;  Location: [place];  Service: Orthopedics;  Laterality: Right;  . TRANSTHORACIC ECHOCARDIOGRAM  04/2014   Low normal EF of 50-55%. No RWMA.  Tr MR/TR.  NORMAL  . TRANSTHORACIC ECHOCARDIOGRAM  10/27/2015   [facility] (in setting of volume overload - leading to HD): Mildly impaired LV function with EF of 45-50%. Pseudo-normal LV filling (GR 2 DD. Moderate MR and moderate TR. RVSP estimated at 52 mmHg.  Marland Kitchen TRANSTHORACIC ECHOCARDIOGRAM  03/2017   Mild LVH.  Mildly reduced EF of 45-50%.  GRII DD.  Mild MR.  Moderate LA dilation.  Mild TR (improved from moderate MR and TR on last echo, suggestive of functional regurgitation due to LV dilation from volume overload  . TUBAL LIGATION  2003   Family History  Problem Relation Age of Onset  . Diabetes Maternal Grandmother   . Cancer Mother        Breast  . Breast cancer Mother   . Heart disease Maternal Grandfather    Social History:  reports that she has never smoked. She has never used smokeless tobacco. She reports that she does not drink alcohol or use drugs. Allergies  Allergen Reactions  . Azithromycin Anaphylaxis and Rash  . Erythromycin Hives, Shortness Of  Breath and Rash    Acne Aid-Dermatological. Acne Aid-Dermatological.  . Penicillins Anaphylaxis, Hives and Shortness Of Breath    Has patient had a PCN reaction causing immediate rash, facial/tongue/throat swelling, SOB or lightheadedness with hypotension: #  #  #  YES  #  #  #  Has patient had a PCN reaction causing SEVERE RASH INVOLVING MUCUS MEMBRANES or SKIN NECROSIS: #  #  #  YES  #  #  #  Has patient had a PCN reaction that required hospitalization #  #  #  YES  #  #  #  Has patient had a PCN reaction occurring within the last 10 years:  #  #  #  NO  #  #  #   Has patient had a PCN reaction causing immediate rash, facial/tongue/throat swelling, SOB or lightheadedness with hypotension: ###YES###  Has patient had a PCN reaction causing SEVERE RASH INVOLVING MUCUS MEMBRANES or SKIN NECROSIS: ###YES###  Has patient had a PCN reaction that required hospitalization ###YES###  Has patient had a PCN reaction occurring within the last 10 years:###NO###   . Amlodipine   . Valsartan   . Morphine Sulfate Rash    Tolerates with Benadryl  Tolerates with Benadryl    Prior to Admission medications   Medication Sig Start Date End Date Taking? Authorizing Provider  ALPRAZolam Duanne Moron) 0.5 MG tablet Take 0.5 mg by mouth 3 (three) times daily.    Yes [provider]  calcitRIOL (ROCALTROL) 0.25 MCG capsule Take 0.5 mcg by mouth daily.  08/08/17  Yes [provider]  furosemide (LASIX) 20 MG tablet Take 2 tablets (40 mg total) by mouth daily. 12/12/17  Yes Leonie Man, MD  hydrALAZINE (APRESOLINE) 25 MG tablet Take 3 tablets (75 mg total) by mouth 3 (three) times daily. 12/12/17  Yes Leonie Man, MD  metoprolol succinate (TOPROL-XL) 100 MG 24 hr tablet Take 1 tablet (100 mg total) by mouth daily. Take with or immediately following a meal. 12/12/17  Yes Leonie Man, MD  ondansetron (ZOFRAN) 4 MG tablet Take 1-2 tablets (4-8 mg total)  by mouth every 8 (eight) hours as needed for nausea or vomiting. 12/08/16  Yes Armbruster, Carlota Raspberry, MD  oxyCODONE-acetaminophen (PERCOCET) 5-325 MG tablet Take 1-2 tablets by mouth every 4 (four) hours as needed. 08/11/16  Yes Shuford, Olivia Mackie, PA-C  RENVELA 800 MG tablet Take 1,600-2,400 mg by mouth See admin instructions. Take 4 tablets with meals then take 2 tablets with snacks 09/07/15  Yes [provider]   Current Facility-Administered Medications  Medication Dose Route Frequency Provider Last Rate Last Dose  . 0.9 %  sodium chloride infusion  100 mL Intravenous PRN Roney Jaffe, MD      . 0.9 %  sodium chloride infusion  250 mL Intravenous PRN Jani Gravel, MD      . acetaminophen (TYLENOL) tablet 650 mg  650 mg Oral Q6H PRN Jani Gravel, MD       Or  . acetaminophen (TYLENOL)  suppository 650 mg  650 mg Rectal Q6H PRN Jani Gravel, MD      . ALPRAZolam Duanne Moron) tablet 0.5 mg  0.5 mg Oral TID Jani Gravel, MD      . calcitRIOL (ROCALTROL) capsule 0.5 mcg  0.5 mcg Oral Daily Jani Gravel, MD      . Chlorhexidine Gluconate Cloth 2 % PADS 6 each  6 each Topical Q0600 Roney Jaffe, MD   6 each at 12/18/17 0650  . furosemide (LASIX) tablet 40 mg  40 mg Oral Daily Jani Gravel, MD      . heparin injection 5,000 Units  5,000 Units Subcutaneous Q8H Jani Gravel, MD      . hydrALAZINE (APRESOLINE) tablet 75 mg  75 mg Oral TID Jani Gravel, MD      . lidocaine-prilocaine (EMLA) cream 1 application  1 application Topical PRN Roney Jaffe, MD      . metoprolol succinate (TOPROL-XL) 24 hr tablet 100 mg  100 mg Oral Daily Jani Gravel, MD      . ondansetron [facility]) tablet 4-8 mg  4-8 mg Oral Q8H PRN Jani Gravel, MD      . oxyCODONE-acetaminophen (PERCOCET/ROXICET) 5-325 MG per tablet 1-2 tablet  1-2 tablet Oral Q4H PRN Jani Gravel, MD      . pentafluoroprop-tetrafluoroeth (GEBAUERS) aerosol 1 application  1 application Topical PRN Roney Jaffe, MD      . sevelamer carbonate (RENVELA) tablet 1,600 mg  1,600  mg Oral Daily PRN Eulogio Bear U, DO      . sevelamer carbonate (RENVELA) tablet 2,400 mg  2,400 mg Oral TID WC Jani Gravel, MD      . sodium chloride flush (NS) 0.9 % injection 3 mL  3 mL Intravenous Q12H Jani Gravel, MD      . sodium chloride flush (NS) 0.9 % injection 3 mL  3 mL Intravenous PRN Jani Gravel, MD      . sodium polystyrene (KAYEXALATE) 15 GM/60ML suspension 15 g  15 g Oral Once Jani Gravel, MD       Labs: Basic Metabolic Panel: Recent Labs  Lab 12/18/17 0325  NA 138  K 6.5*  CL 95*  CO2 21*  GLUCOSE 82  BUN 53*  CREATININE 9.04*  CALCIUM 9.0   Liver Function Tests: Recent Labs  Lab 12/18/17 0325  AST 41  ALT 26  ALKPHOS 109  BILITOT 1.5*  PROT 6.8  ALBUMIN 3.9   Recent Labs  Lab 12/18/17 0325  LIPASE 45   No results for input(s): AMMONIA in the last 168 hours. CBC: Recent Labs  Lab 12/18/17 0325  WBC 7.2  HGB 11.2*  HCT 36.4  MCV 100.3*  PLT 233   Cardiac Enzymes: No results for input(s): CKTOTAL, CKMB, CKMBINDEX, TROPONINI in the last 168 hours. CBG: Recent Labs  Lab 12/18/17 0459  GLUCAP 83   Iron Studies: No results for input(s): IRON, TIBC, TRANSFERRIN, FERRITIN in the last 72 hours. Studies/Results: Dg Chest 2 View  Result Date: 12/18/2017 CLINICAL DATA:  Shortness of breath and pain down both arms since yesterday afternoon. EXAM: CHEST - 2 VIEW COMPARISON:  09/04/2017 FINDINGS: Cardiac enlargement. Pulmonary vascularity is normal. Fine interstitial pattern to the lung bases is more prominent than previous study and could indicate mild interstitial edema or interstitial pneumonitis. No focal consolidation. No blunting of costophrenic angles. No pneumothorax. Mediastinal contours appear intact. Surgical clips in the right upper quadrant. IMPRESSION: Cardiac enlargement. Increasing interstitial pattern to the lung bases may represent interstitial edema or  interstitial pneumonitis. Electronically Signed   By: Lucienne Capers M.D.   On:  12/18/2017 04:21    ROS: As per HPI otherwise negative.   Physical Exam: Vitals:   12/18/17 0754 12/18/17 0800 12/18/17 0830 12/18/17 0900  BP: (!) 156/84 (!) 170/95 (!) 166/88 (!) 160/87  Pulse: 92 88 87 81  Resp:      Temp:      TempSrc:      SpO2:      Weight:      Height:         General: Well developed, well nourished, in no acute distress. Head: Normocephalic, atraumatic, sclera non-icteric, mucus membranes are moist Neck: Supple. JVD not elevated. Lungs: Clear bilaterally to auscultation without wheezes, rales, or rhonchi. Breathing is unlabored. Heart: RRR with S1 X2.1/5 holosystolic M RUSB and LUSB.  Abdomen: Soft, non-tender, non-distended with normoactive bowel sounds. No rebound/guarding. No obvious abdominal masses. M-S:  Strength and tone appear normal for age. Lower extremities:without edema or ischemic changes, no open wounds  Neuro: Alert and oriented X 3. Moves all extremities spontaneously. Psych:  Responds to questions appropriately with a normal affect. Dialysis Access: LUA AVF cannulated at present.   Dialysis Orders: Center: [facility] MWF 3 hrs 15 min 180 NRe 350/600 52 kg 2.0 K/2.5 Ca  RUA AVF -No heparin -Parsabiv 2.5 mg IV TIW (not on [facility] formulary) -Mircera 30 mcg IV q 4 weeks (last dose 11/29/17 Last HGB 10.1 12/13/17)   Assessment/Plan: 1.  Hyperkalemia in the setting of compliant dialysis pt: HD today on schedule. Follow labs. Will have fistulagram while here to determine if cause of hyperkalemia is related to AV access.  2.  ESRD -  MWF [facility]. No heparin.  3.  Hypertension/volume  - Patient has no evidence of volume overload by exam, CXR does show beginning of interstitial edema. UFG 4.0 liters. H/O difficult to treat HTN, currently taking metoprolol succinate, hydralazine and furosemide. Meds have been continued.  4.  Anemia  -HGB 11.2. No ESA needed. Follow trend.  5.  Metabolic bone disease -Continue binders, VDRA.  6.  Nutrition - Albumin 3.9.  Change to renal diet, fld restrictions.   Aras Albarran H. Owens Shark, NP-C 12/18/2017, 9:06 AM  [employer] [phone]

## 2017-12-18 NOTE — Procedures (Signed)
Interventional Radiology Procedure Note  Procedure: LUE fistulagram. No intervention  Findings: Patent Antegrade flow with no reflux No flow limiting stenosis identified Parallel outflow veins overlapping in the upper arm with no limit to flow AV connection is patent .  Complications: None  Recommendations:  - Ok to use   Signed,  Dulcy Fanny. Earleen Newport, DO

## 2017-12-19 DIAGNOSIS — E875 Hyperkalemia: Secondary | ICD-10-CM | POA: Diagnosis not present

## 2017-12-19 DIAGNOSIS — Z992 Dependence on renal dialysis: Secondary | ICD-10-CM | POA: Diagnosis not present

## 2017-12-19 DIAGNOSIS — I120 Hypertensive chronic kidney disease with stage 5 chronic kidney disease or end stage renal disease: Secondary | ICD-10-CM | POA: Diagnosis not present

## 2017-12-19 DIAGNOSIS — N186 End stage renal disease: Secondary | ICD-10-CM

## 2017-12-19 LAB — CBC
HEMATOCRIT: 30 % — AB (ref 36.0–46.0)
HEMOGLOBIN: 9.5 g/dL — AB (ref 12.0–15.0)
MCH: 31 pg (ref 26.0–34.0)
MCHC: 31.7 g/dL (ref 30.0–36.0)
MCV: 98 fL (ref 78.0–100.0)
Platelets: 156 10*3/uL (ref 150–400)
RBC: 3.06 MIL/uL — ABNORMAL LOW (ref 3.87–5.11)
RDW: 14.7 % (ref 11.5–15.5)
WBC: 4 10*3/uL (ref 4.0–10.5)

## 2017-12-19 LAB — COMPREHENSIVE METABOLIC PANEL
ALBUMIN: 3.3 g/dL — AB (ref 3.5–5.0)
ALT: 19 U/L (ref 0–44)
ANION GAP: 12 (ref 5–15)
AST: 25 U/L (ref 15–41)
Alkaline Phosphatase: 96 U/L (ref 38–126)
BILIRUBIN TOTAL: 1 mg/dL (ref 0.3–1.2)
BUN: 22 mg/dL — AB (ref 6–20)
CHLORIDE: 96 mmol/L — AB (ref 98–111)
CO2: 31 mmol/L (ref 22–32)
Calcium: 8.8 mg/dL — ABNORMAL LOW (ref 8.9–10.3)
Creatinine, Ser: 5.65 mg/dL — ABNORMAL HIGH (ref 0.44–1.00)
GFR calc Af Amer: 10 mL/min — ABNORMAL LOW (ref >60)
GFR calc non Af Amer: 8 mL/min — ABNORMAL LOW (ref >60)
GLUCOSE: 93 mg/dL (ref 70–99)
POTASSIUM: 4.7 mmol/L (ref 3.5–5.1)
SODIUM: 139 mmol/L (ref 135–145)
TOTAL PROTEIN: 6 g/dL — AB (ref 6.5–8.1)

## 2017-12-19 MED ORDER — SEVELAMER CARBONATE 800 MG PO TABS
2400.0000 mg | ORAL_TABLET | Freq: Three times a day (TID) | ORAL | Status: DC
  Administered 2017-12-19: 2400 mg via ORAL
  Filled 2017-12-19: qty 3

## 2017-12-19 NOTE — Discharge Summary (Signed)
Physician Discharge Summary  Christen Wardrop VZD:[id number] DOB: 11/28/1975 DOA: 12/18/2017  PCP: Enid Skeens., MD  Admit date: 12/18/2017 Discharge date: 12/19/2017  Admitted From: home Discharge disposition: home   Recommendations for Outpatient Follow-Up:   1. Strict low K diet   Discharge Diagnosis:   Principal Problem:   Hyperkalemia Active Problems:   ESRD on dialysis [facility])    Discharge Condition: Improved.  Diet recommendation: renal  Wound care: None.  Code status: Full.   History of Present Illness:   Valerie Yates  is a 42 y.o. female, w ESRD on HD (M, W, F) , h/o hyperkalemia, Iron def anemia, who presents with c/o dyspnea and cramping. Pt denies fever, chills, cough, cp, palp, orthopnea, pnd, lower ext edema. Pt had full dialysis on Friday.   In Ed,  CXR IMPRESSION: Cardiac enlargement. Increasing interstitial pattern to the lung bases may represent interstitial edema or interstitial pneumonitis.  Lipase 45  Na 138, K 6.5, Bun 53, Creatinine 9.04 Ast 41, Alt 26 Wbc 7.2, Hgb 11.2, Plt 233 Trop 0.04  Pt will be admitted for hyperkalemia in setting of ESRD    Hospital Course by Problem:   Hyperkalemia -strict diet -s/p HD -s/p LUE fistulogram: patent with no reflux  ESRD on HD M, W, F S/p HD on 8/12  Hypertension Continue home meds    Medical Consultants:      Discharge Exam:   Vitals:   12/19/17 0501 12/19/17 0837  BP: 131/78 (!) 156/89  Pulse: 70 69  Resp: 17   Temp: (!) 97.5 F (36.4 C)   SpO2: 98%    Vitals:   12/18/17 1935 12/19/17 0159 12/19/17 0501 12/19/17 0837  BP: (!) 154/93  131/78 (!) 156/89  Pulse: 85 80 70 69  Resp: 18  17   Temp: (!) 97.4 F (36.3 C)  (!) 97.5 F (36.4 C)   TempSrc: Oral  Oral   SpO2: 98%  98%   Weight:   49.9 kg   Height:        General exam: Appears calm and comfortable.      The results of significant diagnostics from this hospitalization (including imaging,  microbiology, ancillary and laboratory) are listed below for reference.     Procedures and Diagnostic Studies:   Dg Chest 2 View  Result Date: 12/18/2017 CLINICAL DATA:  Shortness of breath and pain down both arms since yesterday afternoon. EXAM: CHEST - 2 VIEW COMPARISON:  09/04/2017 FINDINGS: Cardiac enlargement. Pulmonary vascularity is normal. Fine interstitial pattern to the lung bases is more prominent than previous study and could indicate mild interstitial edema or interstitial pneumonitis. No focal consolidation. No blunting of costophrenic angles. No pneumothorax. Mediastinal contours appear intact. Surgical clips in the right upper quadrant. IMPRESSION: Cardiac enlargement. Increasing interstitial pattern to the lung bases may represent interstitial edema or interstitial pneumonitis. Electronically Signed   By: Lucienne Capers M.D.   On: 12/18/2017 04:21   Ir Dialy Shunt Intro Needle/intracath Initial W/img Left  Result Date: 12/18/2017 INDICATION: 42 year old female with possible malfunction of left upper extremity dialysis circuit EXAM: LEFT UPPER EXTREMITY DIALYSIS CIRCUIT ANGIOGRAM MEDICATIONS: None. ANESTHESIA/SEDATION: None FLUOROSCOPY TIME:  Fluoroscopy Time: 0 minutes 36 seconds (7 mGy). COMPLICATIONS: None PROCEDURE: Informed written consent was obtained from the patient after a thorough discussion of the procedural risks, benefits and alternatives. All questions were addressed. Maximal Sterile Barrier Technique was utilized including caps, mask, sterile gowns, sterile gloves, sterile drape, hand hygiene and skin  antiseptic. A timeout was performed prior to the initiation of the procedure. The left upper extremity circuit was accessed just above the antecubital region. Complete fistulagram was performed with images through the central vasculature, as well as reflux images. Multiple obliquities of the left upper extremity obtained. No angioplasty was performed. Patient tolerated the  procedure well and remained hemodynamically stable throughout. No complications were encountered and no significant blood loss. FINDINGS: Patent venous outflow with no flow limiting stenosis. Parallel venous channels of the upper extremity, with no reflux or delayed contrast flow. No collateral venous channels. Arteriovenous anastomosis is patent without flow reduction. Central venous outflow patent. IMPRESSION: Patent left upper extremity fistula, with no significant venous outflow stenosis identified. Signed, Dulcy Fanny. Dellia Nims, RPVI [facility] ACCESS: This access remains amenable to future percutaneous interventions as clinically indicated. Electronically Signed   By: Corrie Mckusick D.O.   On: 12/18/2017 14:52     Labs:   Basic Metabolic Panel: Recent Labs  Lab 12/18/17 0325 12/18/17 1557 12/19/17 0523  NA 138 140 139  K 6.5* 4.2 4.7  CL 95* 96* 96*  CO2 21* 29 31  GLUCOSE 82 109* 93  BUN 53* 14 22*  CREATININE 9.04* 4.01* 5.65*  CALCIUM 9.0 9.4 8.8*   GFR Estimated Creatinine Clearance: 10.2 mL/min (A) (by C-G formula based on SCr of 5.65 mg/dL (H)). Liver Function Tests: Recent Labs  Lab 12/18/17 0325 12/19/17 0523  AST 41 25  ALT 26 19  ALKPHOS 109 96  BILITOT 1.5* 1.0  PROT 6.8 6.0*  ALBUMIN 3.9 3.3*   Recent Labs  Lab 12/18/17 0325  LIPASE 45   No results for input(s): AMMONIA in the last 168 hours. Coagulation profile No results for input(s): INR, PROTIME in the last 168 hours.  CBC: Recent Labs  Lab 12/18/17 0325 12/19/17 0523  WBC 7.2 4.0  HGB 11.2* 9.5*  HCT 36.4 30.0*  MCV 100.3* 98.0  PLT 233 156   Cardiac Enzymes: No results for input(s): CKTOTAL, CKMB, CKMBINDEX, TROPONINI in the last 168 hours. BNP: Invalid input(s): POCBNP CBG: Recent Labs  Lab 12/18/17 0459  GLUCAP 83   D-Dimer No results for input(s): DDIMER in the last 72 hours. Hgb A1c No results for input(s): HGBA1C  in the last 72 hours. Lipid Profile No results for input(s): CHOL, HDL, LDLCALC, TRIG, CHOLHDL, LDLDIRECT in the last 72 hours. Thyroid function studies No results for input(s): TSH, T4TOTAL, T3FREE, THYROIDAB in the last 72 hours.  Invalid input(s): FREET3 Anemia work up No results for input(s): VITAMINB12, FOLATE, FERRITIN, TIBC, IRON, RETICCTPCT in the last 72 hours. Microbiology Recent Results (from the past 240 hour(s))  MRSA PCR Screening     Status: None   Collection Time: 12/18/17 12:35 PM  Result Value Ref Range Status   MRSA by PCR NEGATIVE NEGATIVE Final    Comment:        The GeneXpert MRSA Assay (FDA approved for NASAL specimens only), is one component of a comprehensive MRSA colonization surveillance program. It is not intended to diagnose MRSA infection nor to guide or monitor treatment for MRSA infections. Performed at [facility], [place] [address] [place], [place] [address]      Discharge Instructions:   Discharge Instructions    Discharge instructions   Complete by:  As directed    Renal diet   Increase activity slowly   Complete by:  As directed      Allergies as  of 12/19/2017      Reactions   Azithromycin Anaphylaxis, Rash   Erythromycin Hives, Shortness Of Breath, Rash   Acne Aid-Dermatological. Acne Aid-Dermatological.   Penicillins Anaphylaxis, Hives, Shortness Of Breath   Has patient had a PCN reaction causing immediate rash, facial/tongue/throat swelling, SOB or lightheadedness with hypotension: #  #  #  YES  #  #  #  Has patient had a PCN reaction causing SEVERE RASH INVOLVING MUCUS MEMBRANES or SKIN NECROSIS: #  #  #  YES  #  #  #  Has patient had a PCN reaction that required hospitalization #  #  #  YES  #  #  #  Has patient had a PCN reaction occurring within the last 10 years:  #  #  #  NO  #  #  #  Has patient had a PCN reaction causing immediate rash, facial/tongue/throat swelling, SOB or lightheadedness with hypotension:  ###YES###  Has patient had a PCN reaction causing SEVERE RASH INVOLVING MUCUS MEMBRANES or SKIN NECROSIS: ###YES###  Has patient had a PCN reaction that required hospitalization ###YES###  Has patient had a PCN reaction occurring within the last 10 years:###NO###    Amlodipine    Valsartan    Morphine Sulfate Rash   Tolerates with Benadryl  Tolerates with Benadryl       Medication List    TAKE these medications   ALPRAZolam 0.5 MG tablet Commonly known as:  XANAX Take 0.5 mg by mouth 3 (three) times daily.   calcitRIOL 0.25 MCG capsule Commonly known as:  ROCALTROL Take 0.5 mcg by mouth daily.   furosemide 20 MG tablet Commonly known as:  LASIX Take 2 tablets (40 mg total) by mouth daily.   hydrALAZINE 25 MG tablet Commonly known as:  APRESOLINE Take 3 tablets (75 mg total) by mouth 3 (three) times daily.   metoprolol succinate 100 MG 24 hr tablet Commonly known as:  TOPROL-XL Take 1 tablet (100 mg total) by mouth daily. Take with or immediately following a meal.   ondansetron 4 MG tablet Commonly known as:  ZOFRAN Take 1-2 tablets (4-8 mg total) by mouth every 8 (eight) hours as needed for nausea or vomiting.   oxyCODONE-acetaminophen 5-325 MG tablet Commonly known as:  PERCOCET/ROXICET Take 1-2 tablets by mouth every 4 (four) hours as needed.   RENVELA 800 MG tablet Generic drug:  sevelamer carbonate Take 1,600-2,400 mg by mouth See admin instructions. Take 4 tablets with meals then take 2 tablets with snacks      Follow-up Information    Slatosky, Marshall Cork., MD Follow up in 1 week(s).   Specialty:  Family Medicine Contact information: [address] [place] [address] [phone]        Leonie Man, MD .   Specialty:  Cardiology Contact information: [address] [place] [address] [phone]            Time coordinating discharge: 25 min  Signed:  Geradine Girt  [facility] 12/19/2017, 9:01 AM

## 2018-02-16 ENCOUNTER — Other Ambulatory Visit: Payer: Self-pay | Admitting: Cardiology

## 2018-03-20 ENCOUNTER — Ambulatory Visit: Payer: Medicare Other | Admitting: Cardiology

## 2018-03-28 ENCOUNTER — Encounter: Payer: Self-pay | Admitting: Adult Health

## 2018-03-28 ENCOUNTER — Ambulatory Visit (INDEPENDENT_AMBULATORY_CARE_PROVIDER_SITE_OTHER): Payer: Medicare Other | Admitting: Adult Health

## 2018-03-28 VITALS — BP 138/80 | HR 76 | Ht 64.0 in | Wt 112.0 lb

## 2018-03-28 DIAGNOSIS — Z01810 Encounter for preprocedural cardiovascular examination: Secondary | ICD-10-CM | POA: Diagnosis not present

## 2018-03-28 DIAGNOSIS — N186 End stage renal disease: Secondary | ICD-10-CM | POA: Diagnosis not present

## 2018-03-28 DIAGNOSIS — I43 Cardiomyopathy in diseases classified elsewhere: Secondary | ICD-10-CM | POA: Diagnosis not present

## 2018-03-28 DIAGNOSIS — I10 Essential (primary) hypertension: Secondary | ICD-10-CM | POA: Diagnosis not present

## 2018-03-28 DIAGNOSIS — Z992 Dependence on renal dialysis: Secondary | ICD-10-CM

## 2018-03-28 MED ORDER — HYDRALAZINE HCL 25 MG PO TABS: 75.0000 mg | ORAL_TABLET | Freq: Three times a day (TID) | ORAL | 6 refills | Status: DC

## 2018-03-28 NOTE — Patient Instructions (Signed)
Medication Instructions:  NO CHANGES- Your physician recommends that you continue on your current medications as directed. Please refer to the Current Medication list given to you today.  If you need a refill on your cardiac medications before your next appointment, please call your pharmacy.  Labwork: If you have labs (blood work) drawn today and your tests are completely normal, you will receive your results only by: Marland Kitchen MyChart Message (if you have MyChart) OR . A paper copy in the mail If you have any lab test that is abnormal or we need to change your treatment, we will call you to review the results.  Testing/Procedures: Your physician has requested that you have a lexiscan myoview. A cardiac stress test is a cardiological test that measures the heart's ability to respond to external stress in a controlled clinical environment. The stress response is induced by intravenous pharmacological stimulation.  Echocardiogram - Your physician has requested that you have an echocardiogram. Echocardiography is a painless test that uses sound waves to create images of your heart. It provides your doctor with information about the size and shape of your heart and how well your heart's chambers and valves are working. This procedure takes approximately one hour. There are no restrictions for this procedure. This will be performed at our [facility] location - [address].  Follow-Up: You will need a follow up appointment in 3 MONTHS.  You may see  DR Cecil Cranker, DNP, AACC or one of the following Advanced Practice Providers on your designated Care Team:    . Jory Sims, DNP, ANP . Rosaria Ferries, PA-C  At [facility], you and your health needs are our priority.  As part of our continuing mission to provide you with exceptional heart care, we have created designated Provider Care Teams.  These Care Teams include your primary Cardiologist (physician) and Advanced  Practice Providers (APPs -  Physician Assistants and Nurse Practitioners) who all work together to provide you with the care you need, when you need it.  Thank you for choosing [facility] at [facility]!!

## 2018-03-28 NOTE — Progress Notes (Signed)
Cardiology Office Note   Date:  03/28/2018   ID:  Valerie Yates, DOB 06/09/1975, MRN [id number]  PCP:  Enid Skeens., MD  Cardiologist:  Dr. Ellyn Hack  Chief Complaint  Patient presents with  . Hypertension     History of Present Illness: Valerie Yates is a 42 y.o. female who presents for HTN, tachycardia, with history of ESRD with dialysis on MWF and anxiety about health issues. She has been undergoing dialysis on MWF and is on transplant list. BP has been elevated during dialysis and nephrologist increased hydralazine to 50 mg TID. She states that BP at home is running in the 694'W systolic. She is easily anxious and HR does go up with this.    She was last seen in the office on 12/12/2017 and was found to be hypertensive. Hydralazine was increased to 75 mg TID. She went to the ER on  12/19/2017 for complaints of dyspnea and cramping.  She was found to be hyperkalemic and treated with kayexalate.   She feels good today, BP is well controlled on higher dose of hydralazine. She has been seen by [facility] and is still on renal transplant list. They require an updated echo and NM stress test.  Renal is following her hyperkalemia.    Past Medical History:  Diagnosis Date  . Anxiety   . Complication of anesthesia    woke up before they took the endotra. tube and tries to take out per patient  . ESRD (end stage renal disease) on dialysis ([place])    Mon, Wed, Fri. at [facility] on [place] [place]  . GERD (gastroesophageal reflux disease)   . Headache    "after dialysis" reports that only when excessive fluid is removed at dialysis  . History of kidney infection   . Hyperkalemia 09/05/2017, 12/18/2017  . Hyperparathyroidism due to renal insufficiency ([place])   . Hypertension   . Iron deficiency anemia   . Migraine    "a few/year": (09/14/2015)  . Shortness of breath dyspnea    resolved per patient was due to fluid overload  . UTI (lower urinary tract infection)    recurrent    Past Surgical History:    Procedure Laterality Date  . ABDOMINAL HYSTERECTOMY  2006  . AV FISTULA PLACEMENT Left 01/27/2015   Procedure:  BASILIC VEIN TRANSPOSITION ;  Surgeon: Angelia Mould, MD;  Location: [place];  Service: Vascular;  Laterality: Left;  . DILATION AND CURETTAGE OF UTERUS    . IR DIALY SHUNT INTRO NEEDLE/INTRACATH INITIAL W/IMG LEFT Left 12/18/2017  . LAPAROSCOPIC CHOLECYSTECTOMY  2005  . NM MYOVIEW LTD  08/2014   No fixed or reversible perfusion defect - to suggest Infarct or Ischeima.. EF ~70%.    Marland Kitchen SHOULDER ARTHROSCOPY W/ ROTATOR CUFF REPAIR Left 2012  . SHOULDER ARTHROSCOPY WITH LABRAL REPAIR Right 08/11/2016   Procedure: RIGHT SHOULDER ARTHROSCOPY WITH LABRAL REPAIR;  Surgeon: Justice Britain, MD;  Location: [place];  Service: Orthopedics;  Laterality: Right;  . TRANSTHORACIC ECHOCARDIOGRAM  04/2014   Low normal EF of 50-55%. No RWMA.  Tr MR/TR.  NORMAL  . TRANSTHORACIC ECHOCARDIOGRAM  10/27/2015   [facility] (in setting of volume overload - leading to HD): Mildly impaired LV function with EF of 45-50%. Pseudo-normal LV filling (GR 2 DD. Moderate MR and moderate TR. RVSP estimated at 52 mmHg.  Marland Kitchen TRANSTHORACIC ECHOCARDIOGRAM  03/2017   Mild LVH.  Mildly reduced EF of 45-50%.  GRII DD.  Mild MR.  Moderate LA dilation.  Mild TR (  improved from moderate MR and TR on last echo, suggestive of functional regurgitation due to LV dilation from volume overload  . TUBAL LIGATION  2003     Current Outpatient Medications  Medication Sig Dispense Refill  . ALPRAZolam (XANAX) 0.5 MG tablet Take 0.5 mg by mouth 3 (three) times daily.     . furosemide (LASIX) 20 MG tablet Take 2 tablets (40 mg total) by mouth daily. 30 tablet 5  . hydrALAZINE (APRESOLINE) 25 MG tablet Take 3 tablets (75 mg total) by mouth 3 (three) times daily. 270 tablet 6  . metoprolol succinate (TOPROL-XL) 100 MG 24 hr tablet Take 1 tablet (100 mg total) by mouth daily. Take with or immediately following a meal. 30 tablet 5  .  ondansetron (ZOFRAN) 4 MG tablet Take 1-2 tablets (4-8 mg total) by mouth every 8 (eight) hours as needed for nausea or vomiting. 60 tablet 3  . oxyCODONE-acetaminophen (PERCOCET) 5-325 MG tablet Take 1-2 tablets by mouth every 4 (four) hours as needed. 40 tablet 0  . RENVELA 800 MG tablet Take 1,600-2,400 mg by mouth See admin instructions. Take 4 tablets with meals then take 2 tablets with snacks  6   No current facility-administered medications for this visit.     Allergies:   Azithromycin; Erythromycin; Penicillins; Amlodipine; Valsartan; and Morphine sulfate    Social History:  The patient  reports that she has never smoked. She has never used smokeless tobacco. She reports that she does not drink alcohol or use drugs.   Family History:  The patient's family history includes Breast cancer in her mother; Cancer in her mother; Diabetes in her maternal grandmother; Heart disease in her maternal grandfather.    ROS: All other systems are reviewed and negative. Unless otherwise mentioned in H&P    PHYSICAL EXAM: VS:  BP 138/80 (BP Location: Right Arm, Patient Position: Sitting, Cuff Size: Normal)   Pulse 76   Ht 5\' 4"  (1.626 m)   Wt 112 lb (50.8 kg)   BMI 19.22 kg/m  , BMI Body mass index is 19.22 kg/m. GEN: Well nourished, well developed, in no acute distress HEENT: normal Neck: no JVD, carotid bruits, or masses Cardiac: RRR; 1/6 systolic murmurs, rubs, or gallops,no edema  Respiratory:  Clear to auscultation bilaterally, normal work of breathing GI: soft, nontender, nondistended, + BS MS: no deformity or atrophy Skin: warm and dry, no rash Neuro:  Strength and sensation are intact Psych: euthymic mood, full affect   EKG:  Not completed today.  Recent Labs: 12/19/2017: ALT 19; BUN 22; Creatinine, Ser 5.65; Hemoglobin 9.5; Platelets 156; Potassium 4.7; Sodium 139    Lipid Panel No results found for: CHOL, TRIG, HDL, CHOLHDL, VLDL, LDLCALC, LDLDIRECT    Wt Readings from  Last 3 Encounters:  03/28/18 112 lb (50.8 kg)  12/19/17 109 lb 14.4 oz (49.9 kg)  12/12/17 116 lb 12.8 oz (53 kg)      Other studies  Echocardiogram 03/21/2017  Left ventricle: The cavity size was mildly dilated. Wall   thickness was increased in a pattern of mild LVH. Systolic   function was mildly reduced. The estimated ejection fraction was   in the range of 45% to 50%. Diffuse hypokinesis. Features are   consistent with a pseudonormal left ventricular filling pattern,   with concomitant abnormal relaxation and increased filling   pressure (grade 2 diastolic dysfunction). Doppler parameters are   consistent with high ventricular filling pressure. - Mitral valve: There was mild regurgitation. - Left  atrium: The atrium was moderately dilated. - Pericardium, extracardiac: A trivial pericardial effusion was   identified.  Impressions:  - Mild global reduction in LV systolic function; mild LVH and LVE;   moderate diastolic dysfunction; mild MR; moderate LAE; mild TR.  ASSESSMENT AND PLAN:  1. Hypertension: She is feeling well and tolerating increased dose of hydralazine 75 mg TID. She denies dizziness or fatigue. Will provide refills for her today.   2. ESRD: She is on the transplant list and will require repeat echo and Lexiscan NM stress test which will need to be sent to [facility] transplant service. Continue dialysis.   Current medicines are reviewed at length with the patient today.    Labs/ tests ordered today include: NM Stress test and echocardiogram  Phill Myron. West Pugh, ANP, AACC   03/28/2018 4:28 PM    [address] Office [phone] Fax [phone]

## 2018-03-31 ENCOUNTER — Other Ambulatory Visit: Payer: Self-pay | Admitting: Cardiology

## 2018-04-03 ENCOUNTER — Telehealth (HOSPITAL_COMMUNITY): Payer: Self-pay

## 2018-04-03 ENCOUNTER — Other Ambulatory Visit (HOSPITAL_COMMUNITY): Payer: Medicare Other

## 2018-04-03 NOTE — Telephone Encounter (Signed)
Encounter complete. 

## 2018-04-04 ENCOUNTER — Telehealth (HOSPITAL_COMMUNITY): Payer: Self-pay

## 2018-04-04 NOTE — Telephone Encounter (Signed)
Encounter complete. 

## 2018-04-09 ENCOUNTER — Telehealth: Payer: Self-pay | Admitting: Cardiology

## 2018-04-09 NOTE — Telephone Encounter (Signed)
Called patient, she advised she normally takes her hydralazine medication, I advised she could take that one, along with her morning xanax. Patient verbalized understanding.

## 2018-04-09 NOTE — Telephone Encounter (Signed)
New Message:     Pt is scheduled for a Myocardial  Perfusion Test tomorrow morining. She wants to know if she is supposed to take her morning blood pressure medicine before her test?

## 2018-04-10 ENCOUNTER — Ambulatory Visit (HOSPITAL_COMMUNITY)
Admission: RE | Admit: 2018-04-10 | Discharge: 2018-04-10 | Disposition: A | Discharge status: Home / Self Care | Payer: Medicare Other | Source: Ambulatory Visit | Attending: Cardiovascular Disease | Admitting: Cardiovascular Disease

## 2018-04-10 DIAGNOSIS — Z01810 Encounter for preprocedural cardiovascular examination: Secondary | ICD-10-CM | POA: Insufficient documentation

## 2018-04-10 LAB — MYOCARDIAL PERFUSION IMAGING
CHL CUP NUCLEAR SDS: 0
CHL CUP NUCLEAR SRS: 1
CHL CUP NUCLEAR SSS: 1
LVDIAVOL: 194 mL (ref 46–106)
LVSYSVOL: 107 mL
NUC STRESS TID: 1.08
Peak HR: 120 {beats}/min
Rest HR: 76 {beats}/min

## 2018-04-10 MED ORDER — AMINOPHYLLINE 25 MG/ML IV SOLN
75.0000 mg | Freq: Once | INTRAVENOUS | Status: AC
  Administered 2018-04-10: 75 mg via INTRAVENOUS

## 2018-04-10 MED ORDER — TECHNETIUM TC 99M TETROFOSMIN IV KIT
30.1000 | PACK | Freq: Once | INTRAVENOUS | Status: AC | PRN
  Administered 2018-04-10: 30.1 via INTRAVENOUS
  Filled 2018-04-10: qty 31

## 2018-04-10 MED ORDER — TECHNETIUM TC 99M TETROFOSMIN IV KIT
10.4000 | PACK | Freq: Once | INTRAVENOUS | Status: AC | PRN
  Administered 2018-04-10: 10.4 via INTRAVENOUS
  Filled 2018-04-10: qty 11

## 2018-04-10 MED ORDER — REGADENOSON 0.4 MG/5ML IV SOLN
0.4000 mg | Freq: Once | INTRAVENOUS | Status: AC
  Administered 2018-04-10: 0.4 mg via INTRAVENOUS

## 2018-04-11 ENCOUNTER — Telehealth: Payer: Self-pay | Admitting: *Deleted

## 2018-04-11 NOTE — Telephone Encounter (Signed)
LEFT MESSAGE TO CALL BACK - MYOVIEW RESULT  ( RENAL TRANSPLANT LIST)

## 2018-04-11 NOTE — Telephone Encounter (Signed)
-----   Message from Leonie Man, MD sent at 04/10/2018  5:14 PM EST ----- Nuclear stress test is essentially low risk.  Pump function seems to be a little bit reduced (not always accurate).  But no evidence of prior heart attack or ischemia (existing blockage).  Nothing to explain chest discomfort or dyspnea.  Glenetta Hew, MD   pls fwd to PCP: Enid Skeens., MD

## 2018-04-12 ENCOUNTER — Other Ambulatory Visit (HOSPITAL_COMMUNITY): Payer: Medicare Other

## 2018-04-19 ENCOUNTER — Encounter: Payer: Self-pay | Admitting: Cardiology

## 2018-05-03 ENCOUNTER — Telehealth: Payer: Self-pay

## 2018-05-03 NOTE — Telephone Encounter (Signed)
New message    Just an FYI. We have made several attempts to contact this patient including sending a letter to schedule or reschedule their echocardiogram. We will be removing the patient from the echo WQ.   Thank you 

## 2018-05-30 ENCOUNTER — Encounter: Payer: Self-pay | Admitting: *Deleted

## 2018-06-05 NOTE — Telephone Encounter (Signed)
Mailed letter with results. 

## 2018-06-06 IMAGING — NM NM MISC PROCEDURE
9 series · 54 of 54 positions shown · non-contrast
Comparison: none

[Series 1: wbr_r-proj_st wbr rest · 6.40mm/px · 6 of 64 frames shown]
[frame 6/64]
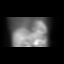
[frame 16/64]
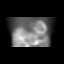
[frame 27/64]
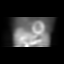
[frame 38/64]
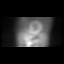
[frame 48/64]
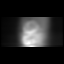
[frame 59/64]
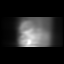

[Series 1: rest sax · 6.4mm · 6.40mm/px · 6 of 25 frames shown]
[frame 3/25]
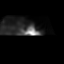
[frame 7/25]
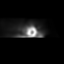
[frame 11/25]
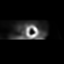
[frame 15/25]
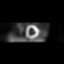
[frame 19/25]
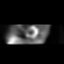
[frame 23/25]
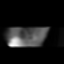

[Series 1: wbr rest · 6.40mm/px · 6 of 64 frames shown]
[frame 6/64]
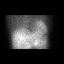
[frame 16/64]
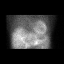
[frame 27/64]
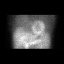
[frame 38/64]
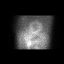
[frame 48/64]
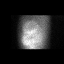
[frame 59/64]
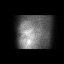

[Series 2: stress sax · 6.4mm · 6.40mm/px · 6 of 25 frames shown]
[frame 3/25]
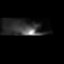
[frame 7/25]
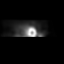
[frame 11/25]
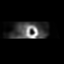
[frame 15/25]
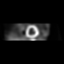
[frame 19/25]
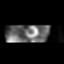
[frame 23/25]
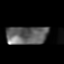

[Series 2: wbr_s-proj_st wbr stress-gsp · 6.40mm/px · 6 of 512 frames shown]
[frame 43/512]
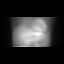
[frame 128/512]
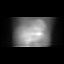
[frame 214/512]
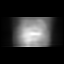
[frame 299/512]
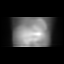
[frame 384/512]
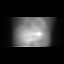
[frame 470/512]
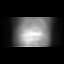

[Series 2: wbr stress-gsp · 6.40mm/px · 6 of 463 frames shown]
[frame 39/463  full-range]
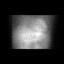
[frame 116/463  full-range]
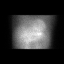
[frame 193/463  full-range]
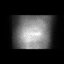
[frame 270/463  full-range]
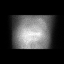
[frame 347/463  full-range]
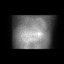
[frame 425/463  full-range]
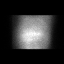

[Series 2: stress sax gs · 6.4mm · 6.40mm/px · 6 of 200 frames shown]
[frame 17/200]
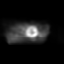
[frame 50/200]
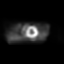
[frame 84/200]
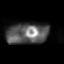
[frame 117/200]
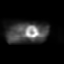
[frame 150/200]
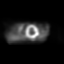
[frame 184/200]
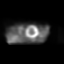

[Series 3: wbr stress-sum-em · 6.40mm/px · 6 of 64 frames shown]
[frame 6/64]
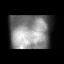
[frame 16/64]
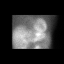
[frame 27/64]
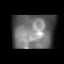
[frame 38/64]
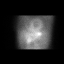
[frame 48/64]
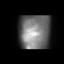
[frame 59/64]
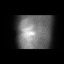

[Series 3: wbr_s-proj_st wbr stress-sum-em · 6.40mm/px · 6 of 64 frames shown]
[frame 6/64]
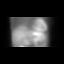
[frame 16/64]
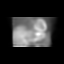
[frame 27/64]
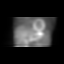
[frame 38/64]
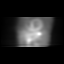
[frame 48/64]
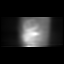
[frame 59/64]
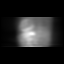

[54 of 54 positions shown; findings below may reference images not displayed]

Canned report from images found in remote index.

Refer to host system for actual result text.

## 2018-06-21 ENCOUNTER — Telehealth: Payer: Self-pay | Admitting: Cardiology

## 2018-06-21 NOTE — Telephone Encounter (Signed)
Spoke with the patient, okay given for her to take her bp medications.

## 2018-06-21 NOTE — Telephone Encounter (Signed)
New message  PT is wondering if its okay to take her BP medication the day of her echo  Please call

## 2018-06-26 ENCOUNTER — Ambulatory Visit (HOSPITAL_COMMUNITY): Payer: Medicare Other | Attending: Internal Medicine

## 2018-06-26 DIAGNOSIS — Z01810 Encounter for preprocedural cardiovascular examination: Secondary | ICD-10-CM | POA: Insufficient documentation

## 2018-06-27 ENCOUNTER — Telehealth: Payer: Self-pay

## 2018-06-27 NOTE — Telephone Encounter (Signed)
LEFT MESSAGE-THEY NEEDED AN ECHO TO CLEAR FOR TRANSPLANT. CALLING TO SEE IF THEY STILL NEED RESULTS OR IF THEY NEED IT FAXED TO THEM.

## 2018-07-02 NOTE — Telephone Encounter (Signed)
[facility] ECHO FAXED [phone]

## 2018-07-03 ENCOUNTER — Ambulatory Visit: Payer: Medicare Other | Admitting: Cardiology

## 2018-07-03 NOTE — Progress Notes (Deleted)
PCP: Enid Skeens., MD NEPHROLOGY: Dr. Moshe Cipro; Juanell Fairly, BP   Clinic Note: No chief complaint on file.   HPI: Valerie Yates is a 43 y.o. female initially seen in 2018 for evaluation of an abnormal echocardiogram showing moderate clot regurgitation and tricuspid regurgitation in the setting of volume overload.  This was ordered by her nephrology team (Dr. Mercy Moore and Juanell Fairly, NP).   She subsequently went on hemodialysis for end-stage renal disease (MWF), and symptoms notably improved after dialysis..  She is now on the transplant list.  He now presents for 73-month follow-up after being seen in November by Jory Sims, DNP for preop evaluation.  She had previously been seen in August and noted to be hypertensive.  Hydralazine dose was increased to 75 mg 3 times daily.  She subsequently went to the emergency room on August 13 with dyspnea and cramping, found to be hyperkalemic. --A Lexiscan Myoview was ordered for part of preop clearance.  Recent Hospitalizations:   none  Studies Reviewed:   Lexiscan Myoview April 10, 2018: LOW RISK.  EF 45%.  No ischemia or infarction.  Transthoracic Echo June 26, 2018: EF 60-65%.  Moderate concentric LVH (and GRII DD (consistent with hypertensive heart disease).  Mild LA dilation.  Mild mitral thickening but no prolapse or regurgitation. ->  Notably improved EF from November 2018 which showed EF of 45 to 50% with moderate mitral regurgitation.  Interval History: Valerie Yates presents today for follow-up to discuss the results of her echocardiogram for reassessing mitral valve regurgitation.  This did show improved mitral valve function with less significant MR and TR but did still have a slightly reduced EF.   And returns today feeling quite well.  She really has no major complaints to speak of.  She has some nighttime coughing, but not real PND or orthopnea type symptoms.  She still feels better but has yet to get back to her baseline  level of energy.  She notes some exertional fatigue more so than dyspnea.  She has not had any chest tightness or pressure however.  She occasionally has rare skipping heartbeats but more notably when her blood pressure goes up.  Also recently she had some fast heartbeats when she was visiting her husband sick in the hospital.  She has not had any syncope or near syncopal type symptoms.  No symptoms consistent with her prior heart failure exacerbation where she had profound dyspnea and PND, orthopnea.  No edema. She occasionally has headaches after hemodialysis and her legs bother her when she is on dialysis.  Overall, she says that her blood pressures have stabilized much more since she is switched back to Dr. Moshe Cipro for her nephrology care.  She is doing much better with dialysis and is maintaining good steady dry weight.  She does note that she will get fatigue especially if she is having more fluid drawn off. The brief heart rate spell episodes that she was noticing before they make her feel somewhat high are not happening nearly as frequently.   Cardiovascular Review of Symptoms: positive for - Some mild exertional dyspnea, occasional palpitations, low energy. negative for - chest pain, orthopnea, paroxysmal nocturnal dyspnea, shortness of breath or TIA/amaurosis fugax.  No claudication. - but legs can feel weak (like jelly)   ROS: A comprehensive was performed. Review of Systems  Constitutional: Positive for weight loss (Finally he seems to be stabilizing and hoping to start gaining some).  HENT: Negative for congestion and nosebleeds.  Respiratory: Negative for cough and shortness of breath.   Cardiovascular:       Per history of present illness No further episodes of chest pain noted in January.  Gastrointestinal: Positive for nausea (Sometimes in dialysis, and other times when her blood pressures are high.).  Musculoskeletal: Negative for joint pain and myalgias.  Skin:  Negative.   Neurological: Positive for dizziness (Occasionally, when her blood pressure gets high) and headaches (1 blood pressure gets up.  And occasionally during dialysis). Negative for tingling, sensory change, speech change and focal weakness.  Endo/Heme/Allergies: Bruises/bleeds easily.  Psychiatric/Behavioral: Negative for depression and memory loss. The patient is nervous/anxious. The patient does not have insomnia.     Past Medical History:  Diagnosis Date  . Anxiety   . Complication of anesthesia    woke up before they took the endotra. tube and tries to take out per patient  . ESRD (end stage renal disease) on dialysis ([place])    Mon, Wed, Fri. at [facility] on [address]  . GERD (gastroesophageal reflux disease)   . Headache    "after dialysis" reports that only when excessive fluid is removed at dialysis  . History of kidney infection   . Hyperkalemia 09/05/2017, 12/18/2017  . Hyperparathyroidism due to renal insufficiency ([place])   . Hypertension   . Iron deficiency anemia   . Migraine    "a few/year": (09/14/2015)  . Shortness of breath dyspnea    resolved per patient was due to fluid overload  . UTI (lower urinary tract infection)    recurrent    Past Surgical History:  Procedure Laterality Date  . ABDOMINAL HYSTERECTOMY  2006  . AV FISTULA PLACEMENT Left 01/27/2015   Procedure:  BASILIC VEIN TRANSPOSITION ;  Surgeon: Angelia Mould, MD;  Location: [place];  Service: Vascular;  Laterality: Left;  . DILATION AND CURETTAGE OF UTERUS    . IR DIALY SHUNT INTRO NEEDLE/INTRACATH INITIAL W/IMG LEFT Left 12/18/2017  . LAPAROSCOPIC CHOLECYSTECTOMY  2005  . NM MYOVIEW LTD  08/2014   No fixed or reversible perfusion defect - to suggest Infarct or Ischeima.. EF ~70%.    Marland Kitchen SHOULDER ARTHROSCOPY W/ ROTATOR CUFF REPAIR Left 2012  . SHOULDER ARTHROSCOPY WITH LABRAL REPAIR Right 08/11/2016   Procedure: RIGHT SHOULDER ARTHROSCOPY WITH LABRAL REPAIR;  Surgeon: Justice Britain, MD;   Location: [place];  Service: Orthopedics;  Laterality: Right;  . TRANSTHORACIC ECHOCARDIOGRAM  04/2014   Low normal EF of 50-55%. No RWMA.  Tr MR/TR.  NORMAL  . TRANSTHORACIC ECHOCARDIOGRAM  10/27/2015   [facility] (in setting of volume overload - leading to HD): Mildly impaired LV function with EF of 45-50%. Pseudo-normal LV filling (GR 2 DD. Moderate MR and moderate TR. RVSP estimated at 52 mmHg.  Marland Kitchen TRANSTHORACIC ECHOCARDIOGRAM  03/2017   Mild LVH.  Mildly reduced EF of 45-50%.  GRII DD.  Mild MR.  Moderate LA dilation.  Mild TR (improved from moderate MR and TR on last echo, suggestive of functional regurgitation due to LV dilation from volume overload  . TUBAL LIGATION  2003    No outpatient medications have been marked as taking for the 07/03/18 encounter (Appointment) with Leonie Man, MD.    Allergies  Allergen Reactions  . Azithromycin Anaphylaxis and Rash  . Erythromycin Hives, Shortness Of Breath and Rash    Acne Aid-Dermatological. Acne Aid-Dermatological.  . Penicillins Anaphylaxis, Hives and Shortness Of Breath    Has patient had a PCN reaction causing immediate rash, facial/tongue/throat  swelling, SOB or lightheadedness with hypotension: #  #  #  YES  #  #  #  Has patient had a PCN reaction causing SEVERE RASH INVOLVING MUCUS MEMBRANES or SKIN NECROSIS: #  #  #  YES  #  #  #  Has patient had a PCN reaction that required hospitalization #  #  #  YES  #  #  #  Has patient had a PCN reaction occurring within the last 10 years:  #  #  #  NO  #  #  #   Has patient had a PCN reaction causing immediate rash, facial/tongue/throat swelling, SOB or lightheadedness with hypotension: ###YES###  Has patient had a PCN reaction causing SEVERE RASH INVOLVING MUCUS MEMBRANES or SKIN NECROSIS: ###YES###  Has patient had a PCN reaction that required hospitalization ###YES###  Has patient had a PCN reaction occurring within the last 10  years:###NO###   . Amlodipine   . Valsartan   . Morphine Sulfate Rash    Tolerates with Benadryl  Tolerates with Benadryl    Social History   Tobacco Use  . Smoking status: Never Smoker  . Smokeless tobacco: Never Used  Substance Use Topics  . Alcohol use: No    Alcohol/week: 0.0 standard drinks  . Drug use: No   Social History   Social History Narrative  . Not on file    Family History  Problem Relation Age of Onset  . Diabetes Maternal Grandmother   . Cancer Mother        Breast  . Breast cancer Mother   . Heart disease Maternal Grandfather     Wt Readings from Last 3 Encounters:  04/10/18 112 lb (50.8 kg)  03/28/18 112 lb (50.8 kg)  12/19/17 109 lb 14.4 oz (49.9 kg)    PHYSICAL EXAM There were no vitals taken for this visit.  Physical Exam  Constitutional: She is oriented to person, place, and time. She appears well-developed and well-nourished. No distress.  HENT:  Head: Normocephalic.  Neck: No JVD present.  Cardiovascular: Normal rate, regular rhythm and normal pulses.  No extrasystoles are present. PMI is not displaced. Exam reveals no gallop and no friction rub.  Murmur heard.  Medium-pitched harsh decrescendo holosystolic murmur is present with a grade of 3/6 at the upper left sternal border and lower left sternal border. Pulmonary/Chest: Effort normal and breath sounds normal. No respiratory distress. She has no wheezes. She has no rales.  Abdominal: Soft. Bowel sounds are normal. She exhibits no distension. There is no abdominal tenderness.  Musculoskeletal: Normal range of motion.  Neurological: She is alert and oriented to person, place, and time.  Skin: Skin is warm and dry. No rash noted. No erythema.  Psychiatric: She has a normal mood and affect. Her behavior is normal. Judgment and thought content normal.  Nursing note and vitals reviewed.   Adult ECG Report Sinus rhythm, rate 74 bpm.  Borderline criteria for LVH.  Lateral T  wave inversions.  Borderline QT interval.  Relatively stable EKG.  Other studies Reviewed: Additional studies/ records that were reviewed today include:  Recent Labs: Not available   ASSESSMENT / PLAN: Problem List Items Addressed This Visit    None      Current medicines are reviewed at length with the patient today. (+/- concerns)  The following changes have been made:   There are no Patient Instructions on file for this visit. Studies Ordered:   No orders of the defined  types were placed in this encounter.    Glenetta Hew, M.D., M.S. Interventional Cardiologist   Pager # [phone] Phone # [phone] [address] [place], [place] [address]

## 2018-07-06 ENCOUNTER — Other Ambulatory Visit: Payer: Self-pay | Admitting: *Deleted

## 2018-10-11 ENCOUNTER — Telehealth: Payer: Self-pay | Admitting: Cardiology

## 2018-10-11 NOTE — Telephone Encounter (Signed)
New Message    Pt c/o medication issue:  1. Name of Medication: Hydralazine   2. How are you currently taking this medication (dosage and times per day)? 25mg  3 tablets by mouth 3 times a day  3. Are you having a reaction (difficulty breathing--STAT)? N/A  4. What is your medication issue? Patient Urologist wants her to increase the med by 1 tablet each day and patient wants to make sure its ok with Dr. Ellyn Hack for her to do so.

## 2018-10-18 NOTE — Telephone Encounter (Signed)
Make it easier for her - change to 4 tab 2 x daily (I.e. 100 mg BID) -- can change refill Rx to: Hydralazine 100 mg PO BID  Disp: 180  Tab 3 refills.'    Glenetta Hew

## 2018-10-19 MED ORDER — HYDRALAZINE HCL 100 MG PO TABS: 100.0000 mg | ORAL_TABLET | Freq: Two times a day (BID) | ORAL | 3 refills | Status: DC

## 2018-10-19 NOTE — Telephone Encounter (Signed)
SPOKE TO PATIENT  - INFORMATION GIVEN  RX SENT TO PHARMACY CHANGE TO 100 MG TWICE A DAY HYDRALAZINE  PATIENT VERBALIZED UNDERSTANDING.

## 2018-10-25 NOTE — Telephone Encounter (Signed)
The whole point was for her not to have to take a medicine 3 times a day. Upping it 25 mg really was not to make that much of a difference.  Makes it easier to do 150 mg twice daily  Glenetta Hew, MD

## 2018-10-25 NOTE — Telephone Encounter (Signed)
  Patient is calling in regards to previous phone note. She is stating that she was taking Hydralazine 75 mg three times daily and she was needing to have the prescription upped. So it was changed to 100 mg twice daily which is less than she was taking before. Can prescription be changed to 100 mg three times daily safely?

## 2018-10-29 NOTE — Telephone Encounter (Signed)
PT NOTIFIED  

## 2018-11-01 MED ORDER — HYDRALAZINE HCL 50 MG PO TABS: ORAL_TABLET | ORAL | 3 refills | Status: DC

## 2018-11-01 NOTE — Addendum Note (Signed)
Addended by: Alvina Filbert B on: 11/01/2018 03:01 PM   Modules accepted: Orders

## 2018-11-01 NOTE — Telephone Encounter (Signed)
Spoke with patient and reviewed the dose of Hydralazine 150 mg twice a day Dr Ellyn Hack suggested. New Rx for the 50 mg tablets to take with the 100 mg sent to [facility]

## 2018-11-01 NOTE — Telephone Encounter (Signed)
Follow up   Patient states that she did not get a call back about the medication dosage per the previous messages. Please call the patient.

## 2018-11-01 NOTE — Telephone Encounter (Signed)
Patient needs to know how to take the medication. She states that she is supposed to start taking medicine tomorrow. Please call.

## 2018-12-13 ENCOUNTER — Encounter: Payer: Medicare Other | Admitting: Vascular Surgery

## 2019-01-01 ENCOUNTER — Telehealth (HOSPITAL_COMMUNITY): Payer: Self-pay | Admitting: Rehabilitation

## 2019-01-01 NOTE — Telephone Encounter (Signed)

## 2019-01-02 ENCOUNTER — Ambulatory Visit (INDEPENDENT_AMBULATORY_CARE_PROVIDER_SITE_OTHER): Payer: Medicare Other | Admitting: Vascular Surgery

## 2019-01-02 ENCOUNTER — Other Ambulatory Visit: Payer: Self-pay

## 2019-01-02 ENCOUNTER — Encounter: Payer: Self-pay | Admitting: Vascular Surgery

## 2019-01-02 VITALS — BP 150/89 | HR 79 | Temp 97.4°F | Resp 18 | Ht 64.0 in | Wt 113.6 lb

## 2019-01-02 DIAGNOSIS — Z992 Dependence on renal dialysis: Secondary | ICD-10-CM | POA: Diagnosis not present

## 2019-01-02 DIAGNOSIS — N186 End stage renal disease: Secondary | ICD-10-CM | POA: Diagnosis not present

## 2019-01-02 NOTE — Progress Notes (Signed)
REASON FOR CONSULT:    Thinning skin overlying AV fistula.  The consult was requested by Dr. Otelia Santee.  ASSESSMENT & PLAN:   END-STAGE RENAL DISEASE: This patient has a functioning left basilic vein transposition which has been working well.  However at the dialysis center they are not rotating the needles.  It looks like to cannulating the same areas repeatedly.  Currently the fistula has an excellent thrill so I do not suspect an outflow obstruction and I do not think that a fistulogram is indicated at this point.  Likewise as there is no ulcerations overlying the fistula I do not see any indication for revising the fistula.  If she develops a specific ulcer than that would be a different story and certainly we could consider excising the ulcer and trying to close the skin over the fistula although she does not have a lot of soft tissue and this would be technically challenging.  She is on the transplant list and hopefully she will be a candidate for transplant.  I will see her back as needed.   Valerie Mayo, MD, FACS Beeper [phone] Office: [phone]   HPI:   Valerie Yates is a pleasant 43 y.o. female, who was referred for evaluation of some thinning skin overlying her left basilic vein transposition.  This was placed approximately 5 years ago.  Initially they had problems cannulating it for about 2 months but since then of the fistula has worked well.  She has had a couple recent episodes where the fistula was more difficult to cannulate when they made an attempt to rotate the needles.  However there are specific text at the dialysis center that seemed did not have any problems with her dialysis.  She denies any recent uremic symptoms.  Past Medical History:  Diagnosis Date  . Anxiety   . Complication of anesthesia    woke up before they took the endotra. tube and tries to take out per patient  . ESRD (end stage renal disease) on dialysis ([place])    Mon, Wed, Fri. at [facility] on  [address]  . GERD (gastroesophageal reflux disease)   . Headache    "after dialysis" reports that only when excessive fluid is removed at dialysis  . History of kidney infection   . Hyperkalemia 09/05/2017, 12/18/2017  . Hyperparathyroidism due to renal insufficiency ([place])   . Hypertension   . Iron deficiency anemia   . Migraine    "a few/year": (09/14/2015)  . Shortness of breath dyspnea    resolved per patient was due to fluid overload  . UTI (lower urinary tract infection)    recurrent    Family History  Problem Relation Age of Onset  . Diabetes Maternal Grandmother   . Cancer Mother        Breast  . Breast cancer Mother   . Heart disease Maternal Grandfather     SOCIAL HISTORY: Social History   Socioeconomic History  . Marital status: Married    Spouse name: Not on file  . Number of children: 1  . Years of education: Not on file  . Highest education level: Not on file  Occupational History    Comment: Disabled  Social Needs  . Financial resource strain: Not on file  . Food insecurity    Worry: Not on file    Inability: Not on file  . Transportation needs    Medical: Not on file    Non-medical: Not on file  Tobacco Use  .  Smoking status: Never Smoker  . Smokeless tobacco: Never Used  Substance and Sexual Activity  . Alcohol use: No    Alcohol/week: 0.0 standard drinks  . Drug use: No  . Sexual activity: Yes    Birth control/protection: Surgical  Lifestyle  . Physical activity    Days per week: Not on file    Minutes per session: Not on file  . Stress: Not on file  Relationships  . Social Herbalist on phone: Not on file    Gets together: Not on file    Attends religious service: Not on file    Active member of club or organization: Not on file    Attends meetings of clubs or organizations: Not on file    Relationship status: Not on file  . Intimate partner violence    Fear of current or ex partner: Not on file    Emotionally abused: Not  on file    Physically abused: Not on file    Forced sexual activity: Not on file  Other Topics Concern  . Not on file  Social History Narrative  . Not on file    Allergies  Allergen Reactions  . Azithromycin Anaphylaxis and Rash  . Erythromycin Hives, Shortness Of Breath and Rash    Acne Aid-Dermatological. Acne Aid-Dermatological.  . Penicillins Anaphylaxis, Hives and Shortness Of Breath    Has patient had a PCN reaction causing immediate rash, facial/tongue/throat swelling, SOB or lightheadedness with hypotension: #  #  #  YES  #  #  #  Has patient had a PCN reaction causing SEVERE RASH INVOLVING MUCUS MEMBRANES or SKIN NECROSIS: #  #  #  YES  #  #  #  Has patient had a PCN reaction that required hospitalization #  #  #  YES  #  #  #  Has patient had a PCN reaction occurring within the last 10 years:  #  #  #  NO  #  #  #   Has patient had a PCN reaction causing immediate rash, facial/tongue/throat swelling, SOB or lightheadedness with hypotension: ###YES###  Has patient had a PCN reaction causing SEVERE RASH INVOLVING MUCUS MEMBRANES or SKIN NECROSIS: ###YES###  Has patient had a PCN reaction that required hospitalization ###YES###  Has patient had a PCN reaction occurring within the last 10 years:###NO###   . Amlodipine   . Valsartan   . Morphine Sulfate Rash    Tolerates with Benadryl  Tolerates with Benadryl     Current Outpatient Medications  Medication Sig Dispense Refill  . ALPRAZolam (XANAX) 0.5 MG tablet Take 0.5 mg by mouth 3 (three) times daily.     . B Complex-C-Zn-Folic Acid (DIALYVITE 476 WITH ZINC) 0.8 MG TABS Take 1 tablet by mouth daily.    . calcitRIOL (ROCALTROL) 0.25 MCG capsule TAKE 1 CAPSULE BY MOUTH EVERY DAY AS DIRECTED    . carvedilol (COREG) 25 MG tablet     . furosemide (LASIX) 20 MG tablet TAKE 2 TABLETS BY MOUTH ONCE DAILY 180 tablet 3  . hydrALAZINE (APRESOLINE) 100 MG tablet Take 1 tablet (100 mg  total) by mouth 2 (two) times daily. (Patient taking differently: Take 100 mg by mouth 3 (three) times daily. ) 180 tablet 3  . ondansetron (ZOFRAN) 4 MG tablet Take 1-2 tablets (4-8 mg total) by mouth every 8 (eight) hours as needed for nausea or vomiting. 60 tablet 3  . oxyCODONE-acetaminophen (PERCOCET) 5-325 MG tablet Take 1-2  tablets by mouth every 4 (four) hours as needed. 40 tablet 0  . RENVELA 800 MG tablet Take 1,600-2,400 mg by mouth See admin instructions. Take 4 tablets with meals then take 2 tablets with snacks  6  . Sodium Zirconium Cyclosilicate (LOKELMA PO) Take 5 mg by mouth. T-TH-SUN     No current facility-administered medications for this visit.     REVIEW OF SYSTEMS:  [X]  denotes positive finding, [ ]  denotes negative finding Cardiac  Comments:  Chest pain or chest pressure:    Shortness of breath upon exertion:    Short of breath when lying flat:    Irregular heart rhythm:        Vascular    Pain in calf, thigh, or hip brought on by ambulation:    Pain in feet at night that wakes you up from your sleep:     Blood clot in your veins:    Leg swelling:         Pulmonary    Oxygen at home:    Productive cough:     Wheezing:         Neurologic    Sudden weakness in arms or legs:     Sudden numbness in arms or legs:     Sudden onset of difficulty speaking or slurred speech:    Temporary loss of vision in one eye:     Problems with dizziness:         Gastrointestinal    Blood in stool:     Vomited blood:         Genitourinary    Burning when urinating:     Blood in urine:        Psychiatric    Major depression:         Hematologic    Bleeding problems:    Problems with blood clotting too easily:        Skin    Rashes or ulcers:        Constitutional    Fever or chills:     PHYSICAL EXAM:   Vitals:   01/02/19 1333  BP: (!) 150/89  Pulse: 79  Resp: 18  Temp: (!) 97.4 F (36.3 C)  SpO2: 99%  Weight: 113 lb 9.6 oz (51.5 kg)  Height: 5\' 4"   (1.626 m)    GENERAL: The patient is a well-nourished female, in no acute distress. The vital signs are documented above. CARDIAC: There is a regular rate and rhythm.  VASCULAR: She has an excellent thrill in her left upper arm fistula and a palpable left radial pulse.  There is no pulsatility to the fistula to suggest an outflow obstruction.  Left basilic vein transposition     PULMONARY: There is good air exchange bilaterally without wheezing or rales. NEUROLOGIC: No focal weakness or paresthesias are detected. SKIN: There are no ulcers or rashes noted. PSYCHIATRIC: The patient has a normal affect.  DATA:    No new data

## 2019-03-01 ENCOUNTER — Encounter (INDEPENDENT_AMBULATORY_CARE_PROVIDER_SITE_OTHER): Payer: Self-pay

## 2019-03-04 ENCOUNTER — Other Ambulatory Visit: Payer: Self-pay

## 2019-03-04 MED ORDER — HYDRALAZINE HCL 100 MG PO TABS: 100.0000 mg | ORAL_TABLET | Freq: Two times a day (BID) | ORAL | 0 refills | Status: DC

## 2021-07-19 ENCOUNTER — Encounter: Payer: Self-pay | Admitting: Gastroenterology

## 2022-11-01 ENCOUNTER — Encounter: Payer: Self-pay | Admitting: Physician Assistant

## 2023-01-13 ENCOUNTER — Ambulatory Visit: Payer: Medicare HMO | Admitting: Physician Assistant

## 2023-08-03 ENCOUNTER — Encounter (HOSPITAL_COMMUNITY): Payer: Self-pay | Admitting: Orthopedic Surgery

## 2023-08-03 NOTE — Progress Notes (Signed)
 Orders requested via epic in basket [employer] PA-C

## 2023-08-07 ENCOUNTER — Encounter (HOSPITAL_COMMUNITY): Payer: Self-pay | Admitting: Orthopedic Surgery

## 2023-08-07 ENCOUNTER — Other Ambulatory Visit: Payer: Self-pay

## 2023-08-07 NOTE — Progress Notes (Addendum)
 COVID Vaccine Completed:  Date of COVID positive in last 90 days:  No  PCP - Cheri Rous, MD (note under media)  Cardiologist - Bryan Lemma, MD (last OV 2019)  Nephrologist - Annie Sable, MD (scheduled to see on 08-09-23 and to have lab work)  Chest x-ray - N/A EKG - Day of surgery  Stress Test - 04-10-18 Epic ECHO - 03-23-19 [facility] Cardiac Cath - N/A Pacemaker/ICD device last checked: Spinal Cord Stimulator:  N/A  Bowel Prep - N/A  Sleep Study - N/A CPAP -   Fasting Blood Sugar - N/A Checks Blood Sugar _____ times a day  Last dose of GLP1 agonist-  N/A GLP1 instructions:  Hold 7 days before surgery    Last dose of SGLT-2 inhibitors-  N/A SGLT-2 instructions:  Hold 3 days before surgery   Blood Thinner Instructions:  N/A Aspirin Instructions: Last Dose:  Activity level:  Can go up a flight of stairs and perform activities of daily living without stopping and without symptoms of chest pain or shortness of breath.  Anesthesia review:  Tachycardia, HTN, hx of kidney transplant for ESRD due to HTN.  Hx of dialysis, fistula L arm  Patient denies shortness of breath, fever, cough and chest pain at PAT appointment (completed over the phone)  Patient verbalized understanding of instructions that were given to them at the PAT appointment. Patient was also instructed that they will need to review over the PAT instructions again at home before surgery.

## 2023-08-08 ENCOUNTER — Encounter (HOSPITAL_COMMUNITY): Payer: Self-pay | Admitting: Orthopedic Surgery

## 2023-08-08 NOTE — Progress Notes (Signed)
 Case: [id number] Date/Time: 08/10/23 1209   Procedure: ARTHROSCOPY, SHOULDER, WITH ROTATOR CUFF REPAIR (Right)   Anesthesia type: General   Diagnosis: Displacement of permanent sutures, initial encounter [A54.098J]   Pre-op diagnosis: Displaced suture anchor right shoulder   Location: [facility] ROOM 06 / [facility]   Surgeons: Francena Hanly, MD       DISCUSSION: Valerie Yates is a 48 yo female who is a SDW prior to surgery above. PMH of HTN, hx of NICM, HFpEF, ESRD s/p kidney transplant in 2020, migraines, GERD, anemia, arthritis, anxiety, chronic pain with narcotic dependence.  Patient had right shoulder surgery on 05/16/23 with Dr. Rennis Chris but now with "evidence of displaced anterior lateral row suture anchor". Unable to review these records.   Complication from anesthesia includes intra-op awareness.   Patient had a kidney transplant in 2020 and was being followed by [facility]. Last seen in 12/2020 and kidney function was stable at that time. Patient is currently followed by Dr. Kathrene Bongo at [facility]. Last seen in June 2024. Creatinine 1.7 at that time (her baseline is ~1.6). Nephrology is also managing her immunosuppressants.   Last seen by PCP on 3/20 for back pain and anxiety. Pain medicines refilled and advised f/u in 6 months.  Patient previously seen by Cardiology for hx of heart murmur, NICM, and diastolic CHF. Echo in 2018 showed mildly reduced EF of 45-50%, mild LVH, grade 2 DD, mild MR and TR. She was advised to undergo a stress test to exclude ischemia which came back low risk in 2018. She had another stress test in 2019 prior to w/u for renal transplant which was low risk again. Most recent echo in 06/2018 showed normal EF of 60-65%, moderate LVH, grade III DD. She has since been lost to follow up.  Patient denied any symptoms over PAT phone call. EKG and labs for DOS.  VS: Ht 5\' 4"  (1.626 m)   Wt 63.5 kg   BMI 24.03 kg/m   PROVIDERS: Nonnie Done., MD Cardiologist - Bryan Lemma, MD (last OV 2019) Nephrologist - Annie Sable, MD (scheduled to see on 08-09-23 and to have lab work)  LABS: Obtain DOS   IMAGES:   EKG: Obtain DOS   CV:  Echo 06/26/2018:  IMPRESSIONS     1. The left ventricle has normal systolic function with an ejection  fraction of 60-65%. The cavity size was normal. There is moderately  increased left ventricular wall thickness. Left ventricular diastolic  Doppler parameters are consistent with  pseudonormalization Elevated left ventricular end-diastolic pressure The  E/e' is >15.   2. The right ventricle has normal systolic function. The cavity was  normal. There is no increase in right ventricular wall thickness.   3. Left atrial size was mildly dilated.   4. The mitral valve is degenerative. Mild thickening of the mitral valve  leaflet.   5. The tricuspid valve is normal in structure.   6. The aortic valve is tricuspid.   7. The pulmonic valve was normal in structure.   8. The aortic root and ascending aorta are normal in size and structure.   9. When compared to the prior study: 03/21/2017 - LVEF 45-50%.   Stress test 04/10/2018:  Nuclear stress EF: 45%. The left ventricular ejection fraction is mildly decreased (45-54%). There was no ST segment deviation noted during stress. This is a low risk study. No evidence of ischemia or previous MI  Past Medical History:  Diagnosis Date   Anxiety  Arthritis    Complication of anesthesia    woke up before they took the endotra. tube and tries to take out per patient   ESRD (end stage renal disease) on dialysis ([facility])    Mon, Wed, Fri. at [facility] on [address]   GERD (gastroesophageal reflux disease)    Headache    "after dialysis" reports that only when excessive fluid is removed at dialysis   History of kidney infection    History of kidney stones    Hyperkalemia 09/05/2017, 12/18/2017   Hyperparathyroidism due to renal insufficiency ([facility])    Hypertension     Iron deficiency anemia    Migraine    "a few/year": (09/14/2015)   Shortness of breath dyspnea    resolved per patient was due to fluid overload   UTI (lower urinary tract infection)    recurrent    Past Surgical History:  Procedure Laterality Date   ABDOMINAL HYSTERECTOMY  2006   AV FISTULA PLACEMENT Left 01/27/2015   Procedure:  BASILIC VEIN TRANSPOSITION ;  Surgeon: Chuck Hint, MD;  Location: [facility] OR;  Service: Vascular;  Laterality: Left;   DILATION AND CURETTAGE OF UTERUS     IR DIALY SHUNT INTRO NEEDLE/INTRACATH INITIAL W/IMG LEFT Left 12/18/2017   KIDNEY TRANSPLANT     LAPAROSCOPIC CHOLECYSTECTOMY  2005   NM MYOVIEW LTD  08/2014   No fixed or reversible perfusion defect - to suggest Infarct or Ischeima.. EF ~70%.     SHOULDER ARTHROSCOPY W/ ROTATOR CUFF REPAIR Left 2012   SHOULDER ARTHROSCOPY WITH LABRAL REPAIR Right 08/11/2016   Procedure: RIGHT SHOULDER ARTHROSCOPY WITH LABRAL REPAIR;  Surgeon: Francena Hanly, MD;  Location: [facility] OR;  Service: Orthopedics;  Laterality: Right;   TRANSTHORACIC ECHOCARDIOGRAM  04/2014   Low normal EF of 50-55%. No RWMA.  Tr MR/TR.  NORMAL   TRANSTHORACIC ECHOCARDIOGRAM  10/27/2015   [facility] (in setting of volume overload - leading to HD): Mildly impaired LV function with EF of 45-50%. Pseudo-normal LV filling (GR 2 DD. Moderate MR and moderate TR. RVSP estimated at 52 mmHg.   TRANSTHORACIC ECHOCARDIOGRAM  03/2017   Mild LVH.  Mildly reduced EF of 45-50%.  GRII DD.  Mild MR.  Moderate LA dilation.  Mild TR (improved from moderate MR and TR on last echo, suggestive of functional regurgitation due to LV dilation from volume overload   TUBAL LIGATION  2003    MEDICATIONS: No current facility-administered medications for this encounter.    acetaminophen (TYLENOL) 650 MG CR tablet   ALPRAZolam (XANAX) 0.5 MG tablet   carvedilol (COREG) 25 MG tablet   mycophenolate (MYFORTIC) 180 MG EC tablet   oxyCODONE-acetaminophen (PERCOCET)  5-325 MG tablet   predniSONE (DELTASONE) 5 MG tablet   sulfamethoxazole-trimethoprim (BACTRIM) 400-80 MG tablet   tacrolimus (PROGRAF) 0.5 MG capsule   Ubaldo Glassing, PA-C [facility] Surgical Short Stay/Anesthesiology [facility] Phone [phone] 08/08/2023 10:46 AM

## 2023-08-09 NOTE — Anesthesia Preprocedure Evaluation (Signed)
 Anesthesia Evaluation  Patient identified by MRN, date of birth, ID band Patient awake    Reviewed: Allergy & Precautions, H&P , NPO status , Patient's Chart, lab work & pertinent test results  Airway Mallampati: II   Neck ROM: full    Dental   Pulmonary shortness of breath   breath sounds clear to auscultation       Cardiovascular hypertension,  Rhythm:regular Rate:Normal     Neuro/Psych  Headaches  Anxiety        GI/Hepatic ,GERD  ,,  Endo/Other    Renal/GU ESRF and DialysisRenal disease     Musculoskeletal  (+) Arthritis ,    Abdominal   Peds  Hematology   Anesthesia Other Findings   Reproductive/Obstetrics                             Anesthesia Physical Anesthesia Plan  ASA: 4  Anesthesia Plan: General   Post-op Pain Management: Regional block*   Induction: Intravenous  PONV Risk Score and Plan: 3 and Ondansetron, Dexamethasone, Midazolam and Treatment may vary due to age or medical condition  Airway Management Planned: Oral ETT  Additional Equipment:   Intra-op Plan:   Post-operative Plan: Extubation in OR  Informed Consent: I have reviewed the patients History and Physical, chart, labs and discussed the procedure including the risks, benefits and alternatives for the proposed anesthesia with the patient or authorized representative who has indicated his/her understanding and acceptance.     Dental advisory given  Plan Discussed with: CRNA, Anesthesiologist and Surgeon  Anesthesia Plan Comments: (See PAT note from 4/1)        Anesthesia Quick Evaluation

## 2023-08-10 ENCOUNTER — Other Ambulatory Visit: Payer: Self-pay

## 2023-08-10 ENCOUNTER — Ambulatory Visit (HOSPITAL_COMMUNITY)
Admission: RE | Admit: 2023-08-10 | Discharge: 2023-08-10 | Disposition: A | Discharge status: Home / Self Care | Attending: Orthopedic Surgery | Admitting: Orthopedic Surgery

## 2023-08-10 ENCOUNTER — Encounter (HOSPITAL_COMMUNITY)
Admission: RE | Disposition: A | Discharge status: Home / Self Care | Payer: Self-pay | Source: Home / Self Care | Attending: Orthopedic Surgery

## 2023-08-10 ENCOUNTER — Ambulatory Visit (HOSPITAL_BASED_OUTPATIENT_CLINIC_OR_DEPARTMENT_OTHER): Payer: Self-pay | Admitting: Medical

## 2023-08-10 ENCOUNTER — Other Ambulatory Visit: Payer: Self-pay | Admitting: Infectious Diseases

## 2023-08-10 ENCOUNTER — Encounter (HOSPITAL_COMMUNITY): Payer: Self-pay | Admitting: Orthopedic Surgery

## 2023-08-10 ENCOUNTER — Ambulatory Visit (HOSPITAL_COMMUNITY): Payer: Self-pay | Admitting: Medical

## 2023-08-10 DIAGNOSIS — I120 Hypertensive chronic kidney disease with stage 5 chronic kidney disease or end stage renal disease: Secondary | ICD-10-CM | POA: Diagnosis not present

## 2023-08-10 DIAGNOSIS — Z992 Dependence on renal dialysis: Secondary | ICD-10-CM

## 2023-08-10 DIAGNOSIS — T85622A Displacement of permanent sutures, initial encounter: Secondary | ICD-10-CM | POA: Diagnosis not present

## 2023-08-10 DIAGNOSIS — Z7952 Long term (current) use of systemic steroids: Secondary | ICD-10-CM | POA: Insufficient documentation

## 2023-08-10 DIAGNOSIS — D649 Anemia, unspecified: Secondary | ICD-10-CM

## 2023-08-10 DIAGNOSIS — N186 End stage renal disease: Secondary | ICD-10-CM

## 2023-08-10 DIAGNOSIS — E1122 Type 2 diabetes mellitus with diabetic chronic kidney disease: Secondary | ICD-10-CM | POA: Diagnosis not present

## 2023-08-10 DIAGNOSIS — X58XXXA Exposure to other specified factors, initial encounter: Secondary | ICD-10-CM | POA: Insufficient documentation

## 2023-08-10 DIAGNOSIS — I10 Essential (primary) hypertension: Secondary | ICD-10-CM

## 2023-08-10 HISTORY — DX: Personal history of urinary calculi: Z87.442

## 2023-08-10 HISTORY — DX: Unspecified osteoarthritis, unspecified site: M19.90

## 2023-08-10 HISTORY — PX: SHOULDER ARTHROSCOPY WITH ROTATOR CUFF REPAIR: SHX5685

## 2023-08-10 SURGERY — ARTHROSCOPY, SHOULDER, WITH ROTATOR CUFF REPAIR
Anesthesia: General | Site: Shoulder | Laterality: Right

## 2023-08-10 MED ORDER — FENTANYL CITRATE PF 50 MCG/ML IJ SOSY
PREFILLED_SYRINGE | INTRAMUSCULAR | Status: AC
  Filled 2023-08-10: qty 1

## 2023-08-10 MED ORDER — OXYCODONE HCL 5 MG PO TABS: 5.0000 mg | ORAL_TABLET | Freq: Once | ORAL | Status: DC | PRN

## 2023-08-10 MED ORDER — HYDROMORPHONE HCL 2 MG PO TABS: 2.0000 mg | ORAL_TABLET | ORAL | 0 refills | Status: AC | PRN

## 2023-08-10 MED ORDER — MIDAZOLAM HCL 2 MG/2ML IJ SOLN
INTRAMUSCULAR | Status: AC
  Administered 2023-08-10: 2 mg
  Filled 2023-08-10: qty 2

## 2023-08-10 MED ORDER — ONDANSETRON HCL 4 MG PO TABS: 4.0000 mg | ORAL_TABLET | Freq: Three times a day (TID) | ORAL | 0 refills | Status: AC | PRN

## 2023-08-10 MED ORDER — PROPOFOL 10 MG/ML IV BOLUS
INTRAVENOUS | Status: DC | PRN
  Administered 2023-08-10: 160 mg via INTRAVENOUS

## 2023-08-10 MED ORDER — FENTANYL CITRATE (PF) 100 MCG/2ML IJ SOLN
INTRAMUSCULAR | Status: AC
  Filled 2023-08-10: qty 2

## 2023-08-10 MED ORDER — BUPIVACAINE HCL (PF) 0.5 % IJ SOLN
INTRAMUSCULAR | Status: DC | PRN
Start: 2023-08-10 — End: 2023-08-10
  Administered 2023-08-10: 25 mL via PERINEURAL

## 2023-08-10 MED ORDER — CYCLOBENZAPRINE HCL 10 MG PO TABS: 10.0000 mg | ORAL_TABLET | Freq: Three times a day (TID) | ORAL | 1 refills | Status: AC | PRN

## 2023-08-10 MED ORDER — PROPOFOL 10 MG/ML IV BOLUS
INTRAVENOUS | Status: AC
Start: 2023-08-10 — End: ?
  Filled 2023-08-10: qty 20

## 2023-08-10 MED ORDER — LACTATED RINGERS IV SOLN: INTRAVENOUS | Status: DC

## 2023-08-10 MED ORDER — FENTANYL CITRATE (PF) 100 MCG/2ML IJ SOLN
INTRAMUSCULAR | Status: DC | PRN
  Administered 2023-08-10 (×2): 50 ug via INTRAVENOUS

## 2023-08-10 MED ORDER — CEFAZOLIN SODIUM-DEXTROSE 2-4 GM/100ML-% IV SOLN
2.0000 g | INTRAVENOUS | Status: AC
  Administered 2023-08-10: 2 g via INTRAVENOUS
  Filled 2023-08-10: qty 100

## 2023-08-10 MED ORDER — ONDANSETRON HCL 4 MG/2ML IJ SOLN: 4.0000 mg | Freq: Four times a day (QID) | INTRAMUSCULAR | Status: DC | PRN

## 2023-08-10 MED ORDER — ROCURONIUM BROMIDE 10 MG/ML (PF) SYRINGE
PREFILLED_SYRINGE | INTRAVENOUS | Status: AC
  Filled 2023-08-10: qty 10

## 2023-08-10 MED ORDER — ROCURONIUM BROMIDE 10 MG/ML (PF) SYRINGE
PREFILLED_SYRINGE | INTRAVENOUS | Status: DC | PRN
  Administered 2023-08-10: 50 mg via INTRAVENOUS

## 2023-08-10 MED ORDER — ORAL CARE MOUTH RINSE: 15.0000 mL | Freq: Once | OROMUCOSAL | Status: AC

## 2023-08-10 MED ORDER — OXYCODONE HCL 5 MG/5ML PO SOLN: 5.0000 mg | Freq: Once | ORAL | Status: DC | PRN

## 2023-08-10 MED ORDER — ONDANSETRON HCL 4 MG/2ML IJ SOLN
INTRAMUSCULAR | Status: DC | PRN
  Administered 2023-08-10: 4 mg via INTRAVENOUS

## 2023-08-10 MED ORDER — ONDANSETRON HCL 4 MG/2ML IJ SOLN
INTRAMUSCULAR | Status: AC
  Filled 2023-08-10: qty 2

## 2023-08-10 MED ORDER — FENTANYL CITRATE PF 50 MCG/ML IJ SOSY
25.0000 ug | PREFILLED_SYRINGE | INTRAMUSCULAR | Status: DC | PRN
  Administered 2023-08-10: 50 ug via INTRAVENOUS

## 2023-08-10 MED ORDER — LIDOCAINE HCL (PF) 2 % IJ SOLN
INTRAMUSCULAR | Status: AC
  Filled 2023-08-10: qty 5

## 2023-08-10 MED ORDER — FENTANYL CITRATE PF 50 MCG/ML IJ SOSY
PREFILLED_SYRINGE | INTRAMUSCULAR | Status: AC
  Administered 2023-08-10: 100 ug
  Filled 2023-08-10: qty 2

## 2023-08-10 MED ORDER — DEXAMETHASONE SODIUM PHOSPHATE 10 MG/ML IJ SOLN
INTRAMUSCULAR | Status: AC
  Filled 2023-08-10: qty 1

## 2023-08-10 MED ORDER — LIDOCAINE HCL (PF) 2 % IJ SOLN
INTRAMUSCULAR | Status: DC | PRN
  Administered 2023-08-10: 50 mg via INTRADERMAL

## 2023-08-10 MED ORDER — SODIUM CHLORIDE 0.9 % IV SOLN: INTRAVENOUS | Status: DC

## 2023-08-10 MED ORDER — CHLORHEXIDINE GLUCONATE 0.12 % MT SOLN
15.0000 mL | Freq: Once | OROMUCOSAL | Status: AC
  Administered 2023-08-10: 15 mL via OROMUCOSAL

## 2023-08-10 MED ORDER — SODIUM CHLORIDE 0.9 % IR SOLN
Status: DC | PRN
  Administered 2023-08-10: 6000 mL

## 2023-08-10 MED ORDER — SUGAMMADEX SODIUM 200 MG/2ML IV SOLN
INTRAVENOUS | Status: DC | PRN
  Administered 2023-08-10: 200 mg via INTRAVENOUS

## 2023-08-10 MED ORDER — DEXAMETHASONE SODIUM PHOSPHATE 10 MG/ML IJ SOLN
INTRAMUSCULAR | Status: DC | PRN
  Administered 2023-08-10: 4 mg via INTRAVENOUS

## 2023-08-10 SURGICAL SUPPLY — 47 items
BAG COUNTER SPONGE SURGICOUNT (BAG) ×1 IMPLANT
BLADE EXCALIBUR 4.0X13 (MISCELLANEOUS) ×1 IMPLANT
BOOTIES KNEE HIGH SLOAN (MISCELLANEOUS) ×2 IMPLANT
BURR OVAL 8 FLU 4.0X13 (MISCELLANEOUS) ×1 IMPLANT
CANNULA DRILOCK 5.0X75 (CANNULA) ×1 IMPLANT
CANNULA TWIST IN 8.25X7CM (CANNULA) IMPLANT
CLSR STERI-STRIP ANTIMIC 1/2X4 (GAUZE/BANDAGES/DRESSINGS) IMPLANT
CONNECTOR 5 IN 1 STRAIGHT STRL (MISCELLANEOUS) ×1 IMPLANT
COOLER ICEMAN CLASSIC (MISCELLANEOUS) IMPLANT
DISSECTOR 3.8MM X 13CM (MISCELLANEOUS) ×1 IMPLANT
DRAPE INCISE 23X17 STRL (DRAPES) ×1 IMPLANT
DRAPE INCISE IOBAN 23X17 STRL (DRAPES) ×1 IMPLANT
DRAPE INCISE IOBAN 66X45 STRL (DRAPES) ×1 IMPLANT
DRAPE STERI 35X30 U-POUCH (DRAPES) ×1 IMPLANT
DRAPE SURG 17X11 SM STRL (DRAPES) ×1 IMPLANT
DRAPE SURG ORHT 6 SPLT 77X108 (DRAPES) ×2 IMPLANT
DRAPE U-SHAPE 47X51 STRL (DRAPES) ×1 IMPLANT
DURAPREP 26ML APPLICATOR (WOUND CARE) ×1 IMPLANT
EXCALIBUR 3.8MM X 13CM (MISCELLANEOUS) IMPLANT
GAUZE PAD ABD 8X10 STRL (GAUZE/BANDAGES/DRESSINGS) ×2 IMPLANT
GAUZE SPONGE 4X4 12PLY STRL (GAUZE/BANDAGES/DRESSINGS) ×1 IMPLANT
GLOVE BIO SURGEON STRL SZ7.5 (GLOVE) ×1 IMPLANT
GLOVE BIO SURGEON STRL SZ8 (GLOVE) ×1 IMPLANT
GLOVE SS BIOGEL STRL SZ 7 (GLOVE) ×1 IMPLANT
GLOVE SS BIOGEL STRL SZ 7.5 (GLOVE) ×1 IMPLANT
GOWN STRL SURGICAL XL XLNG (GOWN DISPOSABLE) ×2 IMPLANT
KIT BASIN OR (CUSTOM PROCEDURE TRAY) ×1 IMPLANT
KIT SHOULDER TRACTION (DRAPES) ×1 IMPLANT
KIT TURNOVER KIT A (KITS) ×1 IMPLANT
MANIFOLD NEPTUNE II (INSTRUMENTS) ×1 IMPLANT
NDL HD SCORPION MEGA LOADER (NEEDLE) IMPLANT
NS IRRIG 1000ML POUR BTL (IV SOLUTION) ×1 IMPLANT
PACK ARTHROSCOPY WL (CUSTOM PROCEDURE TRAY) ×1 IMPLANT
PAD ARMBOARD POSITIONER FOAM (MISCELLANEOUS) ×1 IMPLANT
PAD COLD SHLDR WRAP-ON (PAD) IMPLANT
SLING ARM FOAM STRAP LRG (SOFTGOODS) IMPLANT
SLING ARM FOAM STRAP MED (SOFTGOODS) IMPLANT
STRIP CLOSURE SKIN 1/2X4 (GAUZE/BANDAGES/DRESSINGS) ×1 IMPLANT
SUT MNCRL AB 3-0 PS2 18 (SUTURE) ×1 IMPLANT
SUT PDS AB 0 CT 36 (SUTURE) IMPLANT
SUT TIGER TAPE 7 IN WHITE (SUTURE) IMPLANT
TAPE FIBER 2MM 7IN #2 BLUE (SUTURE) IMPLANT
TAPE PAPER 3X10 WHT MICROPORE (GAUZE/BANDAGES/DRESSINGS) ×1 IMPLANT
TOWEL OR 17X26 10 PK STRL BLUE (TOWEL DISPOSABLE) ×1 IMPLANT
TUBING ARTHROSCOPY IRRIG 16FT (MISCELLANEOUS) ×1 IMPLANT
WAND ABLATOR APOLLO I90 (BUR) ×1 IMPLANT
WATER STERILE IRR 1000ML POUR (IV SOLUTION) ×1 IMPLANT

## 2023-08-10 NOTE — Anesthesia Procedure Notes (Signed)
 Anesthesia Regional Block: Interscalene brachial plexus block   Pre-Anesthetic Checklist: , timeout performed,  Correct Patient, Correct Site, Correct Laterality,  Correct Procedure, Correct Position, site marked,  Risks and benefits discussed,  Surgical consent,  Pre-op evaluation,  At surgeon's request and post-op pain management  Laterality: Right  Prep: chloraprep       Needles:  Injection technique: Single-shot  Needle Type: Echogenic Stimulator Needle     Needle Length: 5cm  Needle Gauge: 22     Additional Needles:   Procedures:, nerve stimulator,,,,,     Nerve Stimulator or Paresthesia:  Response: biceps flexion, 0.45 mA  Additional Responses:   Narrative:  Start time: 08/10/2023 11:01 AM End time: 08/10/2023 11:10 AM Injection made incrementally with aspirations every 5 mL.  Performed by: Personally  Anesthesiologist: Achille Rich, MD  Additional Notes: Functioning IV was confirmed and monitors were applied.  A 50mm 22ga Arrow echogenic stimulator needle was used. Sterile prep and drape,hand hygiene and sterile gloves were used.  Negative aspiration and negative test dose prior to incremental administration of local anesthetic. The patient tolerated the procedure well.  Ultrasound guidance: relevent anatomy identified, needle position confirmed, local anesthetic spread visualized around nerve(s), vascular puncture avoided.  Image printed for medical record.

## 2023-08-10 NOTE — Discharge Instructions (Signed)
   Kevin M. Supple, M.D., F.A.A.O.S. Orthopaedic Surgery Specializing in Arthroscopic and Reconstructive Surgery of the Shoulder [phone] [address] - [place], [place] [address] - Fax [phone]   POST-OP SHOULDER ARTHROSCOPY INSTRUCTIONS  1. Call the office at [phone] to schedule your first post-op appointment 7-10 days from the date of your surgery.  2. Leave the steri-strips in place over your incisions when performing dressing changes and showering. You may remove your dressings and begin showering 72 hours from surgery. You can expect drainage that is clear to bloody in nature that occasionally will soak through your dressings. If this occurs go ahead and perform a dressing change. The drainage should lessen daily and when there is no drainage from your incisions feel free to go without a dressing.  3. Wear your sling for comfort. You may come out of your sling for ad lib activity and even decide not to use the sling at all. If you find you are more comfortable in your sling, make sure you come out of your sling at least 3-4 times a day to do the exercises that are included below.  4. Range of motion to your elbow, wrist, and hand are encouraged 3-5 times daily. Exercise to your hand and fingers helps to reduce swelling you may experience.  5. Utilize ice to the shoulder 3-4 times minimum a day and additionally if you are experiencing pain.  6. You may drive when safely off narcotics and muscle relaxants.  7.Pain control following an exparel block  To help control your post-operative pain you received a nerve block  performed with Exparel which is a long acting anesthetic (numbing agent) which can provide pain relief and sensations of numbness (and relief of pain) in the operative shoulder and arm for up to 3 days. Sometimes it provides mixed relief, meaning you may still have numbness in certain areas of the arm but can still be able to move  parts of that arm,  hand, and fingers. We recommend that your prescribed pain medications  be used as needed. We do not feel it is necessary to "pre medicate" and "stay ahead" of pain.  Taking narcotic pain medications when you are not having any pain can lead to unnecessary and potentially dangerous side effects.    8. Pain medications can produce constipation along with their use. If you experience this, the use of an over the counter stool softener or laxative daily is recommended.   9. For additional questions or concerns, please do not hesitate to call the office. If after hours there is an answering service to forward your concerns to the physician on call.   POST-OP EXERCISES  The pendulum exercises should be performed while bending at the waist as far over as possible thereby letting gravity do the work for you.  Range of Motion Exercises: Pendulum (circular)  Repeat 20 times. Do 3 sessions per day.     Range of Motion Exercises: Pendulum (side-to-side)  Repeat 20 times. Do 3 sessions per day.    Range of Motion Exercises (self-stretching activities):  Slide arm up wall with palm toward you, moving closer to the wall. Hold for 5 seconds.  Repeat 10 times. Do 3 sessions per day.       

## 2023-08-10 NOTE — Transfer of Care (Signed)
 Immediate Anesthesia Transfer of Care Note  Patient: Valerie Yates  Procedure(s) Performed: Arthroscopy Shoulder with bursectomy and removal of suture anchor (Right: Shoulder)  Patient Location: PACU  Anesthesia Type:General  Level of Consciousness: awake, alert , and oriented  Airway & Oxygen Therapy: Patient Spontanous Breathing and Patient connected to face mask oxygen  Post-op Assessment: Report given to RN and Post -op Vital signs reviewed and stable  Post vital signs: Reviewed and stable  Last Vitals:  Vitals Value Taken Time  BP 177/103 08/10/23 1512  Temp 36.8   Pulse 70 08/10/23 1513  Resp 30 08/10/23 1513  SpO2 100 % 08/10/23 1513  Vitals shown include unfiled device data.  Last Pain:  Vitals:   08/10/23 1205  TempSrc:   PainSc: 0-No pain         Complications: No notable events documented.

## 2023-08-10 NOTE — H&P (Signed)
 Valerie Yates    Chief Complaint: Displaced suture anchor right shoulder HPI: The patient is a 48 y.o. female well-known to our practice with a remote history of right shoulder arthroscopic stabilization that we performed back in 2018, presents more recently following a work-related injury to the right shoulder in September 2024.  She failed conservative management and ultimately was sent for an MRI scan which showed evidence for a full-thickness rotator cuff tear.  She was taken to surgery 05/16/2023 where we performed a right shoulder arthroscopy with rotator cuff repair.  At the time of surgery she was noted to have significant osteoporosis and we took a modified approach in her postoperative elevation in consideration of the osteoporosis and concern for bone quality.  Unfortunately, she is now sustained displacement of a suture anchor and is brought to the operating today for planned right shoulder arthroscopy with removal of the suture anchor and possibly revision rotator cuff repair.  Patient does have a history of renal failure status post renal transplant in 2020.  She does remain on chronic steroids.  She is also in pain management.  Past Medical History:  Diagnosis Date   Anxiety    Arthritis    Complication of anesthesia    woke up before they took the endotra. tube and tries to take out per patient   ESRD (end stage renal disease) on dialysis (HCC)    Mon, Wed, Fri. at [facility] on [address]   GERD (gastroesophageal reflux disease)    Headache    "after dialysis" reports that only when excessive fluid is removed at dialysis   History of kidney infection    History of kidney stones    Hyperkalemia 09/05/2017, 12/18/2017   Hyperparathyroidism due to renal insufficiency (HCC)    Hypertension    Iron deficiency anemia    Migraine    "a few/year": (09/14/2015)   Shortness of breath dyspnea    resolved per patient was due to fluid overload   UTI (lower urinary tract infection)     recurrent      Past Surgical History:  Procedure Laterality Date   ABDOMINAL HYSTERECTOMY  2006   AV FISTULA PLACEMENT Left 01/27/2015   Procedure:  BASILIC VEIN TRANSPOSITION ;  Surgeon: Chuck Hint, MD;  Location: [facility] OR;  Service: Vascular;  Laterality: Left;   DILATION AND CURETTAGE OF UTERUS     IR DIALY SHUNT INTRO NEEDLE/INTRACATH INITIAL W/IMG LEFT Left 12/18/2017   KIDNEY TRANSPLANT     LAPAROSCOPIC CHOLECYSTECTOMY  2005   NM MYOVIEW LTD  08/2014   No fixed or reversible perfusion defect - to suggest Infarct or Ischeima.. EF ~70%.     SHOULDER ARTHROSCOPY W/ ROTATOR CUFF REPAIR Left 2012   SHOULDER ARTHROSCOPY WITH LABRAL REPAIR Right 08/11/2016   Procedure: RIGHT SHOULDER ARTHROSCOPY WITH LABRAL REPAIR;  Surgeon: Francena Hanly, MD;  Location: [facility] OR;  Service: Orthopedics;  Laterality: Right;   TRANSTHORACIC ECHOCARDIOGRAM  04/2014   Low normal EF of 50-55%. No RWMA.  Tr MR/TR.  NORMAL   TRANSTHORACIC ECHOCARDIOGRAM  10/27/2015   [facility] (in setting of volume overload - leading to HD): Mildly impaired LV function with EF of 45-50%. Pseudo-normal LV filling (GR 2 DD. Moderate MR and moderate TR. RVSP estimated at 52 mmHg.   TRANSTHORACIC ECHOCARDIOGRAM  03/2017   Mild LVH.  Mildly reduced EF of 45-50%.  GRII DD.  Mild MR.  Moderate LA dilation.  Mild TR (improved from moderate MR and TR on last echo,  suggestive of functional regurgitation due to LV dilation from volume overload   TUBAL LIGATION  2003    Family History  Problem Relation Age of Onset   Diabetes Maternal Grandmother    Cancer Mother        Breast   Breast cancer Mother    Heart disease Maternal Grandfather     Social History:  reports that she has never smoked. She has never used smokeless tobacco. She reports that she does not drink alcohol and does not use drugs.  BMI: Estimated body mass index is 24.03 kg/m as calculated from the following:   Height as of this encounter: 5\' 4"   (1.626 m).   Weight as of this encounter: 63.5 kg.    Diabetes: Patient does not have a diagnosis of diabetes.     Smoking Status:   reports that she has never smoked. She has never used smokeless tobacco.     Medications Prior to Admission  Medication Sig Dispense Refill   acetaminophen (TYLENOL) 650 MG CR tablet Take 650 mg by mouth every 8 (eight) hours as needed for pain.     ALPRAZolam (XANAX) 0.5 MG tablet Take 0.5 mg by mouth 3 (three) times daily.     carvedilol (COREG) 25 MG tablet Take 25 mg by mouth 2 (two) times daily with a meal.     mycophenolate (MYFORTIC) 180 MG EC tablet Take 360 mg by mouth 2 (two) times daily.     oxyCODONE-acetaminophen (PERCOCET) 5-325 MG tablet Take 1-2 tablets by mouth every 4 (four) hours as needed. (Patient taking differently: Take 1 tablet by mouth every 6 (six) hours as needed for moderate pain (pain score 4-6) or severe pain (pain score 7-10).) 40 tablet 0   predniSONE (DELTASONE) 5 MG tablet Take 5 mg by mouth daily with breakfast.     sulfamethoxazole-trimethoprim (BACTRIM) 400-80 MG tablet Take 1 tablet by mouth every Monday, Wednesday, and Friday.     tacrolimus (PROGRAF) 0.5 MG capsule Take 0.5-1 mg by mouth See admin instructions. Take 0.5 mg in the morning and 1 mg in the evening       Physical Exam: Patient is a pleasant, cooperative, somewhat chronically ill-appearing female in obvious discomfort secondary to right shoulder pain.  She has crepitance with passive motion.  Portals are well-healed.  No induration or ecchymosis.  Examination otherwise as noted at her multiple recent office visits.  Recent MRI scan confirms displacement of the anterior lateral row anchor.  Signal abnormalities noted within the rotator cuff tendons but no obvious displaced retracted full-thickness recurrent tear.  Vitals     Assessment/Plan  Impression: Displaced suture anchor right shoulder  Plan of Action: Procedure(s): ARTHROSCOPY, SHOULDER,  WITH ROTATOR CUFF REPAIR  Hennie Gosa M Dereck Agerton 08/10/2023, 9:34 AM Contact # [phone]

## 2023-08-10 NOTE — Op Note (Signed)
 08/10/2023  3:06 PM  PATIENT:   Valerie Yates  48 y.o. female  PRE-OPERATIVE DIAGNOSIS:  Displaced suture anchor right shoulder  POST-OPERATIVE DIAGNOSIS: Same  PROCEDURE: Right shoulder arthroscopy with bursectomy and removal of displaced suture anchor  SURGEON:  Trestin Vences, Vania Rea M.D.  ASSISTANTS: Ralene Bathe, PA-C  Ralene Bathe, PA-C was utilized as an Geophysicist/field seismologist throughout this case, essential for help with positioning the patient, positioning extremity, tissue manipulation, implantation of the prosthesis, suture management, wound closure, and intraoperative decision-making.  ANESTHESIA:   General Endotracheal and interscalene block with Exparel  EBL: Minimal  SPECIMEN: None  Drains: None   PATIENT DISPOSITION:  PACU - hemodynamically stable.    PLAN OF CARE: Discharge to home after PACU  Brief history:  Patient is a 48 year old female well-known to our practice status post recent right shoulder arthroscopy with rotator cuff repair performed 05/16/2023.  At that time she was noted to have significant osteoporosis related to her underlying chronic steroid use.  Postoperatively she has unfortunately had displacement of her anterior lateral row suture anchor which is creating mechanical abrasion and severe pain and she is brought to the operating this time for planned right show arthroscopy with anticipated anchor removal.  Preop the patient was counseled regarding treatment options as well as the potential risk versus benefits thereof.  Possible surgical complications were reviewed including the potential for bleeding, infection, neurovascular injury, persistence of pain, loss of motion, anesthetic complication, recurrence of rotator cuff tear, and possible need for additional surgery.  She understands, accepts, and agrees with our planned procedure.  Procedure detail:  After undergoing routine preop evaluation the patient received prophylactic antibiotics and interscalene block  with Exparel was established in the holding area by the anesthesia department.  Subsequently placed spine on the operating table and underwent the smooth induction of a general endotracheal anesthesia.  Turned to the left lateral decubitus position on a beanbag and appropriately padded and protected.  Right shoulder was examined demonstrating prominence of the displaced suture anchor off the anterolateral corner of the acromion.  The arm was suspended at 70 degrees of abduction with 15 pounds of traction in the right shoulder girdle region was sterilely prepped and draped in standard fashion.  Timeout was called.  The arthroscope was introduced into the subacromial space of the posterior portal and a direct lateral portal established in the subacromial space.  Bursectomy was performed and inspection of the previous rotator cuff repair demonstrated that all surfaces of the repair site were nicely covered by a layer of bursal tissue with no obvious displaced suture material or obvious full-thickness recurrent rotator cuff tear.  We then dissected laterally distally and identified the prominent suture anchor which had been involved by some soft tissues and these were carefully dissected free and the suture anchor was then grasped and was asked to treat as 2 pieces without difficulty.  Bursectomy was then completed.  Hemostasis was obtained.  We then assessed the rotator cuff and found the tissues to be in appropriate position for continued healing with no obvious full-thickness displaced tears.  Fluid and cyst removed.  The portals were closed with a Monocryl and a Steri-Strip.  A dry dressing taped about the right shoulder right arm placed in a sling immobilizer and the patient was awakened, extubated, and taken to the recovery in stable condition.  Senaida Lange MD     Contact # [phone]

## 2023-08-10 NOTE — Anesthesia Procedure Notes (Signed)
 Procedure Name: Intubation Date/Time: 08/10/2023 2:11 PM  Performed by: Sindy Guadeloupe, CRNAPre-anesthesia Checklist: Patient identified, Emergency Drugs available, Suction available, Patient being monitored and Timeout performed Patient Re-evaluated:Patient Re-evaluated prior to induction Oxygen Delivery Method: Circle system utilized Preoxygenation: Pre-oxygenation with 100% oxygen Induction Type: IV induction Ventilation: Mask ventilation without difficulty Laryngoscope Size: Mac and 4 Grade View: Grade I Tube type: Oral Number of attempts: 1 Airway Equipment and Method: Stylet Placement Confirmation: ETT inserted through vocal cords under direct vision, positive ETCO2 and breath sounds checked- equal and bilateral Secured at: 22 cm Tube secured with: Tape Dental Injury: Teeth and Oropharynx as per pre-operative assessment

## 2023-08-11 ENCOUNTER — Encounter (HOSPITAL_COMMUNITY): Payer: Self-pay | Admitting: Orthopedic Surgery

## 2023-08-11 NOTE — Anesthesia Postprocedure Evaluation (Signed)
 Anesthesia Post Note  Patient: Valerie Yates  Procedure(s) Performed: Arthroscopy Shoulder with bursectomy and removal of suture anchor (Right: Shoulder)     Patient location during evaluation: PACU Anesthesia Type: General and Regional Level of consciousness: awake and alert Pain management: pain level controlled Vital Signs Assessment: post-procedure vital signs reviewed and stable Respiratory status: spontaneous breathing, nonlabored ventilation, respiratory function stable and patient connected to nasal cannula oxygen Cardiovascular status: blood pressure returned to baseline and stable Postop Assessment: no apparent nausea or vomiting Anesthetic complications: no   No notable events documented.  Last Vitals:  Vitals:   08/10/23 1600 08/10/23 1610  BP: (!) 164/96 (!) 177/99  Pulse: 68 73  Resp: 20   Temp:  36.5 C  SpO2:      Last Pain:  Vitals:   08/10/23 1611  TempSrc:   PainSc: 0-No pain                 [name]

## 2024-01-31 ENCOUNTER — Telehealth: Payer: Self-pay

## 2024-01-31 NOTE — Telephone Encounter (Signed)
 Per Dr. Luiz called [facility] to schedule new pt appointment with Dr. Dea 9/25 at 3. Left voicemail requesting call back to setup appt. Pt referred by Charmaine the Nephrology NP from [facility]. Lorenda CHRISTELLA Code, RMA

## 2024-01-31 NOTE — Telephone Encounter (Signed)
 2nd attempt to reach patient. Not able to reach at this time.  Valerie Yates Code, RMA

## 2024-02-01 ENCOUNTER — Ambulatory Visit: Admitting: Infectious Diseases

## 2024-02-01 ENCOUNTER — Other Ambulatory Visit: Payer: Self-pay

## 2024-02-01 ENCOUNTER — Encounter: Payer: Self-pay | Admitting: Infectious Diseases

## 2024-02-01 VITALS — BP 119/83 | HR 115 | Temp 98.2°F | Ht 65.0 in | Wt 135.0 lb

## 2024-02-01 DIAGNOSIS — D849 Immunodeficiency, unspecified: Secondary | ICD-10-CM | POA: Diagnosis not present

## 2024-02-01 DIAGNOSIS — Z79899 Other long term (current) drug therapy: Secondary | ICD-10-CM

## 2024-02-01 DIAGNOSIS — Z940 Kidney transplant status: Secondary | ICD-10-CM

## 2024-02-01 DIAGNOSIS — B029 Zoster without complications: Secondary | ICD-10-CM

## 2024-02-01 NOTE — Progress Notes (Signed)
 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   ALPRAZOLAM  (XANAX ) 0.5 MG TABLET    Take 0.5 mg by mouth 3 (three) times daily.   CARVEDILOL  (COREG ) 25 MG TABLET    Take 25 mg by mouth 2 (two) times daily with a meal.   CYCLOBENZAPRINE  (FLEXERIL ) 10 MG TABLET    Take 1 tablet (10 mg total) by mouth 3 (three) times daily as needed for muscle spasms.   HYDROMORPHONE  (DILAUDID ) 2 MG TABLET    Take 1 tablet (2 mg total) by mouth every 4 (four) hours as needed for severe pain (pain score 7-10) (not controlled by baseline meds).   MYCOPHENOLATE (MYFORTIC) 180 MG EC TABLET    Take 360 mg by mouth 2 (two) times daily.   ONDANSETRON  (ZOFRAN ) 4 MG TABLET    Take 1 tablet (4 mg total) by mouth every 8 (eight) hours as needed for nausea or vomiting.   OXYCODONE -ACETAMINOPHEN  (PERCOCET) 5-325 MG TABLET    Take 1-2 tablets by mouth every 4 (four) hours as needed.   PREDNISONE (DELTASONE) 5 MG TABLET    Take 5 mg by mouth daily with breakfast.   SULFAMETHOXAZOLE-TRIMETHOPRIM (BACTRIM) 400-80 MG TABLET    Take 1 tablet by mouth every Monday, Wednesday, and Friday.   TACROLIMUS (PROGRAF) 0.5 MG CAPSULE    Take 0.5-1 mg by mouth See admin instructions. Take 0.5 mg in the morning and 1 mg  in the evening  Modified Medications   No medications on file  Discontinued Medications   No medications on file    Subjective: Discussed the use of AI scribe software for clinical note transcription with the patient, who gave verbal consent to proceed.   48 year old female with prior history of ESRD>renal transplant on immunosuppressives -prednisone 5 mg daily, tacrolimus 0.5 mg a.m. and 1 mg p.m, mycophenolate 360 mg twice daily and Bactrim Ppx, HTN who is referred from Dr Luiz for concerns for shingles.   Reports painful rash began between Sunday night and Monday morning, localized to left side of the chest, and significantly affected her sleep and appetite. She started taking famciclovir, starting last night. She has taken two doses of 500 mg each, to be taken three times a day for 7 days as well as Lyrica for pain management.  She reports a reaction to Valacyclovir in April 2020, when she had ? shingles causing hyperactivity and elevated blood pressure. She is concerned about shingles recurrence due to her immunocompromised status.  Denies fever, chills.  Denies, nausea, vomiting or diarrhea.  Denies chest pain, cough or shortness of breath.  Denies rashes at other parts of the body, joint pain or GU concerns.  However has significant pain in the area of rash.  Denies headache, blurry vision or neurological concerns.   Review of Systems: All systems reviewed with pertinent positive and negative as listed above  Past Medical History:  Diagnosis Date   Anxiety    Arthritis    Complication of anesthesia    woke up before they took the endotra. tube and tries to take out per patient   ESRD (end stage renal disease) on dialysis (HCC)    Mon, Wed, Fri. at [facility] on [address]   GERD (gastroesophageal reflux disease)    Headache    after dialysis reports that only when excessive fluid is removed at dialysis   History of kidney infection    History of kidney stones     Hyperkalemia 09/05/2017, 12/18/2017   Hyperparathyroidism due to renal insufficiency    Hypertension    Iron deficiency anemia    Migraine    a few/year: (09/14/2015)   Shortness of breath dyspnea    resolved per patient was due to fluid overload   UTI (lower urinary tract infection)    recurrent   Past Surgical History:  Procedure Laterality Date   ABDOMINAL HYSTERECTOMY  2006   AV FISTULA PLACEMENT Left 01/27/2015   Procedure:  BASILIC VEIN TRANSPOSITION ;  Surgeon: Lonni GORMAN Blade, MD;  Location: [facility] OR;  Service: Vascular;  Laterality: Left;   DILATION AND CURETTAGE OF UTERUS     IR DIALY SHUNT INTRO NEEDLE/INTRACATH INITIAL W/IMG LEFT Left 12/18/2017   KIDNEY TRANSPLANT     LAPAROSCOPIC CHOLECYSTECTOMY  2005   NM MYOVIEW  LTD  08/2014   No fixed or reversible perfusion defect - to suggest Infarct or Ischeima.. EF ~70%.     SHOULDER ARTHROSCOPY W/ ROTATOR CUFF REPAIR Left 2012   SHOULDER ARTHROSCOPY WITH LABRAL REPAIR Right 08/11/2016   Procedure: RIGHT SHOULDER ARTHROSCOPY WITH LABRAL REPAIR;  Surgeon: Franky Pointer, MD;  Location: [facility] OR;  Service: Orthopedics;  Laterality: Right;   SHOULDER ARTHROSCOPY WITH ROTATOR CUFF REPAIR Right 08/10/2023   Procedure: Arthroscopy Shoulder with bursectomy and removal of suture anchor;  Surgeon: Pointer Franky, MD;  Location: [facility] ORS;  Service: Orthopedics;  Laterality: Right;   TRANSTHORACIC ECHOCARDIOGRAM  04/2014   Low normal EF of 50-55%. No RWMA.  Tr MR/TR.  NORMAL   TRANSTHORACIC ECHOCARDIOGRAM  10/27/2015   [facility] (in setting of volume overload - leading to HD): Mildly impaired LV function with EF of 45-50%. Pseudo-normal LV filling (GR 2 DD. Moderate MR and moderate TR. RVSP estimated at 52 mmHg.   TRANSTHORACIC ECHOCARDIOGRAM  03/2017   Mild LVH.  Mildly reduced EF of 45-50%.  GRII DD.  Mild MR.  Moderate LA dilation.  Mild TR (improved from moderate MR and TR on last echo, suggestive of functional regurgitation due to LV  dilation from volume overload   TUBAL LIGATION  2003    Social History   Tobacco Use   Smoking status: Never   Smokeless tobacco: Never  Vaping Use   Vaping status: Never Used  Substance Use Topics   Alcohol use: No    Alcohol/week: 0.0 standard drinks of alcohol   Drug use: No    Family History  Problem Relation Age of Onset   Diabetes Maternal Grandmother    Cancer Mother        Breast   Breast cancer Mother    Heart disease Maternal Grandfather     Allergies  Allergen Reactions   Azithromycin Anaphylaxis and Rash   Doxercalciferol Itching    Other Reaction(s): Itching  Erythromycin Hives, Shortness Of Breath and Rash    Acne Aid-Dermatological. Acne Aid-Dermatological.   Hydralazine      Other Reaction(s): Not available, Unknown  hydralazine    Penicillins Anaphylaxis, Hives and Shortness Of Breath    Has patient had a PCN reaction causing immediate rash, facial/tongue/throat swelling, SOB or lightheadedness with hypotension: #  #  #  YES  #  #  #  Has patient had a PCN reaction causing SEVERE RASH INVOLVING MUCUS MEMBRANES or SKIN NECROSIS: #  #  #  YES  #  #  #  Has patient had a PCN reaction that required hospitalization #  #  #  YES  #  #  #  Has patient had a PCN reaction occurring within the last 10 years:  #  #  #  NO  #  #  #   Has patient had a PCN reaction causing immediate rash, facial/tongue/throat swelling, SOB or lightheadedness with hypotension: #  #  #  YES  #  #  #  Has patient had a PCN reaction causing SEVERE RASH INVOLVING MUCUS MEMBRANES or SKIN NECROSIS: #  #  #  YES  #  #  #  Has patient had a PCN reaction that required hospitalization #  #  #  YES  #  #  #  Has patient had a PCN reaction occurring within the last 10 years:  #  #  #  NO  #  #  #    Amlodipine    Gabapentin     Other Reaction(s): Hives   Valsartan    Morphine Sulfate Rash    Tolerates with Benadryl      Health Maintenance  Topic Date Due   COVID-19 Vaccine (1) Never  done   Hepatitis C Screening  Never done   DTaP/Tdap/Td (1 - Tdap) Never done   Pneumococcal Vaccine (1 of 2 - PCV) Never done   Cervical Cancer Screening (HPV/Pap Cotest)  Never done   Mammogram  Never done   Colonoscopy  06/09/2021   Influenza Vaccine  Never done   Hepatitis B Vaccines 19-59 Average Risk  Completed   HIV Screening  Completed   HPV VACCINES  Aged Out   Meningococcal B Vaccine  Aged Out    Objective: BP 119/83   Pulse (!) 115   Temp 98.2 F (36.8 C) (Oral)   Ht 5' 5 (1.651 m)   Wt 135 lb (61.2 kg)   SpO2 100%   BMI 22.47 kg/m    Physical Exam Constitutional:      Appearance: Normal appearance.  HENT:     Head: Normocephalic and atraumatic.      Mouth: Mucous membranes are moist.  Eyes:    Conjunctiva/sclera: Conjunctivae normal.     Pupils: Pupils are equal, round, and b/l symmetrical    Cardiovascular:     Rate and Rhythm: Normal rate and regular rhythm.     Heart sounds: s1s2  Pulmonary:     Effort: Pulmonary effort is normal.     Breath sounds: Normal breath sounds.   Abdominal:     General: Non distended     Palpations: soft.   Musculoskeletal:        General: Normal range of motion.   Skin:    General: Skin is warm and dry.     Comments: Chaperoned for exam     Neurological:     General: grossly non focal     Mental  Status: awake, alert and oriented to person, place, and time.   Psychiatric:        Mood and Affect: Mood normal.   Lab Results Lab Results  Component Value Date   WBC 4.0 12/19/2017   HGB 9.5 (L) 12/19/2017   HCT 30.0 (L) 12/19/2017   MCV 98.0 12/19/2017   PLT 156 12/19/2017    Lab Results  Component Value Date   CREATININE 5.65 (H) 12/19/2017   BUN 22 (H) 12/19/2017   NA 139 12/19/2017   K 4.7 12/19/2017   CL 96 (L) 12/19/2017   CO2 31 12/19/2017    Lab Results  Component Value Date   ALT 19 12/19/2017   AST 25 12/19/2017   ALKPHOS 96 12/19/2017   BILITOT 1.0 12/19/2017    No results found  for: CHOL, HDL, LDLCALC, LDLDIRECT, TRIG, CHOLHDL No results found for: LABRPR, RPRTITER No results found for: HIV1RNAQUANT, HIV1RNAVL, CD4TABS    Assessment/Plan # Localized shingles of left chest wall - no signs of dissemination or b/l involvement.  No neurological or visual concerns. - Will dose adjust famciclovir to 500 mg twice daily based on her last creatinine in August 2025 which she showed me in her phone. She has enough pills.  - She is hesitant to go to the ED but uwilling to give 1 day trial of antivirals.  If worsening she is aware to go to the ED - Follow-up to be decided pending above   I spent 60 minutes involved in face-to-face and non-face-to-face activities for this patient on the day of the visit. Professional time spent includes the following activities: Preparing to see the patient (review of tests), Obtaining and reviewing separately obtained history, Performing a medically appropriate examination and evaluation, referring and communicating with other health care professionals, Documenting clinical information in the EMR, Independently interpreting results (not separately reported), Communicating results to the patient/husband, Counseling and educating the patient/husband and Care coordination (not separately reported).   Of note, portions of this note may have been created with voice recognition software. While this note has been edited for accuracy, occasional wrong-word or 'sound-a-like' substitutions may have occurred due to the inherent limitations of voice recognition software.   Annalee Joseph, MD [facility] 02/01/2024, 10:01 AM

## 2024-02-02 DIAGNOSIS — Z79899 Other long term (current) drug therapy: Secondary | ICD-10-CM | POA: Insufficient documentation

## 2024-02-02 DIAGNOSIS — D849 Immunodeficiency, unspecified: Secondary | ICD-10-CM | POA: Insufficient documentation

## 2024-02-02 DIAGNOSIS — Z940 Kidney transplant status: Secondary | ICD-10-CM | POA: Insufficient documentation

## 2024-02-05 ENCOUNTER — Telehealth: Payer: Self-pay

## 2024-02-05 NOTE — Telephone Encounter (Signed)
 Spoke with patient and advise to contact her HR department to request FMLA for documentation. Will also route to provider to see if willing to write detailed letter explaining why patient may have missed work related to ID treatment  Valerie Kleine, LPN

## 2024-02-05 NOTE — Telephone Encounter (Signed)
 Patient called stating she had received a generic letter to excuse her from work but employer is needing a letter explaining the reason and the time she needs to be out of work. Patient said letter can be as detailed as possible, unable to return to work at this time due to pain and blisters. Send to Email at [email or web address], best contact number for patient is [phone].

## 2024-02-08 ENCOUNTER — Telehealth: Payer: Self-pay

## 2024-02-08 MED ORDER — FAMCICLOVIR 500 MG PO TABS: 500.0000 mg | ORAL_TABLET | Freq: Two times a day (BID) | ORAL | 0 refills | Status: AC

## 2024-02-08 NOTE — Telephone Encounter (Signed)
 Patient called stating she completed 10 days of the famciclovir that was prescribed by nephrology.  She is asking should she pick of the prescription that you sent in as well for famciclovir and take that.  She reports her shingles out break is still not clear up and still painful.  Please advise Valerie Yates, CMA

## 2024-02-08 NOTE — Telephone Encounter (Signed)
 If not cleared up, yes.

## 2024-02-15 ENCOUNTER — Telehealth: Payer: Self-pay

## 2024-02-15 NOTE — Telephone Encounter (Signed)
 Patient called, needs provider to fill out STD paperwork for recent shingles episode. Her employer put her on a 30-day leave automatically. Provided her with triage fax number.   Valerie Yates, BSN, RN

## 2024-02-19 NOTE — Telephone Encounter (Signed)
 Can do visit with another provider. Thx.

## 2024-02-19 NOTE — Telephone Encounter (Signed)
 You do not have availability until mid to late November.

## 2024-02-19 NOTE — Telephone Encounter (Signed)
 Patient called to follow up on forms needing them filled out asap. Iinformed provider isn't in the office but I will send a message to provider.   Valerie Yates Valerie Yates Letters, CMA

## 2024-02-20 ENCOUNTER — Telehealth: Payer: Self-pay | Admitting: Infectious Diseases

## 2024-02-20 NOTE — Telephone Encounter (Signed)
 Forms not received. Will send mychart message requesting forms be faxed or emailed.  Valerie Yates Code, RMA

## 2024-02-20 NOTE — Telephone Encounter (Signed)
 I called Valerie Yates to schedule next available appt with a provider to complete paperwork. Pt stated she spoke with a nurse who received and was going to send disability paperwork to [facility] so she can get paid while out. She understands STD paperwork will need an appt but was under the impression her disability paperwork would be completed and sent back asap. Luana is scheduled 10/23 with Luiz and can be reached at [phone].

## 2024-02-29 ENCOUNTER — Ambulatory Visit: Admitting: Internal Medicine
